# Patient Record
Sex: Female | Born: 1950 | Race: Black or African American | Hispanic: No | Marital: Married | Smoking: Former smoker
Health system: Southern US, Community
[De-identification: ages and names within clinical notes are randomized; demographics above are authoritative.]

## PROBLEM LIST (undated history)

## (undated) DIAGNOSIS — R0600 Dyspnea, unspecified: Secondary | ICD-10-CM

## (undated) DIAGNOSIS — I10 Essential (primary) hypertension: Secondary | ICD-10-CM

## (undated) DIAGNOSIS — E109 Type 1 diabetes mellitus without complications: Secondary | ICD-10-CM

## (undated) DIAGNOSIS — I509 Heart failure, unspecified: Secondary | ICD-10-CM

## (undated) DIAGNOSIS — R32 Unspecified urinary incontinence: Secondary | ICD-10-CM

## (undated) DIAGNOSIS — F419 Anxiety disorder, unspecified: Secondary | ICD-10-CM

## (undated) DIAGNOSIS — N189 Chronic kidney disease, unspecified: Secondary | ICD-10-CM

## (undated) DIAGNOSIS — K219 Gastro-esophageal reflux disease without esophagitis: Secondary | ICD-10-CM

## (undated) DIAGNOSIS — N390 Urinary tract infection, site not specified: Secondary | ICD-10-CM

## (undated) DIAGNOSIS — E119 Type 2 diabetes mellitus without complications: Secondary | ICD-10-CM

## (undated) DIAGNOSIS — K5900 Constipation, unspecified: Secondary | ICD-10-CM

## (undated) DIAGNOSIS — I639 Cerebral infarction, unspecified: Secondary | ICD-10-CM

## (undated) DIAGNOSIS — I499 Cardiac arrhythmia, unspecified: Secondary | ICD-10-CM

## (undated) DIAGNOSIS — IMO0002 Reserved for concepts with insufficient information to code with codable children: Secondary | ICD-10-CM

## (undated) DIAGNOSIS — IMO0001 Reserved for inherently not codable concepts without codable children: Secondary | ICD-10-CM

## (undated) HISTORY — DX: Cardiac arrhythmia, unspecified: I49.9

## (undated) HISTORY — PX: HAND SURGERY: SHX662

## (undated) HISTORY — DX: Type 1 diabetes mellitus without complications: E10.9

## (undated) HISTORY — DX: Heart failure, unspecified: I50.9

## (undated) HISTORY — DX: Gastro-esophageal reflux disease without esophagitis: K21.9

## (undated) HISTORY — DX: Chronic kidney disease, unspecified: N18.9

## (undated) HISTORY — DX: Anxiety disorder, unspecified: F41.9

## (undated) HISTORY — DX: Type 2 diabetes mellitus without complications: E11.9

## (undated) HISTORY — DX: Unspecified urinary incontinence: R32

## (undated) HISTORY — DX: Essential (primary) hypertension: I10

## (undated) HISTORY — DX: Cerebral infarction, unspecified: I63.9

## (undated) HISTORY — DX: Reserved for inherently not codable concepts without codable children: IMO0001

## (undated) HISTORY — PX: OTHER SURGICAL HISTORY: SHX169

## (undated) HISTORY — DX: Urinary tract infection, site not specified: N39.0

## (undated) HISTORY — DX: Reserved for concepts with insufficient information to code with codable children: IMO0002

## (undated) HISTORY — DX: Constipation, unspecified: K59.00

---

## 1983-03-05 HISTORY — PX: TUBAL LIGATION: SHX77

## 1997-03-04 HISTORY — PX: HIP SURGERY: SHX245

## 1999-03-05 DIAGNOSIS — I639 Cerebral infarction, unspecified: Secondary | ICD-10-CM

## 1999-03-05 HISTORY — DX: Cerebral infarction, unspecified: I63.9

## 2003-12-28 ENCOUNTER — Other Ambulatory Visit: Payer: Self-pay

## 2003-12-28 ENCOUNTER — Ambulatory Visit: Payer: Self-pay | Admitting: Specialist

## 2003-12-30 ENCOUNTER — Ambulatory Visit: Payer: Self-pay | Admitting: Specialist

## 2004-04-05 ENCOUNTER — Ambulatory Visit: Payer: Self-pay | Admitting: Specialist

## 2004-04-10 ENCOUNTER — Ambulatory Visit: Payer: Self-pay | Admitting: Specialist

## 2004-08-31 ENCOUNTER — Other Ambulatory Visit: Payer: Self-pay

## 2004-08-31 ENCOUNTER — Ambulatory Visit: Payer: Self-pay | Admitting: Specialist

## 2004-09-07 ENCOUNTER — Ambulatory Visit: Payer: Self-pay | Admitting: Specialist

## 2005-12-24 ENCOUNTER — Emergency Department: Payer: Self-pay | Admitting: General Practice

## 2007-08-06 ENCOUNTER — Ambulatory Visit: Payer: Self-pay | Admitting: Family Medicine

## 2008-03-29 ENCOUNTER — Emergency Department: Payer: Self-pay | Admitting: Emergency Medicine

## 2008-03-31 ENCOUNTER — Ambulatory Visit: Payer: Self-pay | Admitting: Emergency Medicine

## 2008-04-15 ENCOUNTER — Ambulatory Visit: Payer: Self-pay | Admitting: Family Medicine

## 2008-04-25 ENCOUNTER — Ambulatory Visit: Payer: Self-pay | Admitting: Family Medicine

## 2008-05-09 ENCOUNTER — Ambulatory Visit: Payer: Self-pay | Admitting: Family Medicine

## 2008-11-08 ENCOUNTER — Ambulatory Visit: Payer: Self-pay | Admitting: Family Medicine

## 2009-02-22 ENCOUNTER — Emergency Department: Payer: Self-pay | Admitting: Internal Medicine

## 2009-03-10 ENCOUNTER — Ambulatory Visit: Payer: Self-pay | Admitting: Specialist

## 2009-04-10 ENCOUNTER — Emergency Department: Payer: Self-pay | Admitting: Emergency Medicine

## 2009-10-19 ENCOUNTER — Ambulatory Visit: Payer: Self-pay | Admitting: Specialist

## 2009-11-26 ENCOUNTER — Inpatient Hospital Stay: Payer: Self-pay | Admitting: Internal Medicine

## 2009-12-09 ENCOUNTER — Inpatient Hospital Stay: Payer: Self-pay | Admitting: Internal Medicine

## 2009-12-15 ENCOUNTER — Ambulatory Visit: Payer: Self-pay | Admitting: Family

## 2010-01-24 ENCOUNTER — Ambulatory Visit: Payer: Self-pay | Admitting: Family

## 2010-02-08 ENCOUNTER — Ambulatory Visit: Payer: Self-pay | Admitting: Family Medicine

## 2010-02-22 ENCOUNTER — Ambulatory Visit: Payer: Self-pay | Admitting: Family

## 2010-09-11 ENCOUNTER — Inpatient Hospital Stay: Payer: Self-pay | Admitting: *Deleted

## 2011-09-09 IMAGING — CR DG CHEST 2V
1 series · 2 of 2 positions shown · non-contrast
Comparison: none

REASON FOR EXAM: f/u CHF
COMMENTS:

[Series 1: view not recorded · 0.17mm/px · 2 of 2 slices shown]
[im 1/2]
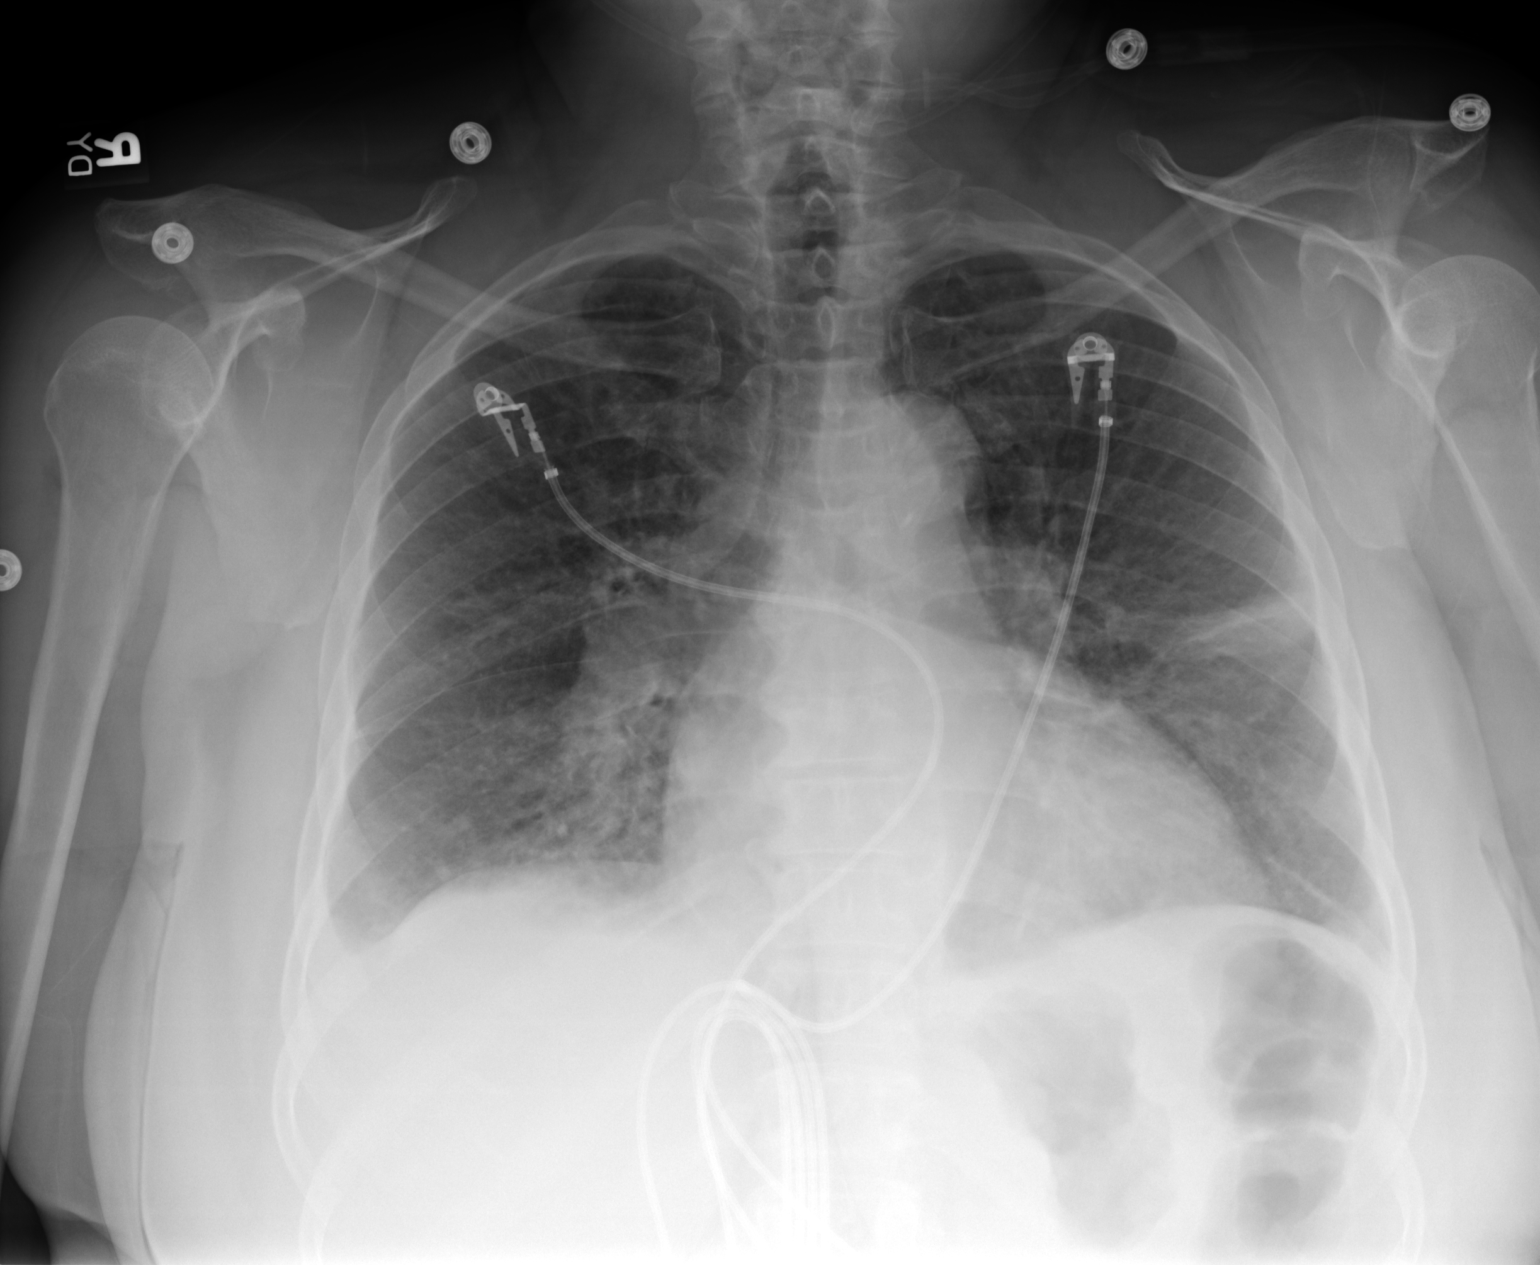
[im 2/2]
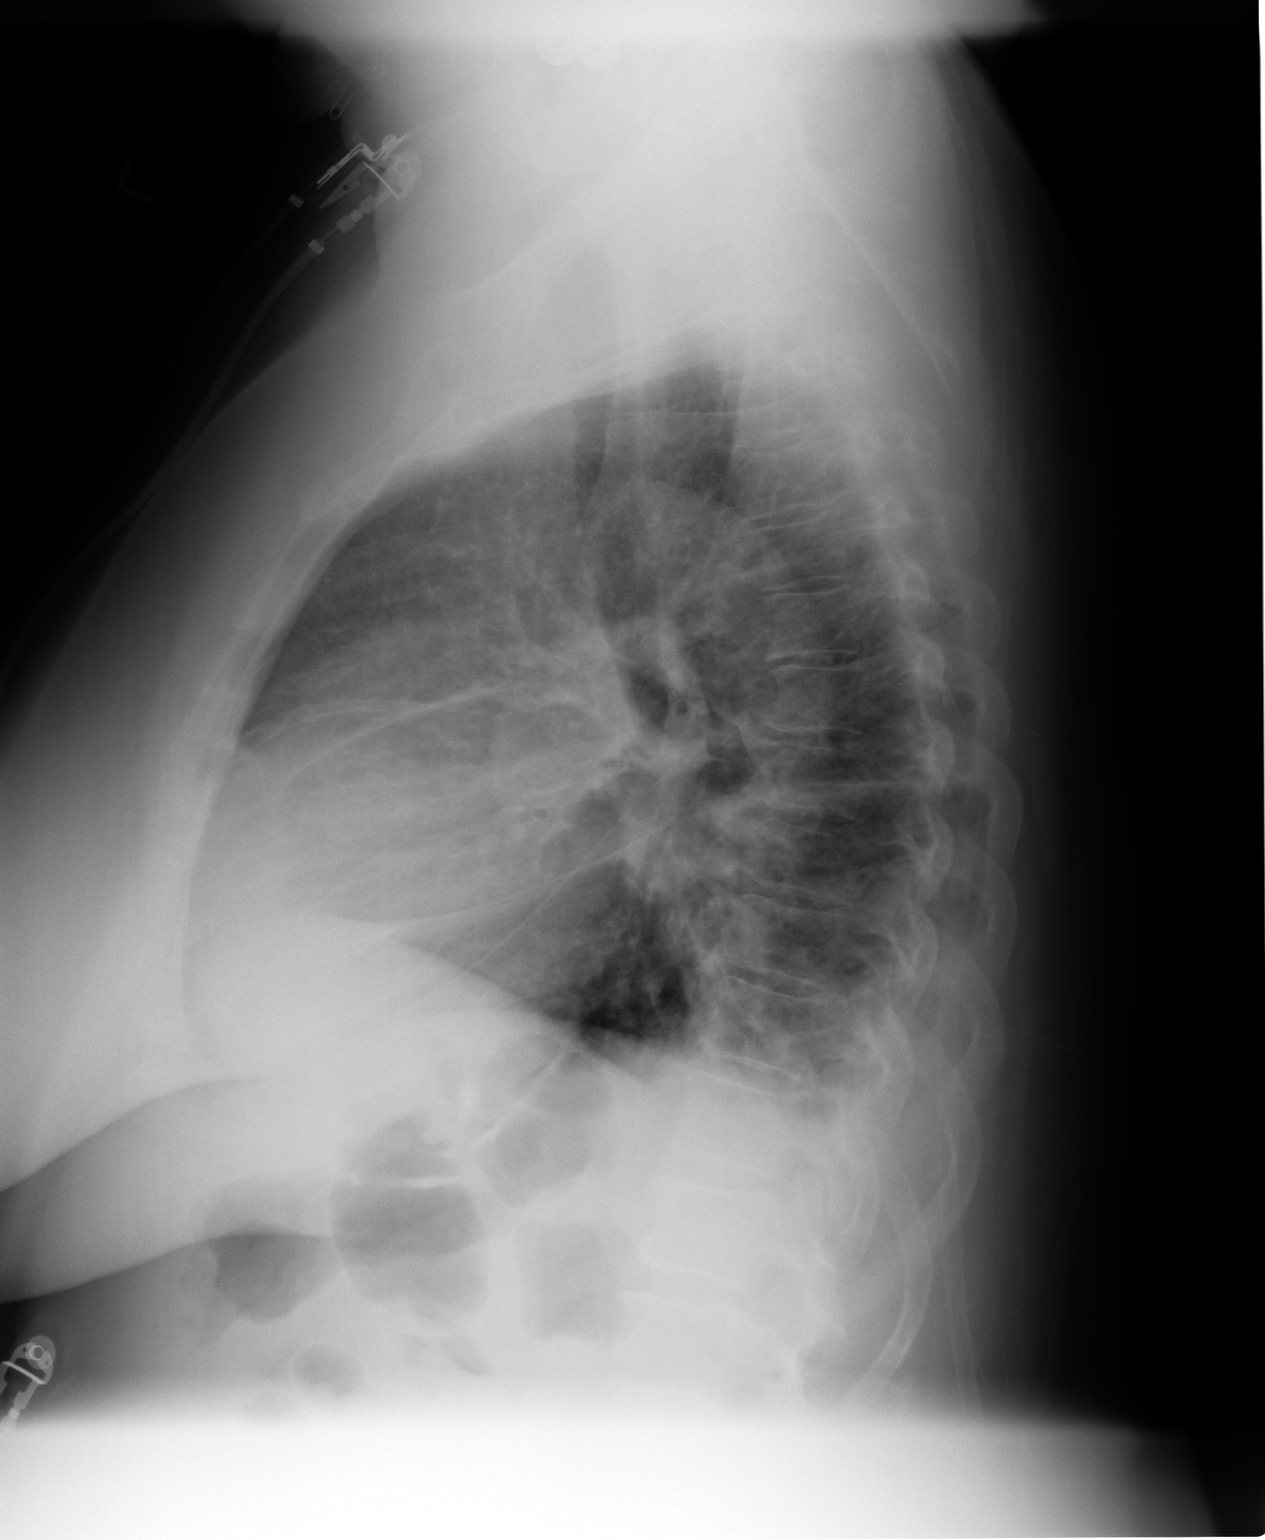

[2 of 2 positions shown; findings below may reference images not displayed]

PROCEDURE:     DXR - DXR CHEST PA (OR AP) AND LATERAL  - November 27, 2009  [DATE]

RESULT:     Comparison is made to the previous study of 11/26/2009.

There is persistent but improved pulmonary vascular congestion and pulmonary
edema. There is a band of density in the left mid lung which could represent
atelectasis or infiltrate. There is no significant effusion. There is
blunting of the posterior gutters and costophrenic angles with increased
density in the retrocardiac region on the lateral view.
IMPRESSION: Pulmonary edema is improving. There are trace pleural
effusions. There is right lower lobe and superior segment left lower lobe
infiltrate present.

## 2011-10-05 ENCOUNTER — Emergency Department: Payer: Self-pay | Admitting: Emergency Medicine

## 2012-03-04 HISTORY — PX: MULTIPLE TOOTH EXTRACTIONS: SHX2053

## 2012-06-16 ENCOUNTER — Ambulatory Visit: Payer: Self-pay | Admitting: Family Medicine

## 2012-06-24 IMAGING — CR DG FOOT COMPLETE 3+V*L*
1 series · 3 of 3 positions shown · non-contrast
Comparison: none

REASON FOR EXAM: pain plantar aspect left  foot , supratherapeutic INR
COMMENTS:

PROCEDURE:     DXR - DXR FOOT LT COMP W/OBLIQUES  - September 12, 2010  [DATE]
RESULT:     No fracture, dislocation or other acute bony abnormality is
identified. In the lateral view, there are incidentally noted small plantar
and Achilles calcaneal spurs.

[Series 1: view not recorded · 0.17mm/px · 3 of 3 slices shown]
[im 1/3]
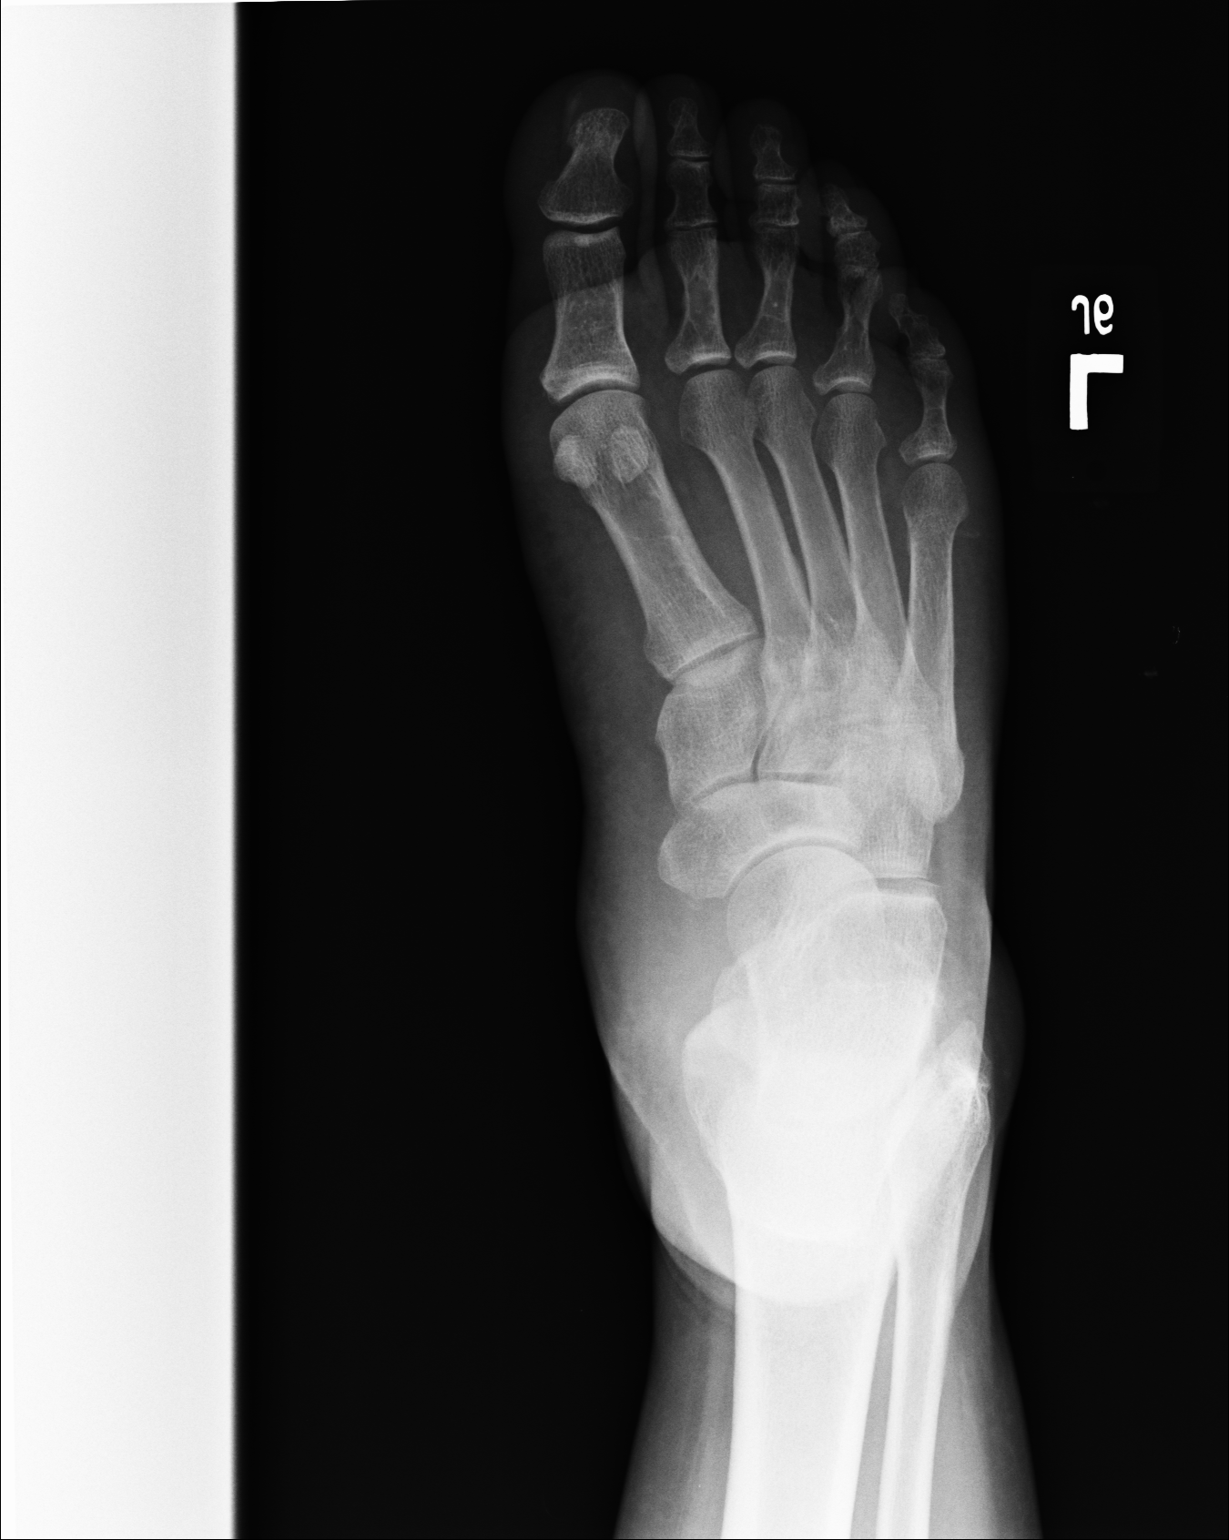
[im 2/3]
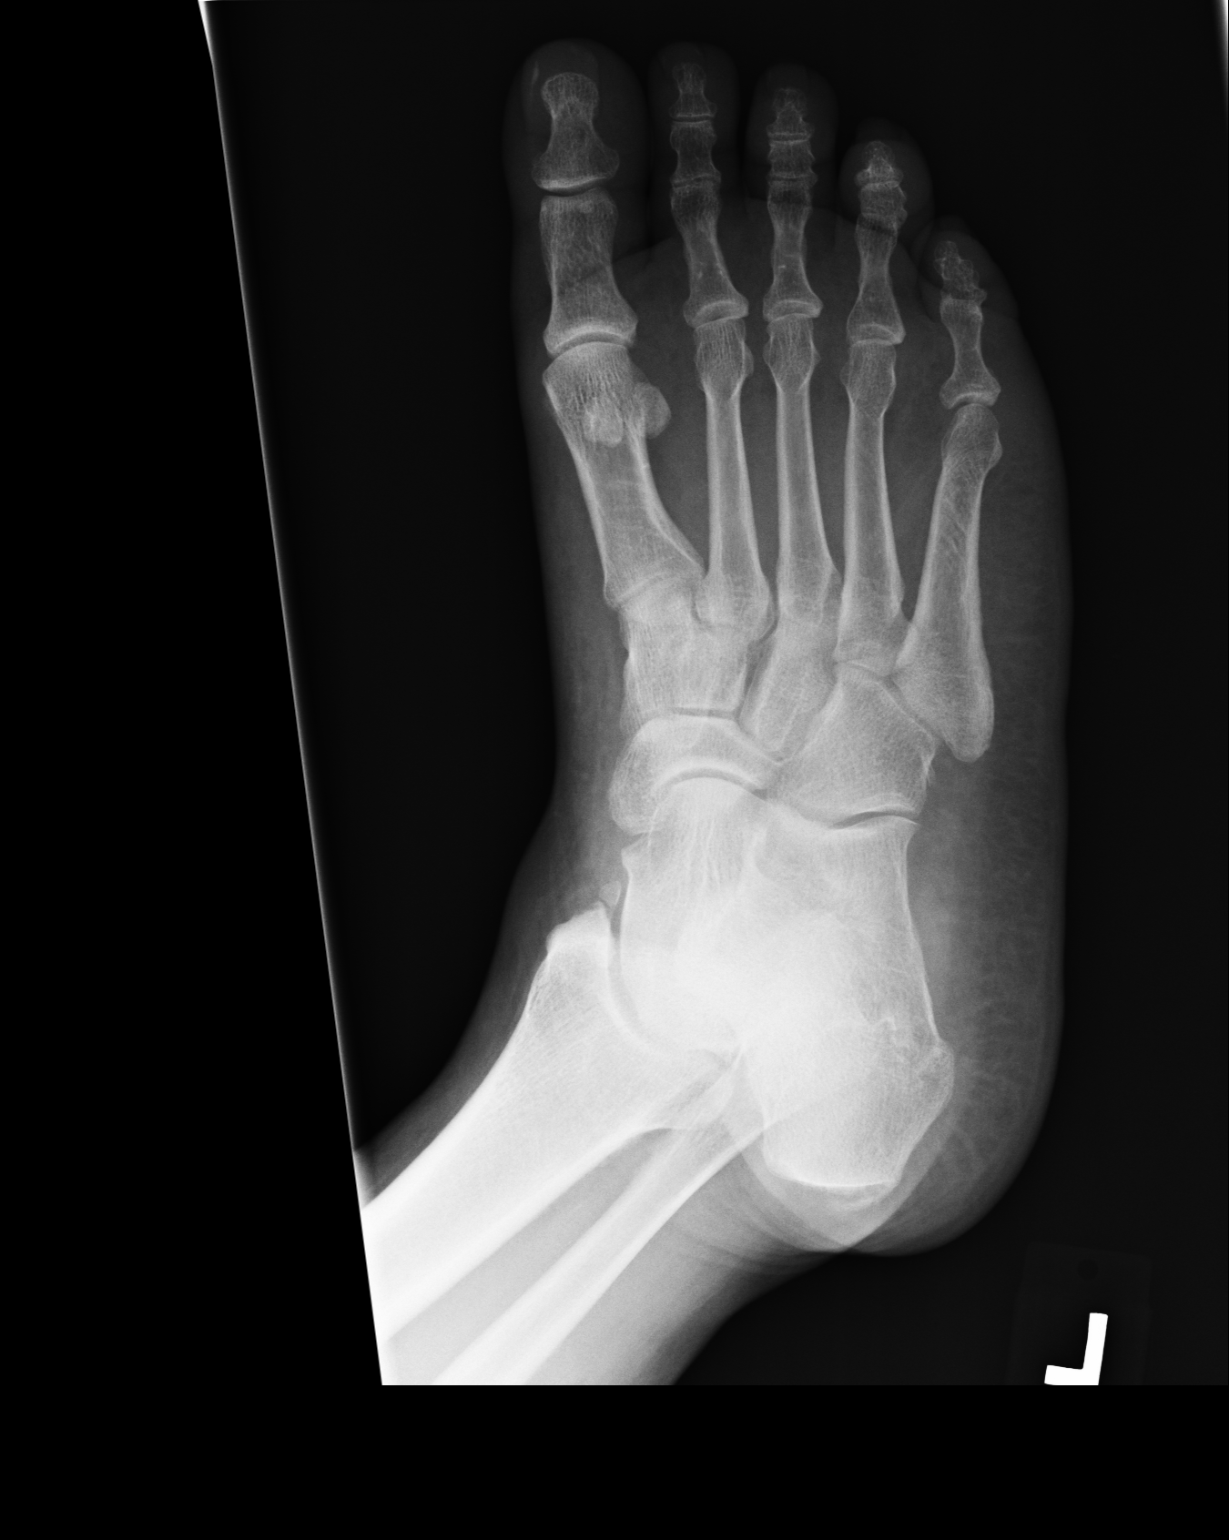
[im 3/3]
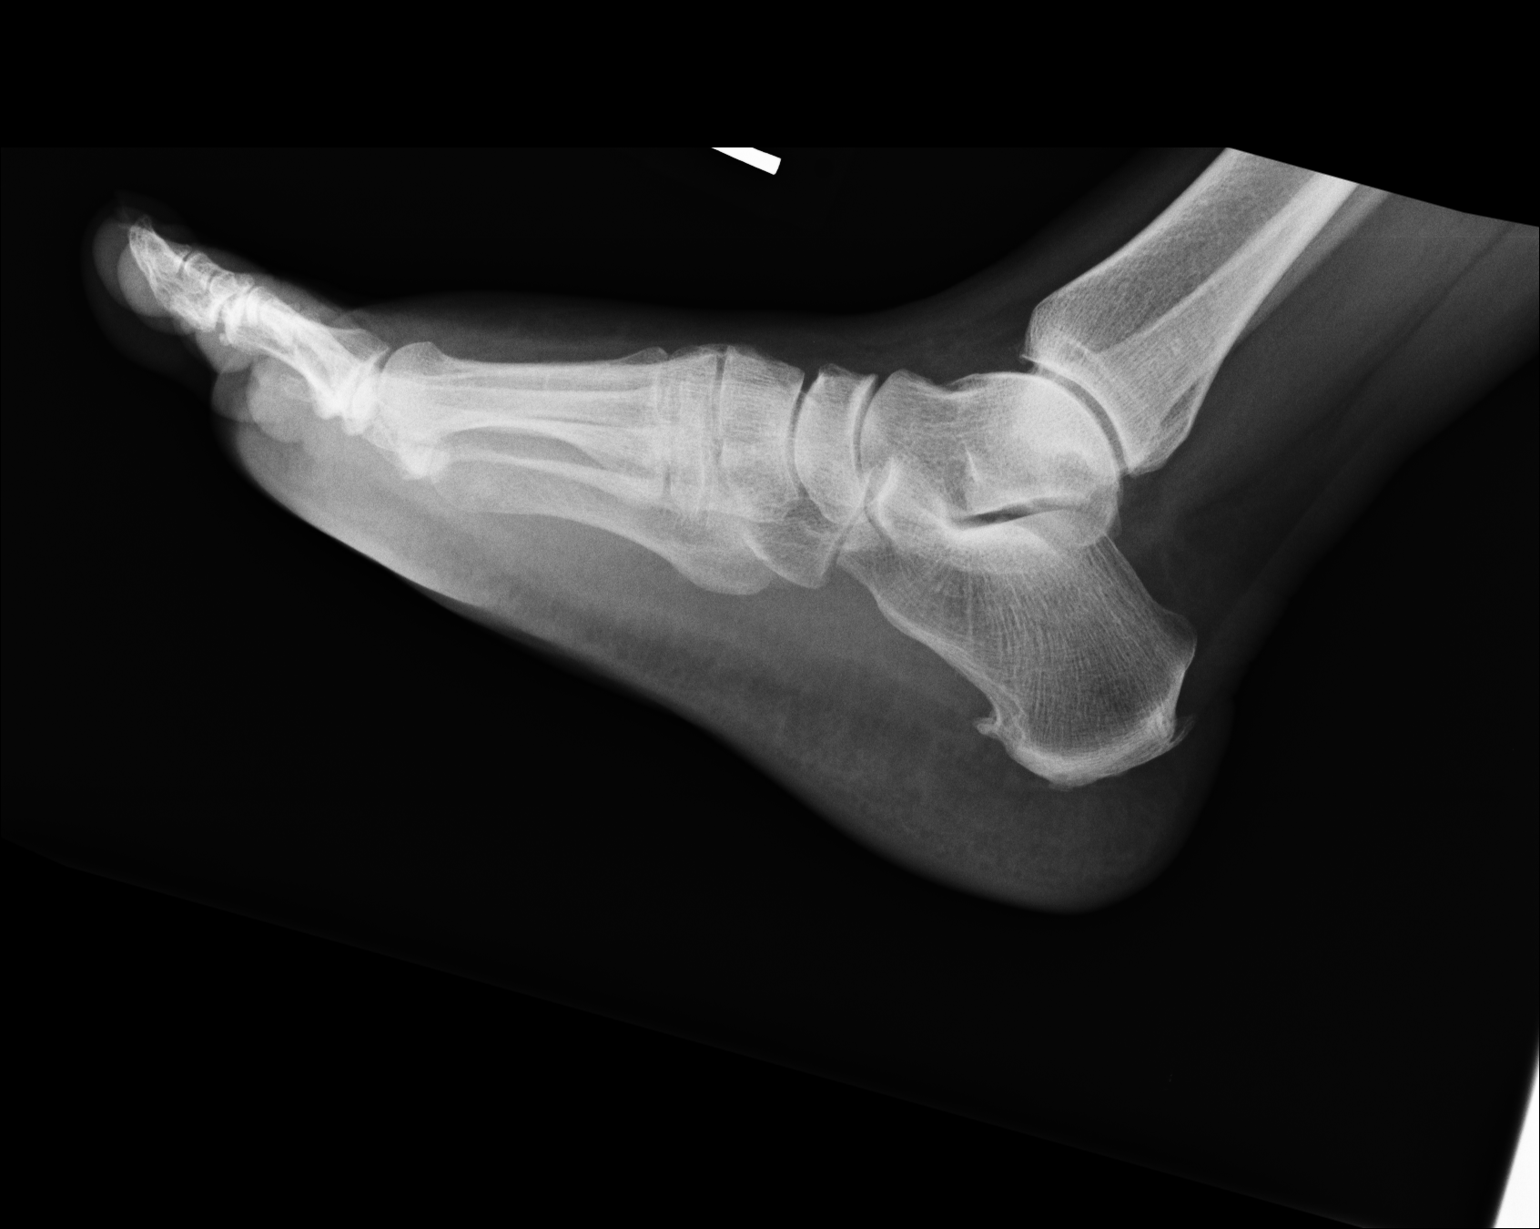

[3 of 3 positions shown; findings below may reference images not displayed]

IMPRESSION: 1.  No fracture or other acute bony abnormality is identified.
2.  There are noted plantar and Achilles calcaneal spurs.

## 2012-07-01 ENCOUNTER — Ambulatory Visit: Payer: Self-pay | Admitting: Family Medicine

## 2012-12-01 ENCOUNTER — Encounter: Payer: Self-pay | Admitting: General Surgery

## 2012-12-23 ENCOUNTER — Encounter: Payer: Self-pay | Admitting: General Surgery

## 2012-12-23 ENCOUNTER — Ambulatory Visit (INDEPENDENT_AMBULATORY_CARE_PROVIDER_SITE_OTHER): Payer: Medicare Other | Admitting: General Surgery

## 2012-12-23 VITALS — BP 126/82 | HR 78 | Resp 14 | Ht 60.0 in | Wt 232.0 lb

## 2012-12-23 DIAGNOSIS — Z1211 Encounter for screening for malignant neoplasm of colon: Secondary | ICD-10-CM | POA: Insufficient documentation

## 2012-12-23 DIAGNOSIS — K625 Hemorrhage of anus and rectum: Secondary | ICD-10-CM | POA: Insufficient documentation

## 2012-12-23 MED ORDER — POLYETHYLENE GLYCOL 3350 17 GM/SCOOP PO POWD: ORAL | Status: DC

## 2012-12-23 NOTE — Progress Notes (Deleted)
Patient ID: Shifa Brisbon, female   DOB: [birth year], 62 y.o.   MRN: [id number]  Chief Complaint  Patient presents with  . Other    new patient evaluation for screening colonoscopy    HPI Arnola Crittendon is a 62 y.o. female who presents for an evaluation for a screening colonoscopy.   HPI  Past Medical History  Diagnosis Date  . Stroke   . CHF (congestive heart failure)   . Diabetes mellitus without complication   . Hypertension   . Chronic kidney disease     Past Surgical History  Procedure Laterality Date  . Tubal ligation  1985  . Hand surgery Bilateral [id number]    carpel tunnel    History reviewed. No pertinent family history.  Social History History  Substance Use Topics  . Smoking status: Former Smoker -- 20 years  . Smokeless tobacco: Not on file  . Alcohol Use: No    Allergies  Allergen Reactions  . Pravastatin Itching    Per patient "itching on the bottom of feet"    Current Outpatient Prescriptions  Medication Sig Dispense Refill  . amLODipine (NORVASC) 10 MG tablet Take 1 tablet by mouth daily.      Marland Kitchen atorvastatin (LIPITOR) 20 MG tablet Take 1 tablet by mouth daily.      . baclofen (LIORESAL) 10 MG tablet Take 1 tablet by mouth 3 (three) times daily.      . benazepril (LOTENSIN) 20 MG tablet Take 1 tablet by mouth daily.      Marland Kitchen buPROPion (ZYBAN) 150 MG 12 hr tablet Take 2 tablets by mouth daily.      . Calcium Carbonate-Vitamin D (CALCIUM + D PO) Take 2 tablets by mouth daily.      . carvedilol (COREG) 25 MG tablet Take 1 tablet by mouth 2 (two) times daily.      . citalopram (CELEXA) 40 MG tablet Take 1 tablet by mouth daily.      . cloNIDine (CATAPRES) 0.2 MG tablet Take 1 tablet by mouth daily.      . furosemide (LASIX) 20 MG tablet Take 1 tablet by mouth daily.      Marland Kitchen glipiZIDE (GLUCOTROL) 5 MG tablet Take 1 tablet by mouth daily.      Marland Kitchen omeprazole (PRILOSEC) 20 MG capsule Take 2 capsules by mouth daily.      . potassium chloride SA (K-DUR,KLOR-CON) 20  MEQ tablet Take 1 tablet by mouth daily.      Marland Kitchen PROAIR HFA 108 (90 BASE) MCG/ACT inhaler Inhale 2 puffs into the lungs every 8 (eight) hours.      Marland Kitchen QUEtiapine (SEROQUEL) 50 MG tablet Take 1 tablet by mouth daily.      Marland Kitchen warfarin (COUMADIN) 4 MG tablet Take 2 tablets by mouth daily.       No current facility-administered medications for this visit.    Review of Systems Review of Systems  Constitutional: Negative.   Respiratory: Negative.   Cardiovascular: Negative.   Gastrointestinal: Negative.     There were no vitals taken for this visit.  Physical Exam Physical Exam  Data Reviewed ***  Assessment    ***    Plan    ***       Darnelle Catalan 12/23/2012, 4:06 PM

## 2012-12-23 NOTE — Patient Instructions (Addendum)
Colonoscopy A colonoscopy is an exam to evaluate your entire colon. In this exam, your colon is cleansed. A long fiberoptic tube is inserted through your rectum and into your colon. The fiberoptic scope (endoscope) is a long bundle of enclosed and very flexible fibers. These fibers transmit light to the area examined and send images from that area to your caregiver. Discomfort is usually minimal. You may be given a drug to help you sleep (sedative) during or prior to the procedure. This exam helps to detect lumps (tumors), polyps, inflammation, and areas of bleeding. Your caregiver may also take a small piece of tissue (biopsy) that will be examined under a microscope. LET YOUR CAREGIVER KNOW ABOUT:   Allergies to food or medicine.  Medicines taken, including vitamins, herbs, eyedrops, over-the-counter medicines, and creams.  Use of steroids (by mouth or creams).  Previous problems with anesthetics or numbing medicines.  History of bleeding problems or blood clots.  Previous surgery.  Other health problems, including diabetes and kidney problems.  Possibility of pregnancy, if this applies. BEFORE THE PROCEDURE   A clear liquid diet may be required for 2 days before the exam.  Ask your caregiver about changing or stopping your regular medications.  Liquid injections (enemas) or laxatives may be required.  A large amount of electrolyte solution may be given to you to drink over a short period of time. This solution is used to clean out your colon.  You should be present 60 minutes prior to your procedure or as directed by your caregiver. AFTER THE PROCEDURE   If you received a sedative or pain relieving medication, you will need to arrange for someone to drive you home.  Occasionally, there is a little blood passed with the first bowel movement. Do not be concerned. FINDING OUT THE RESULTS OF YOUR TEST Not all test results are available during your visit. If your test results are  not back during the visit, make an appointment with your caregiver to find out the results. Do not assume everything is normal if you have not heard from your caregiver or the medical facility. It is important for you to follow up on all of your test results. HOME CARE INSTRUCTIONS   It is not unusual to pass moderate amounts of gas and experience mild abdominal cramping following the procedure. This is due to air being used to inflate your colon during the exam. Walking or a warm pack on your belly (abdomen) may help.  You may resume all normal meals and activities after sedatives and medicines have worn off.  Only take over-the-counter or prescription medicines for pain, discomfort, or fever as directed by your caregiver. Do not use aspirin or blood thinners if a biopsy was taken. Consult your caregiver for medicine usage if biopsies were taken. SEEK IMMEDIATE MEDICAL CARE IF:   You have a fever.  You pass large blood clots or fill a toilet with blood following the procedure. This may also occur 10 to 14 days following the procedure. This is more likely if a biopsy was taken.  You develop abdominal pain that keeps getting worse and cannot be relieved with medicine. Document Released: 02/16/2000 Document Revised: 05/13/2011 Document Reviewed: 10/01/2007 [facility] Patient Information 2014 [place], [place].  Patient has been scheduled for a colonoscopy on 02-16-13 at [facility]. This patient has been asked to hold glucotrol day of prep and procedure. She will also discontinue coumadin 5 days prior to procedure.

## 2012-12-23 NOTE — Progress Notes (Signed)
Patient ID: Kathryn Kramer, female   DOB: [birth year], 62 y.o.   MRN: [id number]  Chief Complaint  Patient presents with  . Other    new patient evaluation for screening colonoscopy    HPI Kathryn Kramer is a 62 y.o. female here today for a evaluation of an colonoscopy screening. Patient states she had a colonoscopy 10 years ago and no polyps was found at that time. No GI problems. Her father died from colon cancer at age 1. The patient has brothers and a daughter with a history of colon polyps. She has noticed blood in the bowel as well as wiping.  The patient was accompanied today by her daughter,Kathryn Kramer, CMA for Hospice was present for the interview and exam. The patient reports that she discontinue her Coumadin therapy for general tractions earlier this year without ill effect. The patient was placed on long-term anticoagulation after placement of femoral stents in 2001 by Ok Anis,, M.D. The patient has had no further difficulty with her graft.  She was admitted for congestive heart failure 3 years ago but reports no recurrent episodes.  HPI  Past Medical History  Diagnosis Date  . CHF (congestive heart failure)   . Diabetes mellitus without complication   . Hypertension   . Chronic kidney disease   . Stroke 2001    Past Surgical History  Procedure Laterality Date  . Tubal ligation  1985  . Hand surgery Bilateral [id number]    carpel tunnel  . Colonoscopy      ip   . Hip surgery Right 1999  . Multiple tooth extractions  2014    Family History  Problem Relation Age of Onset  . Colon cancer Father   . Colon polyps Brother   . Colon polyps Daughter     Social History History  Substance Use Topics  . Smoking status: Former Smoker -- 1.00 packs/day for 20 years  . Smokeless tobacco: Never Used  . Alcohol Use: No    Allergies  Allergen Reactions  . Pravastatin Itching    Per patient "itching on the bottom of feet"    Current Outpatient Prescriptions   Medication Sig Dispense Refill  . amLODipine (NORVASC) 10 MG tablet Take 1 tablet by mouth daily.      Marland Kitchen atorvastatin (LIPITOR) 20 MG tablet Take 1 tablet by mouth daily.      . baclofen (LIORESAL) 10 MG tablet Take 1 tablet by mouth 3 (three) times daily.      . benazepril (LOTENSIN) 20 MG tablet Take 1 tablet by mouth daily.      Marland Kitchen buPROPion (ZYBAN) 150 MG 12 hr tablet Take 2 tablets by mouth daily.      . Calcium Carbonate-Vitamin D (CALCIUM + D PO) Take 2 tablets by mouth daily.      . carvedilol (COREG) 25 MG tablet Take 1 tablet by mouth 2 (two) times daily.      . citalopram (CELEXA) 40 MG tablet Take 1 tablet by mouth daily.      . cloNIDine (CATAPRES) 0.2 MG tablet Take 1 tablet by mouth daily.      . furosemide (LASIX) 20 MG tablet Take 1 tablet by mouth daily.      Marland Kitchen glipiZIDE (GLUCOTROL) 5 MG tablet Take 1 tablet by mouth daily.      Marland Kitchen omeprazole (PRILOSEC) 20 MG capsule Take 2 capsules by mouth daily.      . potassium chloride SA (K-DUR,KLOR-CON) 20 MEQ tablet Take 1 tablet by mouth  daily.      Marland Kitchen PROAIR HFA 108 (90 BASE) MCG/ACT inhaler Inhale 2 puffs into the lungs every 8 (eight) hours.      Marland Kitchen QUEtiapine (SEROQUEL) 50 MG tablet Take 1 tablet by mouth daily.      Marland Kitchen warfarin (COUMADIN) 4 MG tablet Take 2 tablets by mouth daily.      . polyethylene glycol powder (GLYCOLAX/MIRALAX) powder 255 grams one bottle for colonoscopy prep  255 g  0   No current facility-administered medications for this visit.    Review of Systems Review of Systems  Constitutional: Negative.   Respiratory: Negative.   Cardiovascular: Negative.     Blood pressure 126/82, pulse 78, resp. rate 14, height 5' (1.524 m), weight 232 lb (105.235 kg).  Physical Exam Physical Exam  Constitutional: She is oriented to person, place, and time. She appears well-developed and well-nourished.  Neck: No thyromegaly present.  Cardiovascular: Normal rate, regular rhythm and normal heart sounds.   No murmur  heard. Pulmonary/Chest: Effort normal and breath sounds normal.  Lymphadenopathy:    She has no cervical adenopathy.  Neurological: She is alert and oriented to person, place, and time.  Skin: Skin is warm and dry.    Data Reviewed PCP Notes.  Assessment    Strong family history for colonic polyps. Father with colon cancer.     Plan    The patient is a candidate for a screening exam. The risks of the procedure including those related to bleeding and perforation were discussed. Because of her long-term anticoagulation, it'll be necessary for the patient to discontinue her Coumadin prior to the procedure to minimize the risk of bleeding.       Patient has been scheduled for a colonoscopy on 02-16-13 at [facility]. This patient has been asked to hold glucotrol day of prep and procedure. She will also discontinue coumadin 5 days prior to procedure.   Earline Mayotte 12/23/2012, 8:59 PM

## 2013-01-25 DIAGNOSIS — E119 Type 2 diabetes mellitus without complications: Secondary | ICD-10-CM | POA: Insufficient documentation

## 2013-01-25 DIAGNOSIS — I779 Disorder of arteries and arterioles, unspecified: Secondary | ICD-10-CM | POA: Insufficient documentation

## 2013-01-25 DIAGNOSIS — I10 Essential (primary) hypertension: Secondary | ICD-10-CM

## 2013-01-25 DIAGNOSIS — I739 Peripheral vascular disease, unspecified: Secondary | ICD-10-CM | POA: Insufficient documentation

## 2013-01-25 HISTORY — DX: Essential (primary) hypertension: I10

## 2013-02-10 ENCOUNTER — Telehealth: Payer: Self-pay | Admitting: *Deleted

## 2013-02-10 NOTE — Telephone Encounter (Signed)
Patient confirms no medication changes since last office visit. She was reminded about stopping her coumadin. This patient reports she already has Miralax prescription and will pre-register today. Patient instructed to call the office if she has further questions.

## 2013-02-11 ENCOUNTER — Other Ambulatory Visit: Payer: Self-pay | Admitting: General Surgery

## 2013-02-11 DIAGNOSIS — Z1211 Encounter for screening for malignant neoplasm of colon: Secondary | ICD-10-CM

## 2013-02-16 ENCOUNTER — Ambulatory Visit: Payer: Self-pay | Admitting: General Surgery

## 2013-02-16 DIAGNOSIS — D129 Benign neoplasm of anus and anal canal: Secondary | ICD-10-CM

## 2013-02-16 DIAGNOSIS — D128 Benign neoplasm of rectum: Secondary | ICD-10-CM

## 2013-02-16 HISTORY — PX: COLONOSCOPY: SHX174

## 2013-02-18 ENCOUNTER — Encounter: Payer: Self-pay | Admitting: General Surgery

## 2013-02-18 LAB — PATHOLOGY REPORT

## 2013-02-19 ENCOUNTER — Telehealth: Payer: Self-pay | Admitting: *Deleted

## 2013-02-19 NOTE — Telephone Encounter (Signed)
Patient has been notified as instructed. This patient verbalizes understanding.Patient put in to recalls for 1 year.

## 2013-02-19 NOTE — Telephone Encounter (Signed)
Message copied by Levada Schilling on Fri Feb 19, 2013  8:08 AM ------      Message from: [place], Merrily Pew      Created: Fri Feb 19, 2013  7:24 AM       Please notify the patient that the polyps were removed during her last exam were all benign. No cancer. Because she had so many, we need to repeat this examination in one year. ------

## 2013-02-22 ENCOUNTER — Encounter: Payer: Self-pay | Admitting: General Surgery

## 2013-02-22 ENCOUNTER — Telehealth: Payer: Self-pay | Admitting: *Deleted

## 2013-02-22 NOTE — Telephone Encounter (Signed)
Patient placed in December 2015 recalls. The need for double prep has been documented accordingly.

## 2013-02-22 NOTE — Telephone Encounter (Signed)
Message copied by Nicholes Mango on Mon Feb 22, 2013  9:56 AM ------      Message from: [place], Merrily Pew      Created: Fri Feb 19, 2013  7:25 AM       Please put the patient in for a follow up colonoscopy in one year. Making of dictation that she will need to use a double prep. ------

## 2013-08-04 DIAGNOSIS — I509 Heart failure, unspecified: Secondary | ICD-10-CM | POA: Insufficient documentation

## 2013-08-04 HISTORY — DX: Heart failure, unspecified: I50.9

## 2013-10-02 ENCOUNTER — Emergency Department: Payer: Self-pay | Admitting: Emergency Medicine

## 2013-10-03 LAB — URINALYSIS, COMPLETE
BILIRUBIN, UR: NEGATIVE
BLOOD: NEGATIVE
Glucose,UR: 50 mg/dL (ref 0–75)
Hyaline Cast: 29
Ketone: NEGATIVE
Nitrite: NEGATIVE
Ph: 5 (ref 4.5–8.0)
Protein: >=500
RBC,UR: 45 /HPF (ref 0–5)
Specific Gravity: 1.024 (ref 1.003–1.030)
Squamous Epithelial: 10
WBC UR: 667 /HPF (ref 0–5)

## 2013-10-03 LAB — CBC WITH DIFFERENTIAL/PLATELET
BASOS ABS: 0.1 10*3/uL (ref 0.0–0.1)
BASOS PCT: 1 %
Eosinophil #: 0.2 10*3/uL (ref 0.0–0.7)
Eosinophil %: 1.5 %
HCT: 37.2 % (ref 35.0–47.0)
HGB: 12 g/dL (ref 12.0–16.0)
LYMPHS PCT: 23.6 %
Lymphocyte #: 3.1 10*3/uL (ref 1.0–3.6)
MCH: 27.9 pg (ref 26.0–34.0)
MCHC: 32.2 g/dL (ref 32.0–36.0)
MCV: 87 fL (ref 80–100)
MONOS PCT: 7.8 %
Monocyte #: 1 x10 3/mm — ABNORMAL HIGH (ref 0.2–0.9)
Neutrophil #: 8.6 10*3/uL — ABNORMAL HIGH (ref 1.4–6.5)
Neutrophil %: 66.1 %
PLATELETS: 267 10*3/uL (ref 150–440)
RBC: 4.29 10*6/uL (ref 3.80–5.20)
RDW: 15.3 % — ABNORMAL HIGH (ref 11.5–14.5)
WBC: 12.9 10*3/uL — AB (ref 3.6–11.0)

## 2013-10-03 LAB — TROPONIN I: Troponin-I: <0.02 ng/mL

## 2013-10-03 LAB — BASIC METABOLIC PANEL
ANION GAP: 9 (ref 7–16)
BUN: 20 mg/dL — ABNORMAL HIGH (ref 7–18)
CALCIUM: 8.8 mg/dL (ref 8.5–10.1)
CHLORIDE: 108 mmol/L — AB (ref 98–107)
CO2: 24 mmol/L (ref 21–32)
Creatinine: 2.83 mg/dL — ABNORMAL HIGH (ref 0.60–1.30)
EGFR (African American): 20 — ABNORMAL LOW
EGFR (Non-African Amer.): 17 — ABNORMAL LOW
Glucose: 130 mg/dL — ABNORMAL HIGH (ref 65–99)
Osmolality: 286 (ref 275–301)
Potassium: 4 mmol/L (ref 3.5–5.1)
Sodium: 141 mmol/L (ref 136–145)

## 2013-10-05 LAB — URINE CULTURE

## 2014-01-03 ENCOUNTER — Encounter: Payer: Self-pay | Admitting: General Surgery

## 2014-02-21 ENCOUNTER — Ambulatory Visit (INDEPENDENT_AMBULATORY_CARE_PROVIDER_SITE_OTHER): Payer: Medicare Other | Admitting: General Surgery

## 2014-02-21 ENCOUNTER — Encounter: Payer: Self-pay | Admitting: General Surgery

## 2014-02-21 VITALS — BP 130/82 | HR 84 | Resp 16 | Ht 60.0 in | Wt 223.0 lb

## 2014-02-21 DIAGNOSIS — Z860100 Personal history of colon polyps, unspecified: Secondary | ICD-10-CM | POA: Insufficient documentation

## 2014-02-21 MED ORDER — POLYETHYLENE GLYCOL 3350 17 GM/SCOOP PO POWD: ORAL | Status: DC

## 2014-02-21 NOTE — Progress Notes (Signed)
Patient ID: Kathryn Kramer, female   DOB: 1950-09-07, 63 y.o.   MRN: [id number]  Chief Complaint  Patient presents with  . Other    screening colonoscopy    HPI Kathryn Kramer is a 63 y.o. female here today for a evaluation of a screening colonoscopy. Patient states no GI problems at this time. Last colonoscopy was done on  02/16/13. The patient had a poor prep at that time, but in spite of this multiple polyps were identified.  No further reports of rectal bleeding.  The patient reports she's had no difficulty with shortness of breath or lower extremity swelling. She has not been weighing daily as previously requested by the cardiology service. The patient reports her blood sugars been under good control, she is checking twice a day.  She is accompanied today by her daughter, a CNA at hospice. HPI  Past Medical History  Diagnosis Date  . CHF (congestive heart failure)   . Diabetes mellitus without complication   . Hypertension   . Chronic kidney disease   . Stroke 2001    Past Surgical History  Procedure Laterality Date  . Tubal ligation  1985  . Hand surgery Bilateral [id number]    carpel tunnel  . Colonoscopy  02/16/13  . Hip surgery Right 1999  . Multiple tooth extractions  2014    Family History  Problem Relation Age of Onset  . Colon cancer Father   . Colon polyps Brother   . Colon polyps Daughter     Social History History  Substance Use Topics  . Smoking status: Former Smoker -- 1.00 packs/day for 20 years  . Smokeless tobacco: Never Used  . Alcohol Use: No    Allergies  Allergen Reactions  . Pravastatin Itching    Per patient "itching on the bottom of feet"    Current Outpatient Prescriptions  Medication Sig Dispense Refill  . amLODipine (NORVASC) 10 MG tablet Take 1 tablet by mouth daily.    Marland Kitchen aspirin 81 MG tablet Take 81 mg by mouth daily.    Marland Kitchen atorvastatin (LIPITOR) 20 MG tablet Take 1 tablet by mouth daily.    . baclofen (LIORESAL) 10 MG tablet  Take 1 tablet by mouth 3 (three) times daily.    . benazepril (LOTENSIN) 20 MG tablet Take 1 tablet by mouth daily.    Marland Kitchen buPROPion (WELLBUTRIN SR) 150 MG 12 hr tablet     . Calcium Carbonate-Vitamin D (CALCIUM + D PO) Take 2 tablets by mouth daily.    . carvedilol (COREG) 12.5 MG tablet Take 12.5 mg by mouth 2 (two) times daily with a meal.    . citalopram (CELEXA) 40 MG tablet Take 1 tablet by mouth daily.    . cloNIDine (CATAPRES) 0.3 MG tablet Take 0.3 mg by mouth 2 (two) times daily.    . furosemide (LASIX) 20 MG tablet Take 1 tablet by mouth daily.    Marland Kitchen glipiZIDE (GLUCOTROL) 5 MG tablet Take 1 tablet by mouth daily.    . insulin glargine (LANTUS) 100 UNIT/ML injection Inject 20 Units into the skin daily.    Marland Kitchen omeprazole (PRILOSEC) 20 MG capsule Take 2 capsules by mouth daily.    Marland Kitchen PROAIR HFA 108 (90 BASE) MCG/ACT inhaler Inhale 2 puffs into the lungs every 8 (eight) hours.    Marland Kitchen QUEtiapine (SEROQUEL) 50 MG tablet Take 1 tablet by mouth daily.    Marland Kitchen warfarin (COUMADIN) 4 MG tablet Take 2 tablets by mouth daily.    Marland Kitchen  polyethylene glycol powder (GLYCOLAX/MIRALAX) powder 255 grams one bottle for colonoscopy prep 255 g 0   No current facility-administered medications for this visit.    Review of Systems Review of Systems  Constitutional: Negative.   Respiratory: Negative.   Cardiovascular: Negative.   Gastrointestinal: Negative.     Blood pressure 130/82, pulse 84, resp. rate 16, height 5' (1.524 m), weight 223 lb (101.152 kg).  Physical Exam Physical Exam  Constitutional: She is oriented to person, place, and time. She appears well-developed and well-nourished.  Eyes: Conjunctivae are normal. No scleral icterus.  Neck: Neck supple.  Cardiovascular: Normal rate and normal heart sounds.   No lower extremity edema is noted.  Pulmonary/Chest: Effort normal and breath sounds normal.  Abdominal: Soft. Bowel sounds are normal. There is no tenderness.  Lymphadenopathy:    She has no  cervical adenopathy.  Neurological: She is alert and oriented to person, place, and time.  Skin: Skin is warm and dry.    Data Reviewed Multiple adenomas of the colon removed at the time of the December 2014 colonoscopy.  Assessment    Multiple colonic polyps.    Plan    Discussed colonoscopy follow-up as well as the need for better prep to be sure no additional lesions are identified..     Patient has been scheduled for a colonoscopy on 03-30-14 at [facility]. This patient has been asked to stop coumadin 5 days prior to procedure. She has been asked to make use of a two day clear liquid prep. Patient will hold glipizide for the 2 days of prep and day of procedure (okay to continue Lantus the days of prep) . She will also make use of Dulcolax 5 mg two pills each day of prep. Patient will hold all diabetic medications the day of procedure.   PCP:  Geraldine Contras 02/21/2014, 9:12 PM

## 2014-02-21 NOTE — Patient Instructions (Addendum)
Colonoscopy A colonoscopy is an exam to look at the entire large intestine (colon). This exam can help find problems such as tumors, polyps, inflammation, and areas of bleeding. The exam takes about 1 hour.  LET [facility] CARE PROVIDER KNOW ABOUT:   Any allergies you have.  All medicines you are taking, including vitamins, herbs, eye drops, creams, and over-the-counter medicines.  Previous problems you or members of your family have had with the use of anesthetics.  Any blood disorders you have.  Previous surgeries you have had.  Medical conditions you have. RISKS AND COMPLICATIONS  Generally, this is a safe procedure. However, as with any procedure, complications can occur. Possible complications include:  Bleeding.  Tearing or rupture of the colon wall.  Reaction to medicines given during the exam.  Infection (rare). BEFORE THE PROCEDURE   Ask your health care provider about changing or stopping your regular medicines.  You may be prescribed an oral bowel prep. This involves drinking a large amount of medicated liquid, starting the day before your procedure. The liquid will cause you to have multiple loose stools until your stool is almost clear or light green. This cleans out your colon in preparation for the procedure.  Do not eat or drink anything else once you have started the bowel prep, unless your health care provider tells you it is safe to do so.  Arrange for someone to drive you home after the procedure. PROCEDURE   You will be given medicine to help you relax (sedative).  You will lie on your side with your knees bent.  A long, flexible tube with a light and camera on the end (colonoscope) will be inserted through the rectum and into the colon. The camera sends video back to a computer screen as it moves through the colon. The colonoscope also releases carbon dioxide gas to inflate the colon. This helps your health care provider see the area better.  During  the exam, your health care provider may take a small tissue sample (biopsy) to be examined under a microscope if any abnormalities are found.  The exam is finished when the entire colon has been viewed. AFTER THE PROCEDURE   Do not drive for 24 hours after the exam.  You may have a small amount of blood in your stool.  You may pass moderate amounts of gas and have mild abdominal cramping or bloating. This is caused by the gas used to inflate your colon during the exam.  Ask when your test results will be ready and how you will get your results. Make sure you get your test results. Document Released: 02/16/2000 Document Revised: 12/09/2012 Document Reviewed: 10/26/2012 [facility] Patient Information 2015 [place], [place]. This information is not intended to replace advice given to you by your health care provider. Make sure you discuss any questions you have with your health care provider.  Patient has been scheduled for a colonoscopy on 03-30-14 at [facility]. This patient has been asked to stop coumadin 5 days prior to procedure. She has been asked to make use of a two day clear liquid prep. Patient will hold glipizide for the 2 days of prep and day of procedure (okay to continue Lantus the days of prep) . She will also make use of Dulcolax 5 mg two pills each day of prep. Patient will hold all diabetic medications the day of procedure.

## 2014-02-25 ENCOUNTER — Other Ambulatory Visit: Payer: Self-pay | Admitting: General Surgery

## 2014-02-25 DIAGNOSIS — Z86010 Personal history of colonic polyps: Secondary | ICD-10-CM

## 2014-03-21 ENCOUNTER — Telehealth: Payer: Self-pay | Admitting: *Deleted

## 2014-03-21 NOTE — Telephone Encounter (Signed)
Patient contacted today to confirm no medication changes since last office visit and she confirms no changes. This patient states she will pick up Miralax prescription later this week. Also, patient encouraged to pre-register no later than this Friday, 03-28-14. Patient states she will do this by phone.   Prep instructions also briefly reviewed regarding 2 day clear liquid prep, coumadin, and diabetic medications. Patient verbalizes understanding and was instructed to call the office should she have further questions.

## 2014-03-30 ENCOUNTER — Ambulatory Visit: Payer: Self-pay | Admitting: General Surgery

## 2014-03-30 ENCOUNTER — Other Ambulatory Visit: Payer: Self-pay | Admitting: General Surgery

## 2014-03-30 ENCOUNTER — Encounter: Payer: Self-pay | Admitting: *Deleted

## 2014-03-30 DIAGNOSIS — Z86010 Personal history of colonic polyps: Secondary | ICD-10-CM

## 2014-03-30 NOTE — Progress Notes (Signed)
Patient present for colonoscopy today after 2 day prep of clear liquids as well as Dulcolax and Miralax. Large amount of stool, liquid and particulate (vegetable) matter reduced visualization by 50% or greater. Scope advance to proximal transverse colon prior to termination of procedure.  HEENT: NEG. Lungs: Clear. Cardio: RR. ABD: Obese, soft.    Option to reprep x 48 hours vs starting afresh reviewed with patient and son. She desired to continue on clear liquids, make use of Dulcolax 10 mg po BID ( 3 doses), repeat Miralax ( 255 gms in 2 Liters of liquids) and to have a repeat colonoscopy on April 01, 2014.  FSBS on arrival to endoscopy today was < 80.  She will decrease her PM Lantus from 20 units to 10 units tonight and tomorrow. She will not restart Glypizide or Warfarin at this time.  Arrangement made for SSE on admission to endoscopy on January 29th.

## 2014-03-31 ENCOUNTER — Encounter: Payer: Self-pay | Admitting: General Surgery

## 2014-04-01 ENCOUNTER — Ambulatory Visit: Payer: Self-pay | Admitting: General Surgery

## 2014-04-01 DIAGNOSIS — Z86010 Personal history of colonic polyps: Secondary | ICD-10-CM

## 2014-04-01 DIAGNOSIS — D121 Benign neoplasm of appendix: Secondary | ICD-10-CM

## 2014-04-04 ENCOUNTER — Encounter: Payer: Self-pay | Admitting: General Surgery

## 2014-04-08 ENCOUNTER — Telehealth: Payer: Self-pay

## 2014-04-08 ENCOUNTER — Encounter: Payer: Self-pay | Admitting: General Surgery

## 2014-04-08 NOTE — Telephone Encounter (Signed)
Notified patient as instructed, patient pleased. Discussed follow-up for repeat exam in 5 years, patient agrees. Patient placed in recalls.

## 2014-04-08 NOTE — Telephone Encounter (Signed)
-----   Message from Robert Bellow, MD sent at 04/08/2014 11:02 AM EST ----- Notify polyp removed last week was fine.  F/U exam in five years.   ----- Message -----    From: Darrin Nipper, CMA    Sent: 04/08/2014   9:40 AM      To: Robert Bellow, MD

## 2014-04-24 ENCOUNTER — Inpatient Hospital Stay: Payer: Self-pay | Admitting: Internal Medicine

## 2014-05-09 DIAGNOSIS — F17200 Nicotine dependence, unspecified, uncomplicated: Secondary | ICD-10-CM | POA: Insufficient documentation

## 2014-05-09 DIAGNOSIS — Z720 Tobacco use: Secondary | ICD-10-CM | POA: Insufficient documentation

## 2014-06-24 ENCOUNTER — Emergency Department
Admit: 2014-06-24 | Disposition: A | Discharge status: Home / Self Care | Payer: Self-pay | Admitting: Emergency Medicine

## 2014-06-24 LAB — URINALYSIS, COMPLETE
BILIRUBIN, UR: NEGATIVE
Glucose,UR: 50 mg/dL (ref 0–75)
Ketone: NEGATIVE
Nitrite: NEGATIVE
Ph: 6 (ref 4.5–8.0)
Protein: 30
Specific Gravity: 1.003 (ref 1.003–1.030)

## 2014-06-25 NOTE — H&P (Signed)
PATIENT NAME:  Kathryn, Kramer MR#:  [id number] DATE OF BIRTH:  [birth year]  DATE OF ADMISSION:  02/16/2013  ADMITTING DIAGNOSIS: Candidate for screening colonoscopy.   A 64 year old woman who had an no recent GI symptoms but a past history of cardiopulmonary disease. She had discontinued her Coumadin as requested and was admitted for a planned colonoscopy. No medication changes from her 12/23/2012 exam, documented in the chart.   PHYSICAL EXAMINATION: Examination showed the patient's vital signs to be within normal limits. Cardiopulmonary exam was unremarkable. Head and neck was unremarkable.   IMPRESSION: Candidate for screening colonoscopy.   ____________________________ Robert Bellow, MD jwb:sg D: 03/09/2013 09:25:14 ET T: 03/09/2013 09:35:56 ET JOB#: [id number]  cc: Robert Bellow, MD, <Dictator> Narcisa Ganesh Amedeo Kinsman MD ELECTRONICALLY SIGNED 03/10/2013 21:08

## 2014-06-27 LAB — SURGICAL PATHOLOGY

## 2014-07-03 NOTE — Discharge Summary (Signed)
PATIENT NAME:  Kathryn Kramer, Kathryn Kramer MR#:  [id number] DATE OF BIRTH:  [birth year]  DATE OF ADMISSION:  04/24/2014 DATE OF DISCHARGE:  04/27/2014  CONSULTATIONS: None.   DISCHARGE DIAGNOSES:  1.  Escherichia coli sepsis.  2.  Escherichia coli urinary tract infection. 3.  Chronic atrial fibrillation.  4.  Depression.  5.  Type 2 diabetes mellitus. 6.  Chronic kidney disease, stage III. 7.  Acute on chronic renal failure.   DISCHARGE MEDICATIONS:  1.  Benazepril 20 mg p.o. daily.  2.  Bupropion 150 mg p.o. b.i.d.  3.  Albuterol 90 mcg 1-2 puffs every 4-6 hours as needed.  4.  Omeprazole 20 mg p.o. b.i.d.  5.  Baclofen 10 mg p.o. t.i.d.  6.  Acetaminophen 325 mg 2 tablets every 4 hours as needed.  7.  Clonidine 0.3 mg p.o. b.i.d.  8.  Amlodipine 10 mg p.o. daily.  9.  Aspirin 81 mg p.o. daily.  10.  Celexa 40 mg p.o. daily.  11.  Glipizide 5 mg p.o. b.i.d. 12.  Atorvastatin 20 mg p.o. daily.  13.  Coumadin 4 mg, take 1 tablet on Monday and 2 tablets on other days.  14.  Atorvastatin daily 20 mg daily.  15.  Seroquel 50 mg daily. The patient is on Seroquel as scheduled and Celexa also scheduled.  16.  Coreg 12.5 mg p.o. b.i.d.  17.  Lantus 20 units once a day.  18.  MiraLax as needed for constipation.  19.  Oxybutynin 5 mg p.o. b.i.d.  20.  Clonazepam 0.5 mg p.o. b.i.d. as needed.  21.  The patient is given Omnicef 300 mg p.o. every 12 hours for 10 days; this is to cover sepsis due to Escherichia coli UTI.  22.  The patient advised to stop metformin, furosemide, and KCl.   DIET: Low-sodium, low-fat, ADA diet.  FOLLOWUP: The patient's primary doctor is Dr. Delight Stare; advised her to follow up with her in about a week.   HOSPITAL COURSE: A 64 year old female patient admitted for fever and unresponsiveness for 3-4 days. Refer to the history and physical for full details. The patient noted to have a temperature of 103 Fahrenheit in the Emergency Room.  1.  Admitted for sepsis secondary to  UTI. The patient's urinalysis showed 50 WBC, positive nitrite. The patient's white count was 14. Lactic acid was 1.4 on admission. The patient was started on IV fluids and also IV Rocephin for UTI. The patient's blood cultures and urine cultures were followed. Blood cultures showed Escherichia coli and urine cultures also showed more than 100,000 colonies of Escherichia coli sensitive to all the medications. The patient initially received Rocephin 1 gram IV daily, but she did have a fever up to 101.2 on the February 22, so  IV Rocephin has been changed to Zosyn and she is getting Zosyn 3.375 q. 8 hours from 23rd to 24th. The patient's symptoms nicely improved and she remained afebrile. Initially white count was 14 day, then her white count went up to 22, and then came back to 15.7 on February 22. The patient also received IV fluids for sepsis. She felt much better today, and I have discharged her to home with [facility] for 10 days.  2.  Acute on chronic renal failure, CKD, stage III. The patient had a creatinine of 3.14 and BUN of 13 on admission and it improved nicely with fluids and creatinine came back to 2.05 today. The patient does have CKD, stage III. She is not on  metformin and Lasix also was stopped  because of her dehydration and acute renal failure. She is on the 20 mg of benazepril for diabetic nephropathy.  3.  Hypertension. The patient's blood pressure is well controlled. She is on multiple medications including clonidine, amlodipine, and Coreg.  4.  The patient does have chronic atrial fibrillation and she is on Coumadin. The patient's INR is 2.2 today.   5.  The patient has diabetes mellitus, type 2. She is on Lantus and also glipizide, so we are continuing that. She has been advised to follow up with primary doctor regarding diabetic management.  6.  Depression. She is on Celexa and also clonazepam and Seroquel.   DISCHARGE PHYSICAL EXAMINATION: DISCHARGE VITAL SIGNS: Temperature 98.9, heart  rate 82, blood pressure 146/71, saturation 93% on room air. The patient's chest x-ray is clear without any infiltrate.  CARDIOVASCULAR: S1, S2 regular. LUNGS: Clear to auscultation. No wheeze. No rales.  ABDOMEN: Soft, nontender, nondistended. Bowel sounds present.  NEUROLOGIC: Oriented to time, place, person. Has no focal neurological deficit.   PERTINENT LABORATORY DATA: EKG on admission showed normal sinus rhythm 80 beats per minute. INR on admission 2.4. The patient's magnesium was 1.5. Electrolytes: Sodium 136, potassium 3.5, chloride 103, bicarbonate 25, BUN 39, creatinine 3.14, glucose 107. The patient's white count on admission 14, hemoglobin 11.6, hematocrit 35.1, platelets 175,000. Blood cultures showed Escherichia coli sensitive to all antibiotics, namely ampicillin, ceftriaxone, Cipro, gentamicin, Levaquin, and ceftriaxone. Urine cultures were more than 100,000 colonies of Escherichia coli. The patient's urinalysis on admission showed 50 of WBC with 2+ leukocyte esterase and also 1+ bacteria and positive nitrites. She did have hypokalemia, potassium of 3, which was replaced and potassium improved to 3.4. The patient's white count on  22nd showed WBC 15.7, hemoglobin 10.8, hematocrit 33.4, platelets 164,000.   TIME SPENT ON DISCHARGE PREPARATION: More than 30 minutes.    ____________________________ Epifanio Lesches, MD sk:bm D: 04/27/2014 18:09:00 ET T: 04/28/2014 05:23:23 ET JOB#: [id number]  cc: Epifanio Lesches, MD, <Dictator> Marguerita Merles, MD Epifanio Lesches MD ELECTRONICALLY SIGNED 05/09/2014 13:52

## 2014-07-03 NOTE — H&P (Signed)
PATIENT NAME:  Kathryn Kramer, Kathryn Kramer MR#:  [id number] DATE OF BIRTH:  1950/06/28  DATE OF ADMISSION:  04/24/2014  PRIMARY CARE PHYSICIAN: Marguerita Merles, MD  REFERRING PHYSICIAN: Yetta Numbers. Karma Greaser, MD  CHIEF COMPLAINT: Fever and decreased responsiveness 3-4 days.   HISTORY OF PRESENT ILLNESS: A 64 year old female with a history of hypertension, diabetes, and CVA, presented to the ED with the above chief complaint. The patient is alert, awake, oriented, in no acute distress. The patient said that she had a fever and chills for the past 3-4 days. In addition, the patient is not able to control her urine, but she denies any dysuria, hematuria, or cloudy urine. The patient denies any cough, sputum, or shortness of breath. The patient feels thirsty and decreased responsiveness and lethargic, but the patient is alert, awake now.   PAST MEDICAL HISTORY: Hypertension, diabetes, CAD, CHF, atrial fibrillation, GERD, PAD, osteoarthritis, hyperlipidemia, sleep apnea, obesity.   PAST SURGICAL HISTORY: Carpal tunnel surgery, tubal ligation, ovarian cyst surgery, stent on the lower extremities.   SOCIAL HISTORY: Smokes 1/2 pack a day for 40 years. Denies any alcohol drinking or illicit drugs.   FAMILY HISTORY: Father died in 65s of colon cancer. Mother died at 50 and had hypertension, CVA with diabetes. Multiple siblings have CVA, colon polyps,  and diabetes.   ALLERGIES: PRAVACHOL.   HOME MEDICATION:  1.  Zocor 40 mg p.o. daily.  2.  Warfarin 4 mg p.o. 1 tablet on Monday, then take 2 tablets daily on other days.  3.  Seroquel 50 mg p.o. daily.  4.  Quetiapine 50 mg p.o. daily p.r.n.  5.  Potassium chloride 20 mEq 1 tablet once a day.  6.  Omeprazole 20 mg p.o. b.i.d. 7.  Oxybutynin 5 mg p.o. daily.  8.  Metformin 500 mg p.o. b.i.d. 9.  Lasix 20 mg p.o. daily. 10.  Glipizide 5 mg p.o. b.i.d. 11.  Lasix 20 mg p.o. daily.  12.  Clonidine 0.3 mg p.o. b.i.d. 13.  Citalopram 40 mg p.o. daily p.r.n. 14.  Celexa  40 mg p.o. once a day.  15.  Cefpodoxime 100 mg p.o. b.i.d.  16.  Catapres 0.1 mg p.o. b.i.d.  17.  Coreg 6.25 mg p.o. b.i.d. 18.  Calcium 600+ D 600 mg p.o. b.i.d. 19.  Bupropion 150 mg p.o. b.i.d.  20.  Benazepril 20 mg p.o. daily.  21.  Baclofen 10 mg p.o. t.i.d.  22.  Atorvastatin 20 mg p.o. at bedtime.  23.  Aspirin 81 mg p.o. daily.  24.  Norvasc 10 mg p.o. daily.  25.  Albuterol CFC free 90 mcg 1-2 puffs inhaled every 4 hours p.r.n.  26.  Tylenol 325 mg 2 tablets p.o. every 4 hours p.r.n.   REVIEW OF SYSTEMS:  CONSTITUTIONAL: The patient has a fever and chills. No headache or dizziness, but has generalized weakness.  EYES: No double vision or blurred vision.  EAR, NOSE, AND THROAT: No postnasal drip, slurred speech, or dysphagia.  CARDIOVASCULAR: No chest pain, palpitation, orthopnea, or nocturnal dyspnea. No leg edema.  PULMONARY: No cough, sputum, shortness of breath, or hematemesis.  GASTROINTESTINAL: No abdominal pain, nausea, vomiting, diarrhea. No melena or bloody stool.  GENITOURINARY: No dysuria, hematuria, but has incontinence.  NEUROLOGIC: No syncope, loss of consciousness, or seizure.  HEMATOLOGY: No easy bleeding or bleeding.  ENDOCRINE: No polyuria, polydipsia, heat or cold intolerance.    PHYSICAL EXAMINATION:  VITAL SIGNS: Temperature 103.3, blood pressure 127/83, pulse 79, oxygen saturation 94% on room  air.  GENERAL: The patient is alert, awake, oriented, in no acute distress.  HEENT: Pupils round and equal and reactive to light and accommodation. Dry oral mucosa. Clear oropharynx.  NECK: Supple. No JVD or carotid bruit. No lymphadenopathy. No thyromegaly patient's wife,  CARDIOVASCULAR: S1 and S2. Regular rate and rhythm. No murmurs, gallops.  PULMONARY: Bilateral air entry. No wheezing or rales. No use of accessory muscle to breathe.  ABDOMEN: Soft. No distention or tenderness. No organomegaly. Bowel sounds present.  EXTREMITIES: No edema, clubbing, or  cyanosis. No calf tenderness. Bilateral pedal pulses present.  SKIN: No rash or jaundice.  NEUROLOGY: A and O x 3. No focal deficit. Power 5/5. Sensory intact.   LABORATORY AND RADIOGRAPHIC DATA: Chest x-ray no acute disease. Lactic acid 1.4. Urinalysis showed WBC 50, RBC 6, nitrite is positive. CBC shows WBC 14.0, hemoglobin 11.6, platelets 175,000. Glucose 107, BUN 39, creatinine 3.14. Electrolytes normal except magnesium 1.5. Troponin less than 0.02. Alkaline phosphatase 2.1. INR 2.4. EKG shows normal sinus rhythm at 80 BPM.   IMPRESSIONS:  1.  Urinary tract infection.  2.  Sepsis.  3.  Acute renal failure on chronic kidney disease. 4.  Hypertension.  5.  Diabetes.  6.  History of coronary artery disease, cerebrovascular accident, and atrial fibrillation but now normal sinus rhythm.  7.  Hypomagnesemia.  8.  Anemia.   PLAN OF TREATMENT:  1.  The patient will be admitted to medical floor. The patient was treated with antibiotics in the ED. Start Rocephin and follow up urine culture and blood culture.  2.  For acute renal failure on CKD, we will start normal saline IV and follow up BMP.  3.  For hypomagnesemia, we will give magnesium supplement and follow up level.  4.  For diabetes, we will start a sliding scale.  5.  For hypertension, we will continue hypertension medication.  6.  For tobacco abuse, we will give a nicotine patch. Smoking cessation was counseled for 4 minutes.  7.  For history of CVA, we will continue Coumadin dose per pharmacy, follow up INR.   Discussed patient's condition and plan of treatment with the patient.   CODE STATUS: The patient wants full code.   TIME SPENT: About 55 minutes.    ____________________________ Demetrios Loll, MD qc:bm D: 04/24/2014 00:16:03 ET T: 04/24/2014 00:59:55 ET JOB#: [id number]  cc: Demetrios Loll, MD, <Dictator> Demetrios Loll MD ELECTRONICALLY SIGNED 04/24/2014 2:18

## 2014-08-09 ENCOUNTER — Ambulatory Visit (INDEPENDENT_AMBULATORY_CARE_PROVIDER_SITE_OTHER): Payer: Medicare Other | Admitting: Urology

## 2014-08-09 ENCOUNTER — Encounter: Payer: Self-pay | Admitting: *Deleted

## 2014-08-09 ENCOUNTER — Other Ambulatory Visit: Payer: Self-pay | Admitting: *Deleted

## 2014-08-09 VITALS — BP 115/72 | HR 67 | Ht 61.0 in | Wt 228.7 lb

## 2014-08-09 DIAGNOSIS — N8110 Cystocele, unspecified: Secondary | ICD-10-CM

## 2014-08-09 DIAGNOSIS — E668 Other obesity: Secondary | ICD-10-CM | POA: Insufficient documentation

## 2014-08-09 DIAGNOSIS — R32 Unspecified urinary incontinence: Secondary | ICD-10-CM | POA: Diagnosis not present

## 2014-08-09 DIAGNOSIS — IMO0001 Reserved for inherently not codable concepts without codable children: Secondary | ICD-10-CM | POA: Insufficient documentation

## 2014-08-09 DIAGNOSIS — N952 Postmenopausal atrophic vaginitis: Secondary | ICD-10-CM | POA: Diagnosis not present

## 2014-08-09 DIAGNOSIS — IMO0002 Reserved for concepts with insufficient information to code with codable children: Secondary | ICD-10-CM

## 2014-08-09 LAB — BLADDER SCAN AMB NON-IMAGING: Scan Result: 56

## 2014-08-09 MED ORDER — SOLIFENACIN SUCCINATE 10 MG PO TABS: 10.0000 mg | ORAL_TABLET | Freq: Every day | ORAL | Status: DC

## 2014-08-09 MED ORDER — ESTRADIOL 0.1 MG/GM VA CREA
1.0000 | TOPICAL_CREAM | Freq: Every day | VAGINAL | Status: DC
Start: 2014-08-09 — End: 2017-03-20

## 2014-08-09 NOTE — Progress Notes (Signed)
08/09/2014 3:44 PM   Kathryn Kramer [birth year] [id number]  Referring provider: No referring provider defined for this encounter.  Chief Complaint  Patient presents with  . Urinary Tract Infection    6 week followup  . Follow-up    OAB    HPI: Kathryn Kramer is a 64 y/o AA female who was started on Vesicare 5 mg 6 weeks ago for urge incontinence.  When she presented on 07/03/2014, she was on Ditropan, which provided no relief.  Kathryn Kramer discussed behavioral modification, which included avoiding bladder irritants and timed voiding's.  She did not always remember to do these things.  Her PVR today  is 56 cc, which is actually improved from 163 cc 6 weeks ago.  She has not noticed an improvement in her urinary symptoms, since she started the Vesicare.  But, she has not experienced any dry mouth, inability to urinate and/or dry eyes.  She does have constipation, but she has been suffering with that since January.  She states when she has a full bladder and she stands up, the urine just leaks out.  She is also leaking when she laughs, coughs and sneezes, though not as severely.  She denies any fevers, chills, nausea, vomiting, hematuria, dysuria and/or suprapubic pain.    Patient also has a h/o recurrent UTI's by PCP report.  She was hospitalized in the spring for pyelonephritis and she had a +UCx for E. Coli last summer.  She is currently not experiencing dysuria, suprapubic pain, fevers, chills, nausea and/or vomiting.     PMH: Past Medical History  Diagnosis Date  . CHF (congestive heart failure)   . Diabetes mellitus without complication   . Hypertension   . Chronic kidney disease   . Stroke 2001  . Constipation   . Female incontinence   . Acid reflux   . Anxiety   . Arrhythmia   . Recurrent UTI     Surgical History: Past Surgical History  Procedure Laterality Date  . Tubal ligation  1985  . Hand surgery Bilateral [id number]    carpel tunnel  . Colonoscopy  02/16/13  . Hip  surgery Right 1999  . Multiple tooth extractions  2014    Home Medications:    Medication List       This list is accurate as of: 08/09/14  3:44 PM.  Always use your most recent med list.               amLODipine 10 MG tablet  Commonly known as:  NORVASC  Take 1 tablet by mouth daily.     aspirin 81 MG tablet  Take 81 mg by mouth daily.     atorvastatin 20 MG tablet  Commonly known as:  LIPITOR  Take 1 tablet by mouth daily.     baclofen 10 MG tablet  Commonly known as:  LIORESAL  Take 1 tablet by mouth 3 (three) times daily.     benazepril 20 MG tablet  Commonly known as:  LOTENSIN  Take 1 tablet by mouth daily.     buPROPion 150 MG 12 hr tablet  Commonly known as:  WELLBUTRIN SR     CALCIUM + D PO  Take 2 tablets by mouth daily.     carvedilol 12.5 MG tablet  Commonly known as:  COREG  Take 12.5 mg by mouth 2 (two) times daily with a meal.     ciprofloxacin 500 MG tablet  Commonly known as:  CIPRO  citalopram 40 MG tablet  Commonly known as:  CELEXA  Take 1 tablet by mouth daily.     clonazePAM 0.5 MG tablet  Commonly known as:  KLONOPIN     cloNIDine 0.3 MG tablet  Commonly known as:  CATAPRES  Take 0.3 mg by mouth 2 (two) times daily.     estradiol 0.1 MG/GM vaginal cream  Commonly known as:  ESTRACE  Place 1 Applicatorful vaginally at bedtime. Patient was given a sample of vaginal estrogen cream and instructed to apply 0.5mg  (pea-sized amount)  just inside the vaginal introitus with a finger-tip every night for two weeks and then Monday, Wednesday and Friday nights.     furosemide 20 MG tablet  Commonly known as:  LASIX  Take 1 tablet by mouth daily.     glipiZIDE 5 MG tablet  Commonly known as:  GLUCOTROL  Take 1 tablet by mouth daily.     insulin glargine 100 UNIT/ML injection  Commonly known as:  LANTUS  Inject 20 Units into the skin daily.     omeprazole 20 MG capsule  Commonly known as:  PRILOSEC  Take 2 capsules by mouth daily.       oxybutynin 5 MG tablet  Commonly known as:  DITROPAN     polyethylene glycol powder powder  Commonly known as:  GLYCOLAX/MIRALAX  255 grams one bottle for colonoscopy prep     PROAIR HFA 108 (90 BASE) MCG/ACT inhaler  Generic drug:  albuterol  Inhale 2 puffs into the lungs every 8 (eight) hours.     QUEtiapine 50 MG tablet  Commonly known as:  SEROQUEL  Take 1 tablet by mouth daily.     solifenacin 10 MG tablet  Commonly known as:  VESICARE  Take 1 tablet (10 mg total) by mouth daily.     warfarin 4 MG tablet  Commonly known as:  COUMADIN  Take 2 tablets by mouth daily.        Allergies:  Allergies  Allergen Reactions  . Pravastatin Itching    Per patient "itching on the bottom of feet"    Family History: Family History  Problem Relation Age of Onset  . Colon cancer Father   . Colon polyps Brother   . Colon polyps Daughter     Social History:  reports that she has been smoking Cigarettes.  She has a 10 pack-year smoking history. She has never used smokeless tobacco. She reports that she does not drink alcohol or use illicit drugs.  ROS: Urological Symptom Review  Patient is experiencing the following symptoms: Frequent urination Leakage of urine   Review of Systems  Gastrointestinal (upper)  : Indigestion/heartburn  Gastrointestinal (lower) : Constipation  Constitutional : Night Sweats  Skin: Negative for skin symptoms  Eyes: Negative for eye symptoms  Ear/Nose/Throat : Negative for Ear/Nose/Throat symptoms  Hematologic/Lymphatic: Negative for Hematologic/Lymphatic symptoms  Cardiovascular : Negative for cardiovascular symptoms  Respiratory : Negative for respiratory symptoms  Endocrine: Negative for endocrine symptoms  Musculoskeletal: Negative for musculoskeletal symptoms  Neurological: Negative for neurological symptoms  Psychologic: Negative for psychiatric symptoms   Physical Exam: BP 115/72 mmHg  Pulse 67  Ht  5\' 1"  (1.549 m)  Wt 228 lb 11.2 oz (103.738 kg)  BMI 43.23 kg/m2   GU: Atrophic external genitalia.  Normal urethral meatus. No urethral masses and/or tenderness. No discharge (urethral hypermobility without demonstrable SUI), No bladder fullness or masses. Grade II cystocele. Atrophic vaginal lesions, but no discharge. Normal rectal tone, no masses.  Normal anus and perineum.   Laboratory Data: Lab Results  Component Value Date   WBC 12.9* 10/02/2013   HGB 12.0 10/02/2013   HCT 37.2 10/02/2013   MCV 87 10/02/2013   PLT 267 10/02/2013    Lab Results  Component Value Date   CREATININE 2.83* 10/02/2013    No results found for: PSA  No results found for: TESTOSTERONE  No results found for: HGBA1C  Urinalysis No results found for: COLORURINE, APPEARANCEUR, [place], [place], GLUCOSEU, [place], [place], Derrill Memo, [place], NITRITE, LEUKOCYTESUR  Pertinent Imaging: Results for MARSI, TURVEY (MRN [id number]) as of 08/09/2014 11:24  Ref. Range 08/09/2014 11:13  Scan Result Unknown 56      Assessment & Plan:    1. Incontinence-  Patient did not find any improvement in her incontinence with  the Vesicare 5 mg daily in her urge incontinence.   She did not experience any adverse side effects with the medication.  She would like to try the higher dose of Vesicare 10 mg daily.  I explained to the patient that it most likely would not help with the SUI, but it may help with the UI.  She would need to have a pessary fitting and/or PT for SUI. She is given samples of the 10 mg Vesicare and a prescription is e scribed to her pharmacy. She will RTC in 6 weeks for a PVR and symptom recheck.   2. Cystocele- Patient with a Grade II cystocele and symptoms of SUI.  She is not interested in PT at this time.  She would like a referral to gynecology for a pessary fitting.  I have sent a referral request for [facility] for pessary fitting.   3. Atrophic vaginitis- Patient was given a sample  of vaginal estrogen cream (Estrace) and instructed to apply 0.5mg  (pea-sized amount)  just inside the vaginal introitus with a finger-tip every night for two weeks and then Monday, Wednesday and Friday nights.  I explained to the patient that vaginally administered estrogen, which causes only a slight increase in the blood estrogen levels, have fewer contraindications and adverse systemic effects that oral HT.  I have also e scribed a prescription to her pharmacy.  I will reexamine her vaginal mucosa when she RTC in 6 weeks.    - Bladder Scan (Post Void Residual) in office  4. Recurrent UTI- Patient was found to have atrophic vaginitis on today's exam.  This is a risk factor for UTI's.  I have started her vaginal estrogen cream today and will reexamine her when she RTC in 6 weeks.    5. Obesity- Patient advised on dietary issues.    Return in about 6 weeks (around 09/20/2014) for PVR, exam.  Zara Council, [facility]  [facility] [address], [place], [place] [address] [phone]

## 2014-09-20 ENCOUNTER — Ambulatory Visit: Payer: Medicare Other | Admitting: Urology

## 2014-10-04 ENCOUNTER — Ambulatory Visit: Payer: Medicare Other | Admitting: Urology

## 2014-10-20 ENCOUNTER — Encounter: Payer: Self-pay | Admitting: Urology

## 2014-10-20 ENCOUNTER — Ambulatory Visit (INDEPENDENT_AMBULATORY_CARE_PROVIDER_SITE_OTHER): Payer: Medicare Other | Admitting: Urology

## 2014-10-20 VITALS — BP 134/81 | HR 64 | Ht 61.0 in | Wt 228.5 lb

## 2014-10-20 DIAGNOSIS — N952 Postmenopausal atrophic vaginitis: Secondary | ICD-10-CM | POA: Diagnosis not present

## 2014-10-20 DIAGNOSIS — R32 Unspecified urinary incontinence: Secondary | ICD-10-CM | POA: Diagnosis not present

## 2014-10-20 DIAGNOSIS — N390 Urinary tract infection, site not specified: Secondary | ICD-10-CM | POA: Diagnosis not present

## 2014-10-20 DIAGNOSIS — IMO0001 Reserved for inherently not codable concepts without codable children: Secondary | ICD-10-CM

## 2014-10-20 DIAGNOSIS — N8110 Cystocele, unspecified: Secondary | ICD-10-CM | POA: Diagnosis not present

## 2014-10-20 DIAGNOSIS — IMO0002 Reserved for concepts with insufficient information to code with codable children: Secondary | ICD-10-CM

## 2014-10-20 LAB — BLADDER SCAN AMB NON-IMAGING: Scan Result: 97

## 2014-10-29 DIAGNOSIS — N390 Urinary tract infection, site not specified: Secondary | ICD-10-CM | POA: Insufficient documentation

## 2014-10-29 NOTE — Progress Notes (Signed)
10/20/2014 6:34 PM   Kathryn Kramer May 09, 1950 [id number]  Referring provider: No referring provider defined for this encounter.  Chief Complaint  Patient presents with  . Urinary Incontinence    pt states her urinary symtpoms have improved since going on the higher dosage of Vesicare. The pt was daignose w/ UTI on 10/10/14, she currently taking Ciprofloxican 500MG  x 2 weeks    HPI: Patient is a 64 year old African-American female who presents today to discuss the effects of the increase of her Vesicare from 5 mg to 10 mg on her urge incontinence, reexamine her vaginal mucosa after being prescribed vaginal estrogen cream, referral for a pessary fitting and a history of recurrent UTI's.  Incontinence Patient has components of both stress and urge incontinence.  She was initially started on Vesicare 5 mg daily in efforts to control her urge incontinence. She did not find improvement with her urge incontinence at the 5 mg dose, but she did not experience any untoward side effects with this dosage. At her last visit we discussed increasing the Vesicare to 10 mg daily, she is finding the increase to the 10 mg dosage more effective. She is having some mild dry mouth and constipation.  She does experience leakage when laughing and coughing and would like to be referred to gynecology for a pessary fitting.  Atrophic vaginitis Patient discontinued the Estrace cream after 1 week. She stated she did not find any relief from using the cream.  Recurrent UTI's Patient has a history of recurrent urinary tract infections as diagnosed of her primary care physician's office. I do not have the urine culture results or the sensitivities of previous infections. Her last infection was on 10/03/2013. Is positive for Escherichia coli and sensitive to the Cipro that she is currently taking.  She states her symptoms of infection or burning with urination.  PMH: Past Medical History  Diagnosis Date  . CHF  (congestive heart failure)   . Diabetes mellitus without complication   . Hypertension   . Chronic kidney disease   . Stroke 2001  . Constipation   . Female incontinence   . Acid reflux   . Anxiety   . Arrhythmia   . Recurrent UTI     Surgical History: Past Surgical History  Procedure Laterality Date  . Tubal ligation  1985  . Hand surgery Bilateral [id number]    carpel tunnel  . Colonoscopy  02/16/13  . Hip surgery Right 1999  . Multiple tooth extractions  2014    Home Medications:    Medication List       This list is accurate as of: 10/20/14 11:59 PM.  Always use your most recent med list.               amLODipine 10 MG tablet  Commonly known as:  NORVASC  Take 1 tablet by mouth daily.     aspirin 81 MG tablet  Take 81 mg by mouth daily.     atorvastatin 20 MG tablet  Commonly known as:  LIPITOR  Take 1 tablet by mouth daily.     baclofen 10 MG tablet  Commonly known as:  LIORESAL  Take 1 tablet by mouth 3 (three) times daily.     benazepril 20 MG tablet  Commonly known as:  LOTENSIN  Take 1 tablet by mouth daily.     buPROPion 150 MG 12 hr tablet  Commonly known as:  WELLBUTRIN SR     CALCIUM + D PO  Take 2 tablets by mouth daily.     carvedilol 12.5 MG tablet  Commonly known as:  COREG  Take 12.5 mg by mouth 2 (two) times daily with a meal.     ciprofloxacin 500 MG tablet  Commonly known as:  CIPRO     citalopram 40 MG tablet  Commonly known as:  CELEXA  Take 1 tablet by mouth daily.     clonazePAM 0.5 MG tablet  Commonly known as:  KLONOPIN     cloNIDine 0.3 MG tablet  Commonly known as:  CATAPRES  Take 0.3 mg by mouth 2 (two) times daily.     estradiol 0.1 MG/GM vaginal cream  Commonly known as:  ESTRACE  Place 1 Applicatorful vaginally at bedtime. Patient was given a sample of vaginal estrogen cream and instructed to apply 0.5mg  (pea-sized amount)  just inside the vaginal introitus with a finger-tip every night for two weeks and  then Monday, Wednesday and Friday nights.     furosemide 20 MG tablet  Commonly known as:  LASIX  Take 1 tablet by mouth daily.     glipiZIDE 5 MG tablet  Commonly known as:  GLUCOTROL  Take 1 tablet by mouth daily.     insulin glargine 100 UNIT/ML injection  Commonly known as:  LANTUS  Inject 20 Units into the skin daily.     MONOJECT INSULIN SYRINGE 31G X 5/16" 0.5 ML Misc  Generic drug:  Insulin Syringe-Needle U-100     omeprazole 20 MG capsule  Commonly known as:  PRILOSEC  Take 2 capsules by mouth daily.     oxybutynin 5 MG tablet  Commonly known as:  DITROPAN     polyethylene glycol powder powder  Commonly known as:  GLYCOLAX/MIRALAX  255 grams one bottle for colonoscopy prep     PROAIR HFA 108 (90 BASE) MCG/ACT inhaler  Generic drug:  albuterol  Inhale 2 puffs into the lungs every 8 (eight) hours.     QUEtiapine 50 MG tablet  Commonly known as:  SEROQUEL  Take 1 tablet by mouth daily.     solifenacin 10 MG tablet  Commonly known as:  VESICARE  Take 1 tablet (10 mg total) by mouth daily.     warfarin 4 MG tablet  Commonly known as:  COUMADIN  Take 2 tablets by mouth daily.        Allergies:  Allergies  Allergen Reactions  . Pravastatin Itching    Per patient "itching on the bottom of feet"    Family History: Family History  Problem Relation Age of Onset  . Colon cancer Father   . Colon polyps Brother   . Colon polyps Daughter     Social History:  reports that she has been smoking Cigarettes.  She has a 10 pack-year smoking history. She has never used smokeless tobacco. She reports that she does not drink alcohol or use illicit drugs.  ROS: UROLOGY Frequent Urination?: No Hard to postpone urination?: No Burning/pain with urination?: Yes Get up at night to urinate?: No Leakage of urine?: No Urine stream starts and stops?: No Trouble starting stream?: No Do you have to strain to urinate?: No Blood in urine?: No Urinary tract infection?:  Yes Sexually transmitted disease?: No Injury to kidneys or bladder?: No Painful intercourse?: No Weak stream?: No Currently pregnant?: No Vaginal bleeding?: No Last menstrual period?: n  Gastrointestinal Nausea?: No Vomiting?: No Indigestion/heartburn?: No Diarrhea?: No Constipation?: No  Constitutional Fever: No Night sweats?: No Weight loss?: No  Fatigue?: No  Skin Skin rash/lesions?: No Itching?: No  Eyes Blurred vision?: No Double vision?: No  Ears/Nose/Throat Sore throat?: No Sinus problems?: No  Hematologic/Lymphatic Swollen glands?: No Easy bruising?: No  Cardiovascular Leg swelling?: No Chest pain?: No  Respiratory Cough?: No Shortness of breath?: No  Endocrine Excessive thirst?: No  Musculoskeletal Back pain?: No Joint pain?: No  Neurological Headaches?: No Dizziness?: No  Psychologic Depression?: No Anxiety?: No  Physical Exam: BP 134/81 mmHg  Pulse 64  Ht 5\' 1"  (1.549 m)  Wt 228 lb 8 oz (103.647 kg)  BMI 43.20 kg/m2   Laboratory Data: Lab Results  Component Value Date   WBC 12.9* 10/02/2013   HGB 12.0 10/02/2013   HCT 37.2 10/02/2013   MCV 87 10/02/2013   PLT 267 10/02/2013    Lab Results  Component Value Date   CREATININE 2.83* 10/02/2013    Pertinent Imaging: Results for orders placed or performed in visit on 10/20/14  Bladder Scan (Post Void Residual) in office  Result Value Ref Range   Scan Result 97     Assessment & Plan:    1. Incontinence:   Patient has found relief from her urge incontinence with the Vesicare 10 mg daily.  She will continue that medication.    - Bladder Scan (Post Void Residual) in office  2. Cystocele, grade 2:   Patient is still experiencing stress incontinence. She would like a referral to gynecology for evaluation for a pessary fitting.  We will arrange a referral to [facility] for a pessary fitting.  - Bladder Scan (Post Void Residual) in office  3. Atrophic vaginitis:   I  explained to the patient that the use of the vaginal cream is to the vaginal mucosa in an effort to prevent recurrent urinary tract infections.  I have given her another sample of Estrace cream.  She is instructed to apply 0.5mg  (pea-sized amount)  just inside the vaginal introitus with a finger-tip every night for two weeks and then Monday, Wednesday and Friday nights.  She will return in 2 weeks for reexamination of her vaginal mucosa.  - Ambulatory referral to Gynecology  4. Recurrent UTI's:   Patient asked if she may be placed on suppressive antibiotics. I explained to her that the use of the suppressive antibiotic would increase the resistance patterns of bacteria in the urinary tract. It may also disrupt her natural bowel and vaginal flora.  I have encouraged her to use the vaginal estrogen cream in an effort to decrease her infections.   Return in about 2 weeks (around 11/03/2014) for exam, UA and PVR.  Zara Council, [facility] [address] [place], [place] [address] [phone]

## 2014-11-09 ENCOUNTER — Ambulatory Visit: Payer: Medicare Other | Admitting: Urology

## 2014-11-15 ENCOUNTER — Encounter: Payer: Self-pay | Admitting: Urology

## 2014-11-15 ENCOUNTER — Ambulatory Visit (INDEPENDENT_AMBULATORY_CARE_PROVIDER_SITE_OTHER): Payer: Medicare Other | Admitting: Urology

## 2014-11-15 VITALS — BP 150/84 | HR 64 | Ht 61.0 in | Wt 228.8 lb

## 2014-11-15 DIAGNOSIS — N8110 Cystocele, unspecified: Secondary | ICD-10-CM

## 2014-11-15 DIAGNOSIS — N952 Postmenopausal atrophic vaginitis: Secondary | ICD-10-CM

## 2014-11-15 DIAGNOSIS — IMO0001 Reserved for inherently not codable concepts without codable children: Secondary | ICD-10-CM

## 2014-11-15 DIAGNOSIS — R32 Unspecified urinary incontinence: Secondary | ICD-10-CM

## 2014-11-15 DIAGNOSIS — N390 Urinary tract infection, site not specified: Secondary | ICD-10-CM

## 2014-11-15 DIAGNOSIS — IMO0002 Reserved for concepts with insufficient information to code with codable children: Secondary | ICD-10-CM

## 2014-11-15 LAB — URINALYSIS, COMPLETE
Bilirubin, UA: NEGATIVE
Glucose, UA: NEGATIVE
Ketones, UA: NEGATIVE
Nitrite, UA: POSITIVE — AB
PH UA: 7.5 (ref 5.0–7.5)
PROTEIN UA: NEGATIVE
Specific Gravity, UA: 1.015 (ref 1.005–1.030)
Urobilinogen, Ur: 0.2 mg/dL (ref 0.2–1.0)

## 2014-11-15 LAB — MICROSCOPIC EXAMINATION
RBC, UA: NONE SEEN /hpf (ref 0–?)
WBC, UA: >30 /hpf — ABNORMAL HIGH (ref 0–?)

## 2014-11-15 LAB — BLADDER SCAN AMB NON-IMAGING: Scan Result: 12

## 2014-11-15 MED ORDER — NITROFURANTOIN MONOHYD MACRO 100 MG PO CAPS: 100.0000 mg | ORAL_CAPSULE | Freq: Two times a day (BID) | ORAL | Status: DC

## 2014-11-15 NOTE — Progress Notes (Signed)
11/15/2014 9:41 PM   Kathryn Kramer 1950/11/22 [id number]  Referring provider: No referring provider defined for this encounter.  Chief Complaint  Patient presents with  . Urinary Incontinence    two weeks follow up  . Vaginitis    HPI: Patient is a 64 year old African American female who presents today for examination of her vaginal mucosa after being prescribed vaginal estrogen cream.    She states she is been using the cream as prescribed. She is applying a pea-sized amount with her fingertip to external vaginal area every night. She states the cream has not given her a rash or irritation.  She is experiencing symptoms of urinary tract infection.  She is having dysuria, but she denies any gross hematuria or suprapubic pain. She also denies any fevers, chills, nausea or vomiting.  Her UA today is suspicious for infection.  Per patient report, she does suffer with recurrent urinary tract infections. We have requested this laboratory data, though still have not yet received those records.  She does have incontinence which has improvement with the Vesicare 10 mg daily.  She is continuing with that medication.  She also has a cystocele and has an upcoming appointment with [facility] for pessary fitting.  PMH: Past Medical History  Diagnosis Date  . CHF (congestive heart failure)   . Diabetes mellitus without complication   . Hypertension   . Chronic kidney disease   . Stroke 2001  . Constipation   . Female incontinence   . Acid reflux   . Anxiety   . Arrhythmia   . Recurrent UTI     Surgical History: Past Surgical History  Procedure Laterality Date  . Tubal ligation  1985  . Hand surgery Bilateral [id number]    carpel tunnel  . Colonoscopy  02/16/13  . Hip surgery Right 1999  . Multiple tooth extractions  2014    Home Medications:    Medication List       This list is accurate as of: 11/15/14 11:59 PM.  Always use your most recent med list.               amLODipine 10 MG tablet  Commonly known as:  NORVASC  Take 1 tablet by mouth daily.     aspirin 81 MG tablet  Take 81 mg by mouth daily.     atorvastatin 20 MG tablet  Commonly known as:  LIPITOR  Take 1 tablet by mouth daily.     baclofen 10 MG tablet  Commonly known as:  LIORESAL  Take 1 tablet by mouth 3 (three) times daily.     benazepril 20 MG tablet  Commonly known as:  LOTENSIN  Take 1 tablet by mouth daily.     buPROPion 150 MG 12 hr tablet  Commonly known as:  WELLBUTRIN SR     CALCIUM + D PO  Take 2 tablets by mouth daily.     carvedilol 12.5 MG tablet  Commonly known as:  COREG  Take 12.5 mg by mouth 2 (two) times daily with a meal.     ciprofloxacin 500 MG tablet  Commonly known as:  CIPRO     citalopram 40 MG tablet  Commonly known as:  CELEXA  Take 1 tablet by mouth daily.     clonazePAM 0.5 MG tablet  Commonly known as:  KLONOPIN     cloNIDine 0.3 MG tablet  Commonly known as:  CATAPRES  Take 0.3 mg by mouth 2 (two) times daily.  estradiol 0.1 MG/GM vaginal cream  Commonly known as:  ESTRACE  Place 1 Applicatorful vaginally at bedtime. Patient was given a sample of vaginal estrogen cream and instructed to apply 0.5mg  (pea-sized amount)  just inside the vaginal introitus with a finger-tip every night for two weeks and then Monday, Wednesday and Friday nights.     furosemide 20 MG tablet  Commonly known as:  LASIX  Take 1 tablet by mouth 2 (two) times daily.     glipiZIDE 5 MG tablet  Commonly known as:  GLUCOTROL  Take 1 tablet by mouth daily.     insulin glargine 100 UNIT/ML injection  Commonly known as:  LANTUS  Inject 20 Units into the skin daily.     MONOJECT INSULIN SYRINGE 31G X 5/16" 0.5 ML Misc  Generic drug:  Insulin Syringe-Needle U-100     nitrofurantoin (macrocrystal-monohydrate) 100 MG capsule  Commonly known as:  MACROBID  Take 1 capsule (100 mg total) by mouth every 12 (twelve) hours.     omeprazole 20 MG capsule    Commonly known as:  PRILOSEC  Take 2 capsules by mouth daily.     oxybutynin 5 MG tablet  Commonly known as:  DITROPAN     polyethylene glycol powder powder  Commonly known as:  GLYCOLAX/MIRALAX  255 grams one bottle for colonoscopy prep     PROAIR HFA 108 (90 BASE) MCG/ACT inhaler  Generic drug:  albuterol  Inhale 2 puffs into the lungs every 8 (eight) hours.     QUEtiapine 50 MG tablet  Commonly known as:  SEROQUEL  Take 1 tablet by mouth daily.     solifenacin 10 MG tablet  Commonly known as:  VESICARE  Take 1 tablet (10 mg total) by mouth daily.     COUMADIN 1 MG tablet  Generic drug:  warfarin  Take 1 mg by mouth daily. Take 4 per day Mon-Friday and 3 per day on Saturday and Sunday.     warfarin 4 MG tablet  Commonly known as:  COUMADIN  Take 1 tablet by mouth daily.        Allergies:  Allergies  Allergen Reactions  . Pravastatin Itching    Per patient "itching on the bottom of feet"    Family History: Family History  Problem Relation Age of Onset  . Colon cancer Father   . Colon polyps Brother   . Colon polyps Daughter   . Kidney disease Neg Hx   . Bladder Cancer Neg Hx     Social History:  reports that she has been smoking Cigarettes.  She has a 10 pack-year smoking history. She has never used smokeless tobacco. She reports that she does not drink alcohol or use illicit drugs.  ROS: UROLOGY Frequent Urination?: No Hard to postpone urination?: No Burning/pain with urination?: No Get up at night to urinate?: Yes Leakage of urine?: Yes Urine stream starts and stops?: No Trouble starting stream?: No Do you have to strain to urinate?: No Blood in urine?: No Urinary tract infection?: No Sexually transmitted disease?: No Injury to kidneys or bladder?: No Painful intercourse?: No Weak stream?: No Currently pregnant?: No Vaginal bleeding?: No Last menstrual period?: n  Gastrointestinal Nausea?: No Vomiting?: No Indigestion/heartburn?:  No Diarrhea?: No Constipation?: No  Constitutional Fever: No Night sweats?: No Weight loss?: No Fatigue?: No  Skin Skin rash/lesions?: No Itching?: No  Eyes Blurred vision?: No Double vision?: No  Ears/Nose/Throat Sore throat?: No Sinus problems?: No  Hematologic/Lymphatic Swollen glands?: No Easy  bruising?: No  Cardiovascular Leg swelling?: No Chest pain?: No  Respiratory Cough?: No Shortness of breath?: No  Endocrine Excessive thirst?: No  Musculoskeletal Back pain?: No Joint pain?: No  Neurological Headaches?: No Dizziness?: No  Psychologic Depression?: No Anxiety?: No  Physical Exam: BP 150/84 mmHg  Pulse 64  Ht 5\' 1"  (1.549 m)  Wt 228 lb 12.8 oz (103.783 kg)  BMI 43.25 kg/m2  GU: Atrophic external genitalia. Normal urethral meatus. No urethral masses and/or tenderness. No discharge (urethral hypermobility without demonstrable SUI), No bladder fullness or masses. Grade II cystocele. Atrophic vaginal lesions, but no discharge. Normal rectal tone, no masses. Normal anus and perineum.   Laboratory Data: Lab Results  Component Value Date   WBC 12.9* 10/02/2013   HGB 12.0 10/02/2013   HCT 37.2 10/02/2013   MCV 87 10/02/2013   PLT 267 10/02/2013   Lab Results  Component Value Date   CREATININE 2.83* 10/02/2013   Urinalysis: Results for orders placed or performed in visit on 11/15/14  Microscopic Examination  Result Value Ref Range   WBC, UA >30 (H) 0 -  5 /hpf   RBC, UA None seen 0 -  2 /hpf   Epithelial Cells (non renal) 0-10 0 - 10 /hpf   Bacteria, UA Many (A) None seen/Few  Urinalysis, Complete  Result Value Ref Range   Specific Gravity, UA 1.015 1.005 - 1.030   pH, UA 7.5 5.0 - 7.5   Color, UA Yellow Yellow   Appearance Ur Turbid (A) Clear   Leukocytes, UA 3+ (A) Negative   Protein, UA Negative Negative/Trace   Glucose, UA Negative Negative   Ketones, UA Negative Negative   RBC, UA 1+ (A) Negative   Bilirubin, UA Negative  Negative   Urobilinogen, Ur 0.2 0.2 - 1.0 mg/dL   Nitrite, UA Positive (A) Negative   Microscopic Examination See below:     Pertinent Imaging: Results for PUNEET, SELDEN (MRN [id number]) as of 11/17/2014 21:28  Ref. Range 11/15/2014 16:07  Scan Result Unknown 12    Assessment & Plan:    1. Incontinence: Patient has found relief from her urge incontinence with the Vesicare 10 mg daily. She will continue that medication.   - Bladder Scan (Post Void Residual) in office  2. Cystocele, grade 2: Patient is still experiencing stress incontinence. She would like a referral to gynecology for evaluation for a pessary fitting. She has upcoming appointment to [facility] for a pessary fitting.  3. Atrophic vaginitis: Patient is using the vaginal estrogen cream as directed. She is not convinced that this will help prevent urinary tract infections. I have encouraged her to continue to apply 0.5mg  (pea-sized amount) just inside the vaginal introitus with a finger-tip every on Monday, Wednesday and Friday nights.   4. Recurrent UTI's: At her last visit, patient asked if she may be placed on suppressive antibiotics. I explained to her that the use of the suppressive antibiotic would increase the resistance patterns of bacteria in the urinary tract. It may also disrupt her natural bowel and vaginal flora. I have encouraged her to use the vaginal estrogen cream in an effort to decrease her infections.  I will also schedule her for cystoscopy for further investigation and into why she continues to have recurrent urinary tract infections before prescribing suppressive antibiotics.  Her UA is suspicious for infection and I will send it for culture.  I prescribed nitrofurantoin  empirically until sensitivities are available.   Return for cystoscopy.  Nandi Tonnesen,  PA-C  [facility] [address] [place], [place] [address] [phone]

## 2014-11-16 ENCOUNTER — Other Ambulatory Visit: Payer: Self-pay

## 2014-11-16 DIAGNOSIS — N390 Urinary tract infection, site not specified: Secondary | ICD-10-CM

## 2014-11-16 MED ORDER — NITROFURANTOIN MONOHYD MACRO 100 MG PO CAPS: 100.0000 mg | ORAL_CAPSULE | Freq: Two times a day (BID) | ORAL | Status: DC

## 2014-12-07 ENCOUNTER — Encounter: Payer: Self-pay | Admitting: Obstetrics and Gynecology

## 2014-12-07 ENCOUNTER — Ambulatory Visit (INDEPENDENT_AMBULATORY_CARE_PROVIDER_SITE_OTHER): Payer: Medicare Other | Admitting: Obstetrics and Gynecology

## 2014-12-07 VITALS — BP 134/75 | HR 64 | Ht 61.0 in | Wt 235.1 lb

## 2014-12-07 DIAGNOSIS — N816 Rectocele: Secondary | ICD-10-CM

## 2014-12-07 DIAGNOSIS — IMO0002 Reserved for concepts with insufficient information to code with codable children: Secondary | ICD-10-CM

## 2014-12-07 DIAGNOSIS — Z720 Tobacco use: Secondary | ICD-10-CM | POA: Diagnosis not present

## 2014-12-07 DIAGNOSIS — N8110 Cystocele, unspecified: Secondary | ICD-10-CM | POA: Diagnosis not present

## 2014-12-07 DIAGNOSIS — R351 Nocturia: Secondary | ICD-10-CM

## 2014-12-07 DIAGNOSIS — R339 Retention of urine, unspecified: Secondary | ICD-10-CM | POA: Diagnosis not present

## 2014-12-07 DIAGNOSIS — N952 Postmenopausal atrophic vaginitis: Secondary | ICD-10-CM | POA: Diagnosis not present

## 2014-12-07 DIAGNOSIS — N39490 Overflow incontinence: Secondary | ICD-10-CM

## 2014-12-07 DIAGNOSIS — N393 Stress incontinence (female) (male): Secondary | ICD-10-CM

## 2014-12-07 DIAGNOSIS — N951 Menopausal and female climacteric states: Secondary | ICD-10-CM | POA: Diagnosis not present

## 2014-12-07 NOTE — Patient Instructions (Signed)
1.  Pessary is fitted today. 2.  Patient is to return in 1 week for pessary insertion. 3.  Smoking cessation. 4.  Weight loss Encouragement

## 2014-12-07 NOTE — Progress Notes (Signed)
  PATIENT EVALUATION  Referring physician: [facility] Chief complaint: Cystocele.  The patient is a 64 year old menopausal African-American female, para 5, 50, on no hormone replacement therapy, who presents in referral from [facility] urology for evaluation of cystocele management. Patient has mild pelvic pressure, but does not experience any bulge from the introitus. The patient does have stress urinary incontinence as well as spontaneous leaking without associated Valsalva maneuver. She does have incomplete adder emptying. She does have nocturia 5 or 6 times per night. She does have history of recurrent UTIs with approximately 4 episodes in the past year. No history of pyelonephritis..  GI history notable for chronic constipation requiring cathartics.  Past Medical History  Diagnosis Date  . CHF (congestive heart failure) ([place])   . Diabetes mellitus without complication ([place])   . Hypertension   . Chronic kidney disease   . Stroke ([place]) 2001  . Constipation   . Female incontinence   . Acid reflux   . Anxiety   . Arrhythmia   . Recurrent UTI   . Cystocele, grade 2    Past Surgical History  Procedure Laterality Date  . Tubal ligation  1985  . Hand surgery Bilateral [id number]    carpel tunnel  . Colonoscopy  02/16/13  . Hip surgery Right 1999  . Multiple tooth extractions  2014  . Elbow      pinch nerve    PAST OB HISTORY: Para 5005 Largest infant weighed 6 pounds.  Family history and social history are reviewed.  OBJECTIVE: BP 134/75 mmHg  Pulse 64  Ht 5\' 1"  (1.549 m)  Wt 235 lb 1.6 oz (106.641 kg)  BMI 44.44 kg/m2 Pleasant elderly female in no acute distress.  She is a suboptimal historian. Back: No CVA teness. Abdomen: No organomegaly,; soft, nontender. Pelvic exam: External genitalia-normal BUS-normal. Vagina-atrophic changes, moderate;First to second-degree cystocele; mild rectocele Cervix-no lesions. Uterus-midplane, normal size and shape;No  significant uterine descensus. Adnexa-nonpalpable, nontender Rectovaginal-normal External exam,; normal sphincter tone; Rectocele confirmed..  Procedures: Postvoid residual-150 mL; Pessary fitting  ASSESSMENT: 1.  Symptomatic cystocele, first to second degree with urinary incontinence including SUI, spontaneous  Urinary leakage, as well as incomplete emptying. 2.  Menopausal state. 3.  Vaginal atrophy 4.  Tobacco user 5.  Diabetes mellitus  PLAN: 1.  Pessary fitting completed.-Incontinence dish with notch tube and three-quarter inch. 2.   Patient will be contacted when pessary arrives for insertion.  Brayton Mars, MD

## 2014-12-08 ENCOUNTER — Other Ambulatory Visit: Payer: Medicare Other | Admitting: Urology

## 2014-12-09 ENCOUNTER — Other Ambulatory Visit: Payer: Medicare Other | Admitting: Urology

## 2014-12-20 ENCOUNTER — Telehealth: Payer: Self-pay

## 2014-12-22 NOTE — Telephone Encounter (Signed)
Left message to contact office on 12/20/14. Spoke with pt and daughter and scheduled appt for 01/03/15 at 4:00p to have pessary inserted.

## 2014-12-23 ENCOUNTER — Other Ambulatory Visit: Payer: Self-pay | Admitting: Family Medicine

## 2014-12-23 DIAGNOSIS — R928 Other abnormal and inconclusive findings on diagnostic imaging of breast: Secondary | ICD-10-CM

## 2014-12-29 ENCOUNTER — Other Ambulatory Visit: Payer: Medicare Other | Admitting: Urology

## 2015-01-03 ENCOUNTER — Ambulatory Visit (INDEPENDENT_AMBULATORY_CARE_PROVIDER_SITE_OTHER): Payer: Medicare Other | Admitting: Obstetrics and Gynecology

## 2015-01-03 ENCOUNTER — Encounter: Payer: Self-pay | Admitting: Obstetrics and Gynecology

## 2015-01-03 VITALS — BP 149/80 | HR 73 | Ht 61.0 in | Wt 231.3 lb

## 2015-01-03 DIAGNOSIS — N816 Rectocele: Secondary | ICD-10-CM

## 2015-01-03 DIAGNOSIS — N393 Stress incontinence (female) (male): Secondary | ICD-10-CM

## 2015-01-03 DIAGNOSIS — N8110 Cystocele, unspecified: Secondary | ICD-10-CM

## 2015-01-03 DIAGNOSIS — IMO0002 Reserved for concepts with insufficient information to code with codable children: Secondary | ICD-10-CM

## 2015-01-03 NOTE — Progress Notes (Signed)
Patient ID: Kathryn Kramer, female   DOB: [birth year], 64 y.o.   MRN: [id number] Pessary insertion- ring w/support with knotch- 34mm- cystocele  Chief complaint 1.  Stress incontinence. 2.   Cystocele. 3.  Rectocele  Patient presents today for pessary insertion.  The wrong pessary was obtained.  Reorder will be done.  Patient will be contacted when new pessary arrives.  No charge visit.Brayton Mars, MD

## 2015-01-03 NOTE — Patient Instructions (Signed)
1.  We will call you when the pessary comes in.. 2.  No charge visit today.

## 2015-01-19 ENCOUNTER — Ambulatory Visit: Payer: Medicare Other | Admitting: Urology

## 2015-01-19 ENCOUNTER — Encounter: Payer: Self-pay | Admitting: Urology

## 2015-01-19 VITALS — BP 173/95 | HR 58 | Ht 61.0 in | Wt 230.0 lb

## 2015-01-19 DIAGNOSIS — N390 Urinary tract infection, site not specified: Secondary | ICD-10-CM

## 2015-01-19 LAB — URINALYSIS, COMPLETE
BILIRUBIN UA: NEGATIVE
GLUCOSE, UA: NEGATIVE
KETONES UA: NEGATIVE
Nitrite, UA: POSITIVE — AB
PROTEIN UA: NEGATIVE
SPEC GRAV UA: 1.01 (ref 1.005–1.030)
UUROB: 0.2 mg/dL (ref 0.2–1.0)
pH, UA: 6.5 (ref 5.0–7.5)

## 2015-01-19 LAB — MICROSCOPIC EXAMINATION: RBC, UA: NONE SEEN /hpf (ref 0–?)

## 2015-01-19 MED ORDER — LIDOCAINE HCL 2 % EX GEL
1.0000 "application " | Freq: Once | CUTANEOUS | Status: AC
  Administered 2015-01-19: 1 via URETHRAL

## 2015-01-19 MED ORDER — CIPROFLOXACIN HCL 500 MG PO TABS
500.0000 mg | ORAL_TABLET | Freq: Once | ORAL | Status: AC
  Administered 2015-01-19: 500 mg via ORAL

## 2015-01-19 NOTE — Progress Notes (Signed)
Reschedule cysto 2/2 positive U/A. Will send culture and treat accordingly

## 2015-01-21 LAB — CULTURE, URINE COMPREHENSIVE

## 2015-01-25 ENCOUNTER — Ambulatory Visit (INDEPENDENT_AMBULATORY_CARE_PROVIDER_SITE_OTHER): Payer: Medicare Other | Admitting: Obstetrics and Gynecology

## 2015-01-25 ENCOUNTER — Encounter: Payer: Self-pay | Admitting: Obstetrics and Gynecology

## 2015-01-25 VITALS — BP 128/79 | HR 71 | Ht 61.0 in | Wt 228.6 lb

## 2015-01-25 DIAGNOSIS — N951 Menopausal and female climacteric states: Secondary | ICD-10-CM

## 2015-01-25 DIAGNOSIS — N8110 Cystocele, unspecified: Secondary | ICD-10-CM | POA: Diagnosis not present

## 2015-01-25 DIAGNOSIS — N816 Rectocele: Secondary | ICD-10-CM | POA: Diagnosis not present

## 2015-01-25 DIAGNOSIS — R339 Retention of urine, unspecified: Secondary | ICD-10-CM

## 2015-01-25 DIAGNOSIS — IMO0002 Reserved for concepts with insufficient information to code with codable children: Secondary | ICD-10-CM

## 2015-01-25 DIAGNOSIS — N390 Urinary tract infection, site not specified: Secondary | ICD-10-CM | POA: Diagnosis not present

## 2015-01-25 MED ORDER — CEPHALEXIN 500 MG PO CAPS: 500.0000 mg | ORAL_CAPSULE | Freq: Two times a day (BID) | ORAL | Status: DC

## 2015-01-25 NOTE — Progress Notes (Signed)
Patient ID: Kathryn Kramer, female   DOB: [birth year], 64 y.o.   MRN: [id number] Pessary insertion  Chief complaint: 1.  UTI. 2  Pessary insertion. 3.  Cystocele. 4.  Incomplete bladder emptying.  Patient presents for pessary insertion..  The incontinence dish with support, is to be inserted today. Patient had urology evaluation, and was to undergo cystoscopy; urine was abnormal; urine culture demonstrates Escherichia coli UTI with multiple  Resistance to antibiotics.  Past medical history, past surgical history, problem list, medications, and allergies are reviewed.  Review of systems: No flank pain. Fevers, chills or sweats. Urinary incontinence and incomplete bladder emptying, ongoing. No vaginal bleeding   OBJECTIVE: BP 128/79 mmHg  Pulse 71  Ht 5\' 1"  (1.549 m)  Wt 228 lb 9.6 oz (103.692 kg)  BMI 43.22 kg/m2 Pleasant elderly female in no acute distress. She is a suboptimal historian. Back: No CVA teness. Abdomen: No organomegaly,; soft, nontender. Pelvic exam: External genitalia-normal BUS-normal. Vagina-atrophic changes, moderate;First to second-degree cystocele; mild rectocele Cervix-no lesions. Uterus-midplane, normal size and shape;No significant uterine descensus. Adnexa-nonpalpable, nontender Rectovaginal-normal External exam  Procedure: Incontinence dish with support pessary is inserted  ASSESSMENT: 1. Symptomatic cystocele, first to second degree with urinary incontinence including SUI, spontaneous Urinary leakage, as well as incomplete emptying. 2. Menopausal state. 3. Vaginal atrophy 4. Tobacco user 5. Diabetes mellitus 6.  UTI  PLAN: 1.  2-3/4 inch incontinence dish with Notch pessary is inserted 2.  Return in 1 week for recheck. 3.  Keflex 500 mg twice a day for 7 days

## 2015-01-30 ENCOUNTER — Telehealth: Payer: Self-pay

## 2015-01-30 DIAGNOSIS — N390 Urinary tract infection, site not specified: Secondary | ICD-10-CM

## 2015-01-30 MED ORDER — TETRACYCLINE HCL 500 MG PO CAPS: 500.0000 mg | ORAL_CAPSULE | Freq: Two times a day (BID) | ORAL | Status: AC

## 2015-01-30 NOTE — Telephone Encounter (Signed)
Please start patient on tetracycline 500 mg BID for 7 days. Bb  Spoke with pt in reference to +ucx. Pt stated Dr. Keturah Barre gave her Keflex on 01/25/15. Nurse made pt aware Dr. Pilar Jarvis wanted her to have tetracycline. Pt voiced understanding. Medication sent to pt pharmacy.

## 2015-01-30 NOTE — Telephone Encounter (Signed)
Kathryn Kramer from [facility] called stating they can not get tetracycline and requested a new medication. Per Dr. Pilar Jarvis pt can have keflex bid x7days. Pharmacy took a verbal order for keflex.

## 2015-02-01 ENCOUNTER — Encounter: Payer: Self-pay | Admitting: Obstetrics and Gynecology

## 2015-02-01 ENCOUNTER — Ambulatory Visit (INDEPENDENT_AMBULATORY_CARE_PROVIDER_SITE_OTHER): Payer: Medicare Other | Admitting: Obstetrics and Gynecology

## 2015-02-01 DIAGNOSIS — IMO0002 Reserved for concepts with insufficient information to code with codable children: Secondary | ICD-10-CM

## 2015-02-01 DIAGNOSIS — N8110 Cystocele, unspecified: Secondary | ICD-10-CM

## 2015-02-01 DIAGNOSIS — R339 Retention of urine, unspecified: Secondary | ICD-10-CM

## 2015-02-01 DIAGNOSIS — N39490 Overflow incontinence: Secondary | ICD-10-CM

## 2015-02-01 DIAGNOSIS — Z4689 Encounter for fitting and adjustment of other specified devices: Secondary | ICD-10-CM | POA: Diagnosis not present

## 2015-02-01 NOTE — Patient Instructions (Signed)
1.  The pessary was removed, cleaned, and reinserted. 2.  Use the TRIMO san gel and Premarin cream weekly in the vagina. 3.  Encouraged voiding regularly order to minimize bladder overflow incontinence. 4.  Return in 4 weeks.

## 2015-02-01 NOTE — Progress Notes (Signed)
Patient ID: Kathryn Kramer, female   DOB: [birth year], 64 y.o.   MRN: [id number]   Chief: 1.  Cystocele. 2.  Incomplete bladder emptying.  Pessary check- 1 week- incont. Dish with notch No vb,vp, or d/c Patient states that she feels that she empties her bladder a little bit better.  However, she does notice episodes of incontinence when she gets up and goes walking around, She leaks.  Past medical history: Past surgical history, problem list, medications, and allergies are reviewed  OBJECTIVE: There were no vitals taken for this visit.  Pleasant elderly female in no acute distress. She is a suboptimal historian. Back: No CVA teness. Abdomen: No organomegaly,; soft, nontender. Pelvic exam: External genitalia-normal BUS-normal. Vagina-atrophic changes, moderate; First to second-degree cystocele; mild rectocele; No ulcerations or abrasions; no discharge Cervix-no lesions. Uterus-midplane, normal size and shape;No significant uterine descensus. Adnexa-nonpalpable, nontender Rectovaginal-normal External exam  Procedure: Incontinence dish with support pessary is Removed, cleaned, and reinserted  ASSESSMENT: 1. Symptomatic cystocele, first to second degree with urinary incontinence including SUI, spontaneous Urinary leakage, as well as incomplete emptying. 2. Menopausal state. 3. Vaginal atrophy 4. Tobacco user 5. Diabetes mellitus  PLAN: 1.  Pessary is removed, cleaned, and reinserted. 2.  Use TRIMO San gel and Premarin cream Weekly 3.  Timed voiding  In order to minimize bladder volume and incontinence 4.  Return in 4 weeks for recheck  Brayton Mars, MD  Note: This dictation was prepared with Dragon dictation along with smaller phrase technology. Any transcriptional errors that result from this process are unintentional.

## 2015-02-10 ENCOUNTER — Encounter: Payer: Self-pay | Admitting: Urology

## 2015-02-10 ENCOUNTER — Ambulatory Visit: Payer: Medicare Other | Admitting: Urology

## 2015-02-10 ENCOUNTER — Telehealth: Payer: Self-pay | Admitting: Urology

## 2015-02-10 VITALS — BP 175/88 | HR 64 | Ht 61.0 in | Wt 234.1 lb

## 2015-02-10 DIAGNOSIS — N390 Urinary tract infection, site not specified: Secondary | ICD-10-CM

## 2015-02-10 LAB — URINALYSIS, COMPLETE
Bilirubin, UA: NEGATIVE
GLUCOSE, UA: NEGATIVE
Ketones, UA: NEGATIVE
NITRITE UA: POSITIVE — AB
PH UA: 7 (ref 5.0–7.5)
Protein, UA: NEGATIVE
Specific Gravity, UA: 1.01 (ref 1.005–1.030)
UUROB: 0.2 mg/dL (ref 0.2–1.0)

## 2015-02-10 LAB — MICROSCOPIC EXAMINATION

## 2015-02-10 MED ORDER — LIDOCAINE HCL 2 % EX GEL: 1.0000 "application " | Freq: Once | CUTANEOUS | Status: DC

## 2015-02-10 MED ORDER — CIPROFLOXACIN HCL 500 MG PO TABS: 500.0000 mg | ORAL_TABLET | Freq: Once | ORAL | Status: DC

## 2015-02-10 NOTE — Telephone Encounter (Signed)
I attempted to contact the pt twice, but she was unavailable. I spoke w/ the pt daughter and she said she spoke with her mother and she's not able to void right now.She will call back Monday to let us know when she can give [place] an sample.

## 2015-02-13 NOTE — Telephone Encounter (Signed)
I called and spoke w/ the Kathryn Kramer and she informed me that she cannot come in to give [place] a specimen, because she currently doesn't have transportation. The Kathryn Kramer stated she got an appt scheduled on Thursday, Dec.15th at [facility]. I spoke w/ Dr. Erlene Quan and per her recommendation she can get her urine check and sent out for culture and we can treat. Kathryn Kramer notified

## 2015-02-17 NOTE — Telephone Encounter (Signed)
I called and spoke w/ the pt to f/u to see if her PCP did UA & urine cx on Thursday. She informed me that they said they will call her when the results come in.

## 2015-02-28 ENCOUNTER — Other Ambulatory Visit: Payer: Medicare Other | Admitting: Urology

## 2015-03-07 ENCOUNTER — Encounter: Payer: Self-pay | Admitting: Obstetrics and Gynecology

## 2015-03-07 ENCOUNTER — Ambulatory Visit (INDEPENDENT_AMBULATORY_CARE_PROVIDER_SITE_OTHER): Payer: Medicare Other | Admitting: Obstetrics and Gynecology

## 2015-03-07 VITALS — BP 134/70 | HR 56 | Ht 61.0 in | Wt 233.7 lb

## 2015-03-07 DIAGNOSIS — R300 Dysuria: Secondary | ICD-10-CM | POA: Diagnosis not present

## 2015-03-07 DIAGNOSIS — N39490 Overflow incontinence: Secondary | ICD-10-CM | POA: Diagnosis not present

## 2015-03-07 DIAGNOSIS — R339 Retention of urine, unspecified: Secondary | ICD-10-CM

## 2015-03-07 DIAGNOSIS — N952 Postmenopausal atrophic vaginitis: Secondary | ICD-10-CM

## 2015-03-07 DIAGNOSIS — N8110 Cystocele, unspecified: Secondary | ICD-10-CM | POA: Diagnosis not present

## 2015-03-07 DIAGNOSIS — IMO0002 Reserved for concepts with insufficient information to code with codable children: Secondary | ICD-10-CM

## 2015-03-07 LAB — POCT URINALYSIS DIPSTICK
BILIRUBIN UA: NEGATIVE
GLUCOSE UA: NEGATIVE
Ketones, UA: NEGATIVE
Nitrite, UA: NEGATIVE
UROBILINOGEN UA: 0.2
pH, UA: 7

## 2015-03-07 NOTE — Progress Notes (Signed)
Chief complaint: 1.  Systematic cystocele. 2.  Urinary incontinence. 3.  Pessary check.  Patient presents for 1 month pessary check following placement of incontinence dish with notch pessary. The goal was to provide better complete bladder emptying as well as reduce leakage. Over the past 4 weeks she has noted uncontrolled urination every time she would stand.  She is voiding every 3 hours with the dish in place.  She is very dissatisfied with this intervention.  Comorbidities include vaginal atrophy, tobacco use, Obesity, and diabetes mellitus.  Past history, past surgical history, problem list, medications, and allergies are reviewed.  OBJECTIVE: BP 134/70 mmHg  Pulse 56  Ht 5\' 1"  (1.549 m)  Wt 233 lb 11.2 oz (106.006 kg)  BMI 44.18 kg/m2 Pleasant elderly female in no acute distress.  Back: No CVA teness. Abdomen: No organomegaly,; soft, nontender. Pelvic exam: External genitalia-normal BUS-normal. Vagina-atrophic changes, moderate; First to second-degree cystocele; mild rectocele; No ulcerations or abrasions; no discharge. The pessary is removed and not reinserted Cervix-no lesions. Uterus-midplane, normal size and shape;No significant uterine descensus. Adnexa-nonpalpable, nontender Rectovaginal-normal External exam  ASSESSMENT: 1. Symptomatic cystocele, first to second degree with urinary incontinence including SUI, spontaneous Urinary leakage, as well as incomplete emptying. 2. Menopausal state. 3. Vaginal atrophy 4. Tobacco user 5. Diabetes mellitus 6.  UNSUCCESSFUL Pessary trial 7.  Hematuria  PLAN: 1.  Remove pessary. 2.  Follow up with urology for further evaluation for possible sling procedure 3.  Follow-up in 4 weeks for further management planning. 4.  Urine culture  A total of 15 minutes were spent face-to-face with the patient during this encounter and over half of that time dealt with counseling and coordination of care.  Brayton Mars,  MD  Note: This dictation was prepared with Dragon dictation along with smaller phrase technology. Any transcriptional errors that result from this process are unintentional.

## 2015-03-07 NOTE — Patient Instructions (Addendum)
1.  The pessary is left out because it is not helping prevent urine leakage.  The problem actually got worse. 2.  Return in 4 weeks Following urology evaluation. 3.  There was blood in her urine.  Urine culture is sent to rule out bladder infection.

## 2015-03-08 NOTE — Telephone Encounter (Signed)
I called pt to f/u on Urine Culture she had a UA done by dr. Achilles Dunk on yesterday that was sent for culture. I informed the pt that she still needs to come in for the Cystoscopy pending results from the culture. The pt scheduled an appt w/ Dr. Pilar Jarvis on Thursday, 03/23/15.

## 2015-03-09 ENCOUNTER — Other Ambulatory Visit: Payer: Medicare Other

## 2015-03-09 LAB — URINE CULTURE

## 2015-03-10 ENCOUNTER — Telehealth: Payer: Self-pay

## 2015-03-10 MED ORDER — CEPHALEXIN 500 MG PO CAPS: 500.0000 mg | ORAL_CAPSULE | Freq: Two times a day (BID) | ORAL | Status: DC

## 2015-03-10 NOTE — Telephone Encounter (Signed)
-----   Message from Brayton Mars, MD sent at 03/10/2015  3:00 PM EST ----- Please notify - Abnormal Labs Please: Keflex 500 mg twice a day for 7 days.  This antibiotic is only intermediate is effective, so I do want her to give [place] a urine specimen.  After completion of her antibiotic regimen to be sure that the infection was treated.

## 2015-03-10 NOTE — Telephone Encounter (Signed)
Pts daughter Elwyn Lade aware. Unable to reach pt- number busy. Med erx to [facility] per daughter.

## 2015-03-23 ENCOUNTER — Ambulatory Visit (INDEPENDENT_AMBULATORY_CARE_PROVIDER_SITE_OTHER): Payer: Medicare Other | Admitting: Urology

## 2015-03-23 VITALS — BP 173/84 | HR 59 | Ht 61.0 in | Wt 228.1 lb

## 2015-03-23 DIAGNOSIS — N390 Urinary tract infection, site not specified: Secondary | ICD-10-CM

## 2015-03-23 DIAGNOSIS — N952 Postmenopausal atrophic vaginitis: Secondary | ICD-10-CM | POA: Diagnosis not present

## 2015-03-23 DIAGNOSIS — N3946 Mixed incontinence: Secondary | ICD-10-CM

## 2015-03-23 DIAGNOSIS — N8111 Cystocele, midline: Secondary | ICD-10-CM | POA: Diagnosis not present

## 2015-03-23 LAB — URINALYSIS, COMPLETE
Bilirubin, UA: NEGATIVE
Glucose, UA: NEGATIVE
Ketones, UA: NEGATIVE
Nitrite, UA: NEGATIVE
PH UA: 7.5 (ref 5.0–7.5)
PROTEIN UA: NEGATIVE
Specific Gravity, UA: 1.01 (ref 1.005–1.030)
Urobilinogen, Ur: 0.2 mg/dL (ref 0.2–1.0)

## 2015-03-23 LAB — MICROSCOPIC EXAMINATION

## 2015-03-23 NOTE — Progress Notes (Signed)
03/23/2015 2:28 PM   Kathryn Kramer 19-May-1950 [id number]  Referring provider: No referring provider defined for this encounter.  Chief Complaint  Patient presents with  . Cysto    HPI: Patient is a 65 year old African American female who presents today for examination of her vaginal mucosa after being prescribed vaginal estrogen cream.   She states she is been using the cream as prescribed. She is applying a pea-sized amount with her fingertip to external vaginal area every night. She states the cream has not given her a rash or irritation.  She is experiencing symptoms of urinary tract infection. She is having dysuria, but she denies any gross hematuria or suprapubic pain. She also denies any fevers, chills, nausea or vomiting. Her UA today is suspicious for infection. Per patient report, she does suffer with recurrent urinary tract infections. We have requested this laboratory data, though still have not yet received those records.  She does have incontinence which has improvement with the Vesicare 10 mg daily. She is continuing with that medication.  She also has a cystocele and has an upcoming appointment with [facility] for pessary fitting.   Interval history: The patient saw her gynecologist and was fitted with a pessary however she developed stress urinary incontinence. She continues to have urinary tract infections. She was treated for one 2 weeks ago. Vesicare is working well for her urge incontinence but she does no stress incontinence as well. This was seen again during her pessary fitting.  PMH: Past Medical History  Diagnosis Date  . CHF (congestive heart failure) ([place])   . Diabetes mellitus without complication ([place])   . Hypertension   . Chronic kidney disease   . Stroke ([place]) 2001  . Constipation   . Female incontinence   . Acid reflux   . Anxiety   . Arrhythmia   . Recurrent UTI   . Cystocele, grade 2   . CCF (congestive cardiac failure) ([place]) 08/04/2013    . BP (high blood pressure) 01/25/2013    Surgical History: Past Surgical History  Procedure Laterality Date  . Tubal ligation  1985  . Hand surgery Bilateral [id number]    carpel tunnel  . Colonoscopy  02/16/13  . Hip surgery Right 1999  . Multiple tooth extractions  2014  . Elbow      pinch nerve    Home Medications:    Medication List       This list is accurate as of: 03/23/15  2:28 PM.  Always use your most recent med list.               amLODipine 10 MG tablet  Commonly known as:  NORVASC  Take 1 tablet by mouth daily.     aspirin 81 MG tablet  Take 81 mg by mouth daily.     atorvastatin 20 MG tablet  Commonly known as:  LIPITOR  Take 1 tablet by mouth daily.     baclofen 10 MG tablet  Commonly known as:  LIORESAL  Take 1 tablet by mouth 3 (three) times daily.     benazepril 20 MG tablet  Commonly known as:  LOTENSIN  Take 1 tablet by mouth daily.     buPROPion 150 MG 12 hr tablet  Commonly known as:  WELLBUTRIN SR     CALCIUM + D PO  Take 2 tablets by mouth daily.     carvedilol 12.5 MG tablet  Commonly known as:  COREG  Take 12.5 mg by mouth 2 (  two) times daily with a meal.     citalopram 40 MG tablet  Commonly known as:  CELEXA  Take 1 tablet by mouth daily.     clonazePAM 0.5 MG tablet  Commonly known as:  KLONOPIN     cloNIDine 0.3 MG tablet  Commonly known as:  CATAPRES  Take 0.3 mg by mouth 2 (two) times daily.     estradiol 0.1 MG/GM vaginal cream  Commonly known as:  ESTRACE  Place 1 Applicatorful vaginally at bedtime. Patient was given a sample of vaginal estrogen cream and instructed to apply 0.5mg  (pea-sized amount)  just inside the vaginal introitus with a finger-tip every night for two weeks and then Monday, Wednesday and Friday nights.     furosemide 20 MG tablet  Commonly known as:  LASIX  Take 1 tablet by mouth 2 (two) times daily.     glipiZIDE 5 MG tablet  Commonly known as:  GLUCOTROL  Take 1 tablet by mouth daily.      insulin glargine 100 UNIT/ML injection  Commonly known as:  LANTUS  Inject 20 Units into the skin daily.     MONOJECT INSULIN SYRINGE 31G X 5/16" 0.5 ML Misc  Generic drug:  Insulin Syringe-Needle U-100     omeprazole 20 MG capsule  Commonly known as:  PRILOSEC  Take 2 capsules by mouth daily.     polyethylene glycol powder powder  Commonly known as:  GLYCOLAX/MIRALAX  255 grams one bottle for colonoscopy prep     PROAIR HFA 108 (90 Base) MCG/ACT inhaler  Generic drug:  albuterol  Inhale 2 puffs into the lungs every 8 (eight) hours.     QUEtiapine 50 MG tablet  Commonly known as:  SEROQUEL  Take 1 tablet by mouth daily.     solifenacin 10 MG tablet  Commonly known as:  VESICARE  Take 1 tablet (10 mg total) by mouth daily.        Allergies:  Allergies  Allergen Reactions  . Pravastatin Itching    Per patient "itching on the bottom of feet"    Family History: Family History  Problem Relation Age of Onset  . Colon cancer Father   . Colon polyps Brother   . Colon polyps Daughter   . Diabetes Daughter   . Kidney disease Neg Hx   . Bladder Cancer Neg Hx   . Breast cancer Neg Hx   . Heart disease Neg Hx   . Ovarian cancer Neg Hx   . Diabetes Mother     Social History:  reports that she has been smoking Cigarettes.  She has a 10 pack-year smoking history. She has never used smokeless tobacco. She reports that she does not drink alcohol or use illicit drugs.  ROS:                                        Physical Exam: BP 173/84 mmHg  Pulse 59  Ht 5\' 1"  (1.549 m)  Wt 228 lb 1.6 oz (103.465 kg)  BMI 43.12 kg/m2  Constitutional:  Alert and oriented, No acute distress. HEENT: [place] AT, moist mucus membranes.  Trachea midline, no masses. Cardiovascular: No clubbing, cyanosis, or edema. Respiratory: Normal respiratory effort, no increased work of breathing. GI: Abdomen is soft, nontender, nondistended, no abdominal masses GU: No CVA  tenderness.  Skin: No rashes, bruises or suspicious lesions. Lymph: No cervical or inguinal  adenopathy. Neurologic: Grossly intact, no focal deficits, moving all 4 extremities. Psychiatric: Normal mood and affect.  Laboratory Data: Lab Results  Component Value Date   WBC 12.9* 10/02/2013   HGB 12.0 10/02/2013   HCT 37.2 10/02/2013   MCV 87 10/02/2013   PLT 267 10/02/2013    Lab Results  Component Value Date   CREATININE 2.83* 10/02/2013    No results found for: PSA  No results found for: TESTOSTERONE  No results found for: HGBA1C  Urinalysis    Component Value Date/Time   COLORURINE Straw 06/24/2014 1859   APPEARANCEUR Hazy 06/24/2014 1859   LABSPEC 1.003 06/24/2014 1859   PHURINE 6.0 06/24/2014 1859   GLUCOSEU Negative 02/10/2015 1513   GLUCOSEU 50 mg/dL 06/24/2014 1859   HGBUR 3+ 06/24/2014 1859   BILIRUBINUR neg 03/07/2015 1527   BILIRUBINUR Negative 02/10/2015 1513   BILIRUBINUR Negative 06/24/2014 1859   KETONESUR Negative 06/24/2014 1859   PROTEINUR trace 03/07/2015 1527   PROTEINUR 30 mg/dL 06/24/2014 1859   UROBILINOGEN 0.2 03/07/2015 1527   NITRITE neg 03/07/2015 1527   NITRITE Positive* 02/10/2015 1513   NITRITE Negative 06/24/2014 1859   LEUKOCYTESUR moderate (2+)* 03/07/2015 1527   LEUKOCYTESUR 3+* 02/10/2015 1513   LEUKOCYTESUR 3+ 06/24/2014 1859     Assessment & Plan:    1. Mixed Urinary Incontinence: - Patient has found relief from her urge incontinence with the Vesicare 10 mg daily. She will continue that medication.  -She also has stress urinary incontinence when her cystocele was reduced with a pessary. We will get urodynamics studies and have her follow Dr. Matilde Sprang for possible urethral sling/anterior repair.  2. Cystocele, grade 2: as above  3. Atrophic vaginitis: Continue vaginal estrogen cream0.5mg  (pea-sized amount) just inside the vaginal introitus with a finger-tip every on Monday, Wednesday and Friday nights.   4.  Recurrent UTI's: Check culture today   Return for after urodynamics with Dr. Matilde Sprang with possible cystoscopy.  Nickie Retort, MD  [facility] [address] [place], [place] [address] [phone]

## 2015-03-25 LAB — CULTURE, URINE COMPREHENSIVE

## 2015-04-04 ENCOUNTER — Ambulatory Visit (INDEPENDENT_AMBULATORY_CARE_PROVIDER_SITE_OTHER): Payer: Medicare Other | Admitting: Obstetrics and Gynecology

## 2015-04-04 ENCOUNTER — Encounter: Payer: Self-pay | Admitting: Obstetrics and Gynecology

## 2015-04-04 VITALS — BP 152/79 | HR 69 | Ht 61.0 in | Wt 231.5 lb

## 2015-04-04 DIAGNOSIS — R300 Dysuria: Secondary | ICD-10-CM

## 2015-04-04 DIAGNOSIS — N393 Stress incontinence (female) (male): Secondary | ICD-10-CM

## 2015-04-04 DIAGNOSIS — N8110 Cystocele, unspecified: Secondary | ICD-10-CM | POA: Diagnosis not present

## 2015-04-04 DIAGNOSIS — R339 Retention of urine, unspecified: Secondary | ICD-10-CM

## 2015-04-04 DIAGNOSIS — IMO0002 Reserved for concepts with insufficient information to code with codable children: Secondary | ICD-10-CM

## 2015-04-04 LAB — POCT URINALYSIS DIPSTICK
BILIRUBIN UA: NEGATIVE
Blood, UA: NEGATIVE
GLUCOSE UA: NEGATIVE
KETONES UA: NEGATIVE
LEUKOCYTES UA: NEGATIVE
Nitrite, UA: NEGATIVE
PROTEIN UA: NEGATIVE
Urobilinogen, UA: 0.2
pH, UA: 7

## 2015-04-04 NOTE — Progress Notes (Signed)
S:Patient here for follow up after urologist visit to discuss plan of care for mixed incontinence. She has also experienced some UTI symptoms and had had a UA done with urologist. Burning with urination still remains, denies pain or other urinary symptoms. Patient has been using diapers to manage her urge incontinence and has not experienced any rashes or skin irritation.    P: Patient scheduled for urethral sling/anterior repair evaluation with Dr. Matilde Sprang next week. To follow up with urologist for management of stress incontinence.  Patient UA results with urologist were negative for a UTI.  Patient to continue Vesicare 10mg  daily as treatment for urge incontinence. Continue Estrace for vaginal atrophy.  A total of 15 minutes were spent face-to-face with the patient during this encounter and over half of that time dealt with counseling and coordination of care.  Cathlean Sauer, PA-S Brayton Mars, MD   I have seen, interviewed, and examined the patient in conjunction with the [facility].A. student and affirm the diagnosis and management plan. Jaedah Lords A. Nikoleta Dady, MD, FACOG   Note: This dictation was prepared with Dragon dictation along with smaller phrase technology. Any transcriptional errors that result from this process are unintentional.

## 2015-04-04 NOTE — Patient Instructions (Signed)
1.  Continue Vesicare 10 mg daily 2.  Follow up as scheduled for urology evaluation and surgery for stress urinary incontinence.

## 2015-04-05 LAB — URINE CULTURE

## 2015-04-14 ENCOUNTER — Other Ambulatory Visit: Payer: Medicare Other

## 2015-05-03 ENCOUNTER — Other Ambulatory Visit: Payer: Self-pay | Admitting: Family Medicine

## 2015-05-03 DIAGNOSIS — M810 Age-related osteoporosis without current pathological fracture: Secondary | ICD-10-CM

## 2015-05-12 ENCOUNTER — Other Ambulatory Visit: Payer: Medicare Other

## 2015-05-29 ENCOUNTER — Ambulatory Visit (INDEPENDENT_AMBULATORY_CARE_PROVIDER_SITE_OTHER): Payer: Medicare Other | Admitting: Urology

## 2015-05-29 ENCOUNTER — Encounter: Payer: Self-pay | Admitting: Urology

## 2015-05-29 VITALS — BP 176/101 | HR 73 | Ht 61.0 in | Wt 233.2 lb

## 2015-05-29 DIAGNOSIS — N390 Urinary tract infection, site not specified: Secondary | ICD-10-CM

## 2015-05-29 LAB — URINALYSIS, COMPLETE
Bilirubin, UA: NEGATIVE
GLUCOSE, UA: NEGATIVE
Ketones, UA: NEGATIVE
Nitrite, UA: NEGATIVE
PH UA: 7 (ref 5.0–7.5)
Protein, UA: NEGATIVE
Specific Gravity, UA: 1.015 (ref 1.005–1.030)
Urobilinogen, Ur: 0.2 mg/dL (ref 0.2–1.0)

## 2015-05-29 LAB — MICROSCOPIC EXAMINATION: RBC, UA: NONE SEEN /hpf (ref 0–?)

## 2015-05-29 MED ORDER — CIPROFLOXACIN HCL 500 MG PO TABS
500.0000 mg | ORAL_TABLET | Freq: Once | ORAL | Status: AC
  Administered 2015-05-29: 500 mg via ORAL

## 2015-05-29 MED ORDER — LIDOCAINE HCL 2 % EX GEL
1.0000 "application " | Freq: Once | CUTANEOUS | Status: AC
  Administered 2015-05-29: 1 via URETHRAL

## 2015-05-29 NOTE — Progress Notes (Signed)
05/29/2015 12:10 PM   Kathryn Kramer 1950-08-28 [id number]  Referring provider: No referring provider defined for this encounter.  Chief Complaint  Patient presents with  . Cysto    recurrent UTI    HPI: The patient recently was assessed by Dr. Sharin Grave for vaginal atrophy and a possible urinary tract infection.  She has a pessary from her gynecologist. Her incontinence was improved on Vesicare 10 mg. Apparently she developed stress incontinence after the pessary was placed.  The patient does not have a symptomatic cystocele. She was actually here for cystoscopy. She leaks with coughing sneezing and also with urgency. Both are significant. She says she can soak 5 or 6 pads a day. She gets up 2-3 times a night. She voids approximately every 2 archer in the day. She's had a stroke. She's not had previous GU surgery or kidney stones.  She was not clinically infected today  She underwent flexible cystoscopy utilizing sterile technique. Consent was given. The bladder mucosa and trigone were normal. The mucosa was normal. There was no stitch or foreign body or carcinoma or cystitis. She tolerated procedure well  On pelvic examination she a grade 2 hypermobility the bladder neck and a negative cough test after cystoscopy. She had a high small grade 2 cystocele and her vaginal cuff was well supported     PMH: Past Medical History  Diagnosis Date  . CHF (congestive heart failure) ([place])   . Diabetes mellitus without complication ([place])   . Hypertension   . Chronic kidney disease   . Stroke ([place]) 2001  . Constipation   . Female incontinence   . Acid reflux   . Anxiety   . Arrhythmia   . Recurrent UTI   . Cystocele, grade 2   . CCF (congestive cardiac failure) ([place]) 08/04/2013  . BP (high blood pressure) 01/25/2013    Surgical History: Past Surgical History  Procedure Laterality Date  . Tubal ligation  1985  . Hand surgery Bilateral [id number]    carpel tunnel  . Colonoscopy   02/16/13  . Hip surgery Right 1999  . Multiple tooth extractions  2014  . Elbow      pinch nerve    Home Medications:    Medication List       This list is accurate as of: 05/29/15 12:10 PM.  Always use your most recent med list.               amLODipine 10 MG tablet  Commonly known as:  NORVASC  Take 1 tablet by mouth daily.     aspirin 81 MG tablet  Take 81 mg by mouth daily.     atorvastatin 20 MG tablet  Commonly known as:  LIPITOR  Take 1 tablet by mouth daily.     baclofen 10 MG tablet  Commonly known as:  LIORESAL  Take 1 tablet by mouth 3 (three) times daily.     benazepril 20 MG tablet  Commonly known as:  LOTENSIN  Take 1 tablet by mouth daily.     buPROPion 150 MG 12 hr tablet  Commonly known as:  WELLBUTRIN SR     CALCIUM + D PO  Take 2 tablets by mouth daily.     carvedilol 12.5 MG tablet  Commonly known as:  COREG  Take 12.5 mg by mouth 2 (two) times daily with a meal.     carvedilol 25 MG tablet  Commonly known as:  COREG  Reported on 05/29/2015  citalopram 40 MG tablet  Commonly known as:  CELEXA  Take 1 tablet by mouth daily.     clonazePAM 0.5 MG tablet  Commonly known as:  KLONOPIN     cloNIDine 0.1 mg/24hr patch  Commonly known as:  CATAPRES - Dosed in mg/24 hr  Place 0.1 mg onto the skin once a week.     cloNIDine 0.3 mg/24hr patch  Commonly known as:  CATAPRES - Dosed in mg/24 hr     estradiol 0.1 MG/GM vaginal cream  Commonly known as:  ESTRACE  Place 1 Applicatorful vaginally at bedtime. Patient was given a sample of vaginal estrogen cream and instructed to apply 0.5mg  (pea-sized amount)  just inside the vaginal introitus with a finger-tip every night for two weeks and then Monday, Wednesday and Friday nights.     estrogens (conjugated) 0.625 MG tablet  Commonly known as:  PREMARIN  Take 0.625 mg by mouth. Reported on 05/29/2015     furosemide 20 MG tablet  Commonly known as:  LASIX  Take 1 tablet by mouth 2 (two)  times daily.     glipiZIDE 5 MG tablet  Commonly known as:  GLUCOTROL  Take 1 tablet by mouth daily.     insulin glargine 100 UNIT/ML injection  Commonly known as:  LANTUS  Inject 20 Units into the skin daily.     MONOJECT INSULIN SYRINGE 31G X 5/16" 0.5 ML Misc  Generic drug:  Insulin Syringe-Needle U-100     omeprazole 20 MG capsule  Commonly known as:  PRILOSEC  Take 2 capsules by mouth daily.     polyethylene glycol powder powder  Commonly known as:  GLYCOLAX/MIRALAX  255 grams one bottle for colonoscopy prep     PROAIR HFA 108 (90 Base) MCG/ACT inhaler  Generic drug:  albuterol  Inhale 2 puffs into the lungs every 8 (eight) hours.     QUEtiapine 50 MG tablet  Commonly known as:  SEROQUEL  Take 1 tablet by mouth daily.     solifenacin 10 MG tablet  Commonly known as:  VESICARE  Take 1 tablet (10 mg total) by mouth daily.        Allergies:  Allergies  Allergen Reactions  . Pravastatin Itching    Per patient "itching on the bottom of feet"    Family History: Family History  Problem Relation Age of Onset  . Colon cancer Father   . Colon polyps Brother   . Colon polyps Daughter   . Diabetes Daughter   . Kidney disease Neg Hx   . Bladder Cancer Neg Hx   . Breast cancer Neg Hx   . Heart disease Neg Hx   . Ovarian cancer Neg Hx   . Diabetes Mother     Social History:  reports that she has been smoking Cigarettes.  She has a 10 pack-year smoking history. She has never used smokeless tobacco. She reports that she does not drink alcohol or use illicit drugs.  ROS:                                        Physical Exam: BP 176/101 mmHg  Pulse 73  Ht 5\' 1"  (1.549 m)  Wt 233 lb 3.2 oz (105.779 kg)  BMI 44.09 kg/m2    Laboratory Data: Lab Results  Component Value Date   WBC 12.9* 10/02/2013   HGB 12.0 10/02/2013   HCT  37.2 10/02/2013   MCV 87 10/02/2013   PLT 267 10/02/2013    Lab Results  Component Value Date   CREATININE  2.83* 10/02/2013    No results found for: PSA  No results found for: TESTOSTERONE  No results found for: HGBA1C  Urinalysis    Component Value Date/Time   COLORURINE Straw 06/24/2014 1859   APPEARANCEUR Clear 03/23/2015 1410   APPEARANCEUR Hazy 06/24/2014 1859   LABSPEC 1.003 06/24/2014 1859   PHURINE 6.0 06/24/2014 1859   GLUCOSEU Negative 03/23/2015 1410   GLUCOSEU 50 mg/dL 06/24/2014 1859   HGBUR 3+ 06/24/2014 1859   BILIRUBINUR neg 04/04/2015 1546   BILIRUBINUR Negative 03/23/2015 1410   BILIRUBINUR Negative 06/24/2014 1859   KETONESUR Negative 06/24/2014 1859   PROTEINUR neg 04/04/2015 1546   PROTEINUR Negative 03/23/2015 1410   PROTEINUR 30 mg/dL 06/24/2014 1859   UROBILINOGEN 0.2 04/04/2015 1546   NITRITE neg 04/04/2015 1546   NITRITE Negative 03/23/2015 1410   NITRITE Negative 06/24/2014 1859   LEUKOCYTESUR Negative 04/04/2015 1546   LEUKOCYTESUR 2+* 03/23/2015 1410   LEUKOCYTESUR 3+ 06/24/2014 1859    Pertinent Imaging: The patient had urodynamics. Her max capacity was 720 mL. Her initial residual when she was catheterized was 420 mL but she had not voided. Her bladder was unstable reaching pressures of 9 cm of water associated with mild incontinence. At 300 mL her cough leak point pressure was 67 cm of water. At 500 mL it was 60 cm of water. She was triggering some leaks with coughing at high volumes. During voluntary voiding she voided 645 mL with a maximum flow of 25 mils per second. Maximum voiding pressure 17 cm water. A residual 75 mL. She did increase in EMG activity during voiding. Bladder neck dissented 2 cm. Her bladder was hyposensitive. The details of the urodynamics or Simon dictated.  Assessment & Plan:  The patient has mixed urinary incontinence. She does not have symptomatic prolapse and no longer has a pessary..  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 I want to try her on mirabegron better. I history she has responded  some to [employer].  Reevaluate in 4 weeks on mirabegron  1. Recurrent UTI 2. Mixed stress and urge incontinence - Urinalysis, Complete   No Follow-up on file.  Reece Packer, MD  [facility] [address] [place], [place] [address] [phone]

## 2015-06-12 ENCOUNTER — Ambulatory Visit (INDEPENDENT_AMBULATORY_CARE_PROVIDER_SITE_OTHER): Payer: Medicare Other | Admitting: Psychiatry

## 2015-06-12 ENCOUNTER — Encounter: Payer: Self-pay | Admitting: Psychiatry

## 2015-06-12 VITALS — BP 138/78 | HR 79 | Temp 97.3°F | Ht 61.0 in | Wt 232.4 lb

## 2015-06-12 DIAGNOSIS — F203 Undifferentiated schizophrenia: Secondary | ICD-10-CM | POA: Diagnosis not present

## 2015-06-12 DIAGNOSIS — F331 Major depressive disorder, recurrent, moderate: Secondary | ICD-10-CM

## 2015-06-12 MED ORDER — CLONAZEPAM 0.5 MG PO TABS: 0.2500 mg | ORAL_TABLET | Freq: Two times a day (BID) | ORAL | Status: DC

## 2015-06-12 MED ORDER — QUETIAPINE FUMARATE 100 MG PO TABS: 100.0000 mg | ORAL_TABLET | Freq: Every day | ORAL | Status: DC

## 2015-06-12 MED ORDER — CITALOPRAM HYDROBROMIDE 40 MG PO TABS: 40.0000 mg | ORAL_TABLET | Freq: Every day | ORAL | Status: DC
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
## 2015-06-28 ENCOUNTER — Ambulatory Visit: Payer: Self-pay | Admitting: Urology

## 2015-06-28 ENCOUNTER — Encounter: Payer: Self-pay | Admitting: Urology

## 2015-07-03 ENCOUNTER — Ambulatory Visit (INDEPENDENT_AMBULATORY_CARE_PROVIDER_SITE_OTHER): Payer: Medicare Other | Admitting: Urology

## 2015-07-03 ENCOUNTER — Encounter: Payer: Self-pay | Admitting: Urology

## 2015-07-03 VITALS — BP 146/78 | HR 69 | Ht 61.0 in | Wt 233.2 lb

## 2015-07-03 DIAGNOSIS — N3946 Mixed incontinence: Secondary | ICD-10-CM

## 2015-07-03 DIAGNOSIS — N390 Urinary tract infection, site not specified: Secondary | ICD-10-CM

## 2015-07-03 LAB — URINALYSIS, COMPLETE
Bilirubin, UA: NEGATIVE
Glucose, UA: NEGATIVE
Ketones, UA: NEGATIVE
Nitrite, UA: POSITIVE — AB
PH UA: 7 (ref 5.0–7.5)
Specific Gravity, UA: 1.015 (ref 1.005–1.030)
Urobilinogen, Ur: 0.2 mg/dL (ref 0.2–1.0)

## 2015-07-03 LAB — MICROSCOPIC EXAMINATION
EPITHELIAL CELLS (NON RENAL): NONE SEEN /HPF (ref 0–10)
WBC, UA: >30 /hpf — AB (ref 0–?)

## 2015-07-03 LAB — BLADDER SCAN AMB NON-IMAGING: Scan Result: 17

## 2015-07-03 MED ORDER — MIRABEGRON ER 25 MG PO TB24: 25.0000 mg | ORAL_TABLET | Freq: Every day | ORAL | Status: AC

## 2015-07-03 NOTE — Progress Notes (Signed)
07/03/2015 10:44 AM   Kathryn Kramer Sep 08, 1950 [id number]  Referring provider: [facility] [address] [place], [place] [address]  Chief Complaint  Patient presents with  . Recurrent UTI    4 week follow up    HPI: Patient is a 65 year old African American female who was initiated on Myrbetriq 25 mg daily at her last visit one month ago who presents today for a follow up.  Patient was found to have mixed incontinence during a UDS. She was initiated on Myrbetriq by Dr. Vikki Ports one month ago. She states that is provided relief of her continence. She will like to continue the medication.    She does complain today of the recent onset of urinary frequency, urgency, dysuria and nocturia. She denied gross hematuria or suprapubic pain. She also denies any fevers, chills, nausea or vomiting.  He states she's been having these symptoms for the last several days.  Her UA today is nitrite positive with greater than 30 WBCs and greater than 30 RBCs per high-power field. Her PVR is 17 mL.   PMH: Past Medical History  Diagnosis Date  . CHF (congestive heart failure) ([place])   . Diabetes mellitus without complication ([place])   . Hypertension   . Chronic kidney disease   . Stroke ([place]) 2001  . Constipation   . Female incontinence   . Acid reflux   . Anxiety   . Arrhythmia   . Recurrent UTI   . Cystocele, grade 2   . CCF (congestive cardiac failure) ([place]) 08/04/2013  . BP (high blood pressure) 01/25/2013  . Diabetes mellitus type I [facility])     Surgical History: Past Surgical History  Procedure Laterality Date  . Tubal ligation  1985  . Hand surgery Bilateral [id number]    carpel tunnel  . Colonoscopy  02/16/13  . Hip surgery Right 1999  . Multiple tooth extractions  2014  . Elbow      pinch nerve    Home Medications:    Medication List       This list is accurate as of: 07/03/15 10:44 AM.  Always use your most recent med list.               amLODipine 10 MG  tablet  Commonly known as:  NORVASC  Take 1 tablet by mouth daily.     aspirin 81 MG tablet  Take 81 mg by mouth daily.     atorvastatin 20 MG tablet  Commonly known as:  LIPITOR  Take 1 tablet by mouth daily.     baclofen 10 MG tablet  Commonly known as:  LIORESAL  Take 1 tablet by mouth 3 (three) times daily.     benazepril 20 MG tablet  Commonly known as:  LOTENSIN  Take 1 tablet by mouth daily.     buPROPion 150 MG 12 hr tablet  Commonly known as:  WELLBUTRIN SR     CALCIUM + D PO  Take 2 tablets by mouth daily.     carvedilol 12.5 MG tablet  Commonly known as:  COREG  Take 12.5 mg by mouth 2 (two) times daily with a meal. Reported on 07/03/2015     carvedilol 25 MG tablet  Commonly known as:  COREG  Reported on 05/29/2015     citalopram 40 MG tablet  Commonly known as:  CELEXA  Take 1 tablet (40 mg total) by mouth daily.     clonazePAM 0.5 MG tablet  Commonly known as:  KLONOPIN  Take 0.5 tablets (0.25 mg total) by mouth 2 (two) times daily.     cloNIDine 0.1 mg/24hr patch  Commonly known as:  CATAPRES - Dosed in mg/24 hr  Place 0.1 mg onto the skin once a week. Reported on 07/03/2015     cloNIDine 0.3 mg/24hr patch  Commonly known as:  CATAPRES - Dosed in mg/24 hr     estradiol 0.1 MG/GM vaginal cream  Commonly known as:  ESTRACE  Place 1 Applicatorful vaginally at bedtime. Patient was given a sample of vaginal estrogen cream and instructed to apply 0.5mg  (pea-sized amount)  just inside the vaginal introitus with a finger-tip every night for two weeks and then Monday, Wednesday and Friday nights.     estrogens (conjugated) 0.625 MG tablet  Commonly known as:  PREMARIN  Take 0.625 mg by mouth. Reported on 07/03/2015     furosemide 20 MG tablet  Commonly known as:  LASIX  Take 1 tablet by mouth 2 (two) times daily.     glipiZIDE 5 MG tablet  Commonly known as:  GLUCOTROL  Take 1 tablet by mouth daily.     insulin glargine 100 UNIT/ML injection  Commonly  known as:  LANTUS  Inject 20 Units into the skin daily.     mirabegron ER 25 MG Tb24 tablet  Commonly known as:  MYRBETRIQ  Take 1 tablet (25 mg total) by mouth daily.     MONOJECT INSULIN SYRINGE 31G X 5/16" 0.5 ML Misc  Generic drug:  Insulin Syringe-Needle U-100     omeprazole 20 MG capsule  Commonly known as:  PRILOSEC  Take 2 capsules by mouth daily.     polyethylene glycol powder powder  Commonly known as:  GLYCOLAX/MIRALAX  255 grams one bottle for colonoscopy prep     PROAIR HFA 108 (90 Base) MCG/ACT inhaler  Generic drug:  albuterol  Inhale 2 puffs into the lungs every 8 (eight) hours.     QUEtiapine 100 MG tablet  Commonly known as:  SEROQUEL  Take 1 tablet (100 mg total) by mouth daily.     solifenacin 10 MG tablet  Commonly known as:  VESICARE  Take 1 tablet (10 mg total) by mouth daily.        Allergies:  Allergies  Allergen Reactions  . Pravastatin Itching    Per patient "itching on the bottom of feet"    Family History: Family History  Problem Relation Age of Onset  . Colon cancer Father   . Colon polyps Brother   . Colon cancer Brother   . Colon polyps Daughter   . Diabetes Daughter   . Kidney disease Neg Hx   . Bladder Cancer Neg Hx   . Ovarian cancer Neg Hx   . Diabetes Mother   . Alcohol abuse Mother   . Heart disease Sister   . Breast cancer Sister   . Alcohol abuse Brother   . Drug abuse Brother   . Alcohol abuse Brother   . Drug abuse Brother   . Alcohol abuse Brother   . Drug abuse Brother   . Alcohol abuse Brother   . Drug abuse Brother   . Alcohol abuse Brother   . Drug abuse Brother   . Alcohol abuse Brother   . Drug abuse Brother     Social History:  reports that she has been smoking Cigarettes.  She has a 10 pack-year smoking history. She has never used smokeless tobacco. She reports  that she does not drink alcohol or use illicit drugs.  ROS: UROLOGY Frequent Urination?: Yes Hard to postpone urination?:  Yes Burning/pain with urination?: Yes Get up at night to urinate?: Yes Leakage of urine?: Yes Urine stream starts and stops?: No Trouble starting stream?: No Do you have to strain to urinate?: No Blood in urine?: No Urinary tract infection?: Yes Sexually transmitted disease?: No Injury to kidneys or bladder?: No Painful intercourse?: No Weak stream?: No Currently pregnant?: No Vaginal bleeding?: No Last menstrual period?: n  Gastrointestinal Nausea?: No Vomiting?: No Indigestion/heartburn?: Yes Diarrhea?: No Constipation?: No  Constitutional Fever: No Night sweats?: No Weight loss?: No Fatigue?: No  Skin Skin rash/lesions?: No Itching?: No  Eyes Blurred vision?: No Double vision?: No  Ears/Nose/Throat Sore throat?: No Sinus problems?: No  Hematologic/Lymphatic Swollen glands?: No Easy bruising?: No  Cardiovascular Leg swelling?: No Chest pain?: No  Respiratory Cough?: No Shortness of breath?: No  Endocrine Excessive thirst?: No  Musculoskeletal Back pain?: Yes Joint pain?: No  Neurological Headaches?: No Dizziness?: No  Psychologic Depression?: Yes Anxiety?: Yes  Physical Exam: BP 146/78 mmHg  Pulse 69  Ht 5\' 1"  (1.549 m)  Wt 233 lb 3.2 oz (105.779 kg)  BMI 44.09 kg/m2  Constitutional: Well nourished. Alert and oriented, No acute distress. HEENT: [place] AT, moist mucus membranes. Trachea midline, no masses. Cardiovascular: No clubbing, cyanosis, or edema. Respiratory: Normal respiratory effort, no increased work of breathing. Skin: No rashes, bruises or suspicious lesions. Lymph: No cervical or inguinal adenopathy. Neurologic: Grossly intact, no focal deficits, moving all 4 extremities. Psychiatric: Normal mood and affect.  Laboratory Data: Lab Results  Component Value Date   WBC 12.9* 10/02/2013   HGB 12.0 10/02/2013   HCT 37.2 10/02/2013   MCV 87 10/02/2013   PLT 267 10/02/2013    Lab Results  Component Value Date    CREATININE 2.83* 10/02/2013      Urinalysis Results for orders placed or performed in visit on 07/03/15  CULTURE, URINE COMPREHENSIVE  Result Value Ref Range   Urine Culture, Comprehensive Final report (A)    Result 1 Escherichia coli (A)    ANTIMICROBIAL SUSCEPTIBILITY Comment   Microscopic Examination  Result Value Ref Range   WBC, UA >30 (A) 0 -  5 /hpf   RBC, UA >30 (A) 0 -  2 /hpf   Epithelial Cells (non renal) None seen 0 - 10 /hpf   Bacteria, UA Few None seen/Few  Urinalysis, Complete  Result Value Ref Range   Specific Gravity, UA 1.015 1.005 - 1.030   pH, UA 7.0 5.0 - 7.5   Color, UA Straw Yellow   Appearance Ur Cloudy (A) Clear   Leukocytes, UA 3+ (A) Negative   Protein, UA 1+ (A) Negative/Trace   Glucose, UA Negative Negative   Ketones, UA Negative Negative   RBC, UA 2+ (A) Negative   Bilirubin, UA Negative Negative   Urobilinogen, Ur 0.2 0.2 - 1.0 mg/dL   Nitrite, UA Positive (A) Negative   Microscopic Examination See below:   BLADDER SCAN AMB NON-IMAGING  Result Value Ref Range   Scan Result 17     Pertinent Imaging: Results for CISSY, SASSEEN (MRN [id number]) as of 07/03/2015 10:37  Ref. Range 07/03/2015 10:27  Scan Result Unknown 17    Assessment & Plan:    1. Recurrent UTI:   Patient with nitrate positive UA.  I will send for culture.  I will wait until urine cultures available to prescribe an antibiotic.  Follow-up will be pending urine culture results.  - Urinalysis, Complete - BLADDER SCAN AMB NON-IMAGING - CULTURE, URINE COMPREHENSIVE  2. Mixed urinary incontinence:   Patient will continue the Myrbetriq 25 mg daily. A prescription sent to her pharmacy. She will need follow-up on a yearly basis for maintenance of this medication. She will return in 1 year for PVR and symptom recheck.   Return for pending urine culture results.  These notes generated with voice recognition software. I apologize for typographical errors.  Zara Council,  [facility] [address] [place], [place] [address] [phone]

## 2015-07-06 ENCOUNTER — Telehealth: Payer: Self-pay

## 2015-07-06 DIAGNOSIS — N390 Urinary tract infection, site not specified: Secondary | ICD-10-CM

## 2015-07-06 DIAGNOSIS — N3946 Mixed incontinence: Secondary | ICD-10-CM | POA: Insufficient documentation

## 2015-07-06 LAB — CULTURE, URINE COMPREHENSIVE

## 2015-07-06 MED ORDER — SULFAMETHOXAZOLE-TRIMETHOPRIM 800-160 MG PO TABS: 1.0000 | ORAL_TABLET | Freq: Two times a day (BID) | ORAL | Status: DC

## 2015-07-06 NOTE — Telephone Encounter (Signed)
-----   Message from Nori Riis, PA-C sent at 07/06/2015 12:38 PM EDT ----- Patient has a +UCx.  They need to start Septra DS, one tablet twice daily for seven days and then we need to check a CATH specimen in 3 to 5 days after they complete their antibiotics.

## 2015-07-06 NOTE — Telephone Encounter (Signed)
Spoke with pt in reference to +ucx. Made aware abx were sent to pharmacy. Pt voiced understanding.  

## 2015-07-12 ENCOUNTER — Ambulatory Visit (INDEPENDENT_AMBULATORY_CARE_PROVIDER_SITE_OTHER): Payer: Medicare Other | Admitting: Psychiatry

## 2015-07-12 ENCOUNTER — Encounter: Payer: Self-pay | Admitting: Psychiatry

## 2015-07-12 VITALS — BP 138/84 | HR 74 | Temp 98.5°F | Ht 61.0 in | Wt 233.0 lb

## 2015-07-12 DIAGNOSIS — F331 Major depressive disorder, recurrent, moderate: Secondary | ICD-10-CM | POA: Diagnosis not present

## 2015-07-12 DIAGNOSIS — F203 Undifferentiated schizophrenia: Secondary | ICD-10-CM

## 2015-07-12 MED ORDER — CITALOPRAM HYDROBROMIDE 40 MG PO TABS: 40.0000 mg | ORAL_TABLET | Freq: Every day | ORAL | Status: AC

## 2015-07-12 MED ORDER — QUETIAPINE FUMARATE 100 MG PO TABS: 100.0000 mg | ORAL_TABLET | Freq: Every day | ORAL | Status: DC

## 2015-07-12 MED ORDER — CLONAZEPAM 0.5 MG PO TABS: 0.2500 mg | ORAL_TABLET | Freq: Two times a day (BID) | ORAL | Status: DC
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
## 2015-08-10 ENCOUNTER — Ambulatory Visit: Payer: Medicare Other | Admitting: Psychiatry

## 2016-01-08 ENCOUNTER — Other Ambulatory Visit: Payer: Self-pay | Admitting: Family Medicine

## 2016-01-08 DIAGNOSIS — Z1231 Encounter for screening mammogram for malignant neoplasm of breast: Secondary | ICD-10-CM

## 2016-02-03 IMAGING — CR DG CHEST 1V PORT
1 series · 1 of 1 positions shown · non-contrast
Comparison: 10/03/2013

CLINICAL DATA: Fell getting out of bed

EXAM:
PORTABLE CHEST - 1 VIEW

[ap]
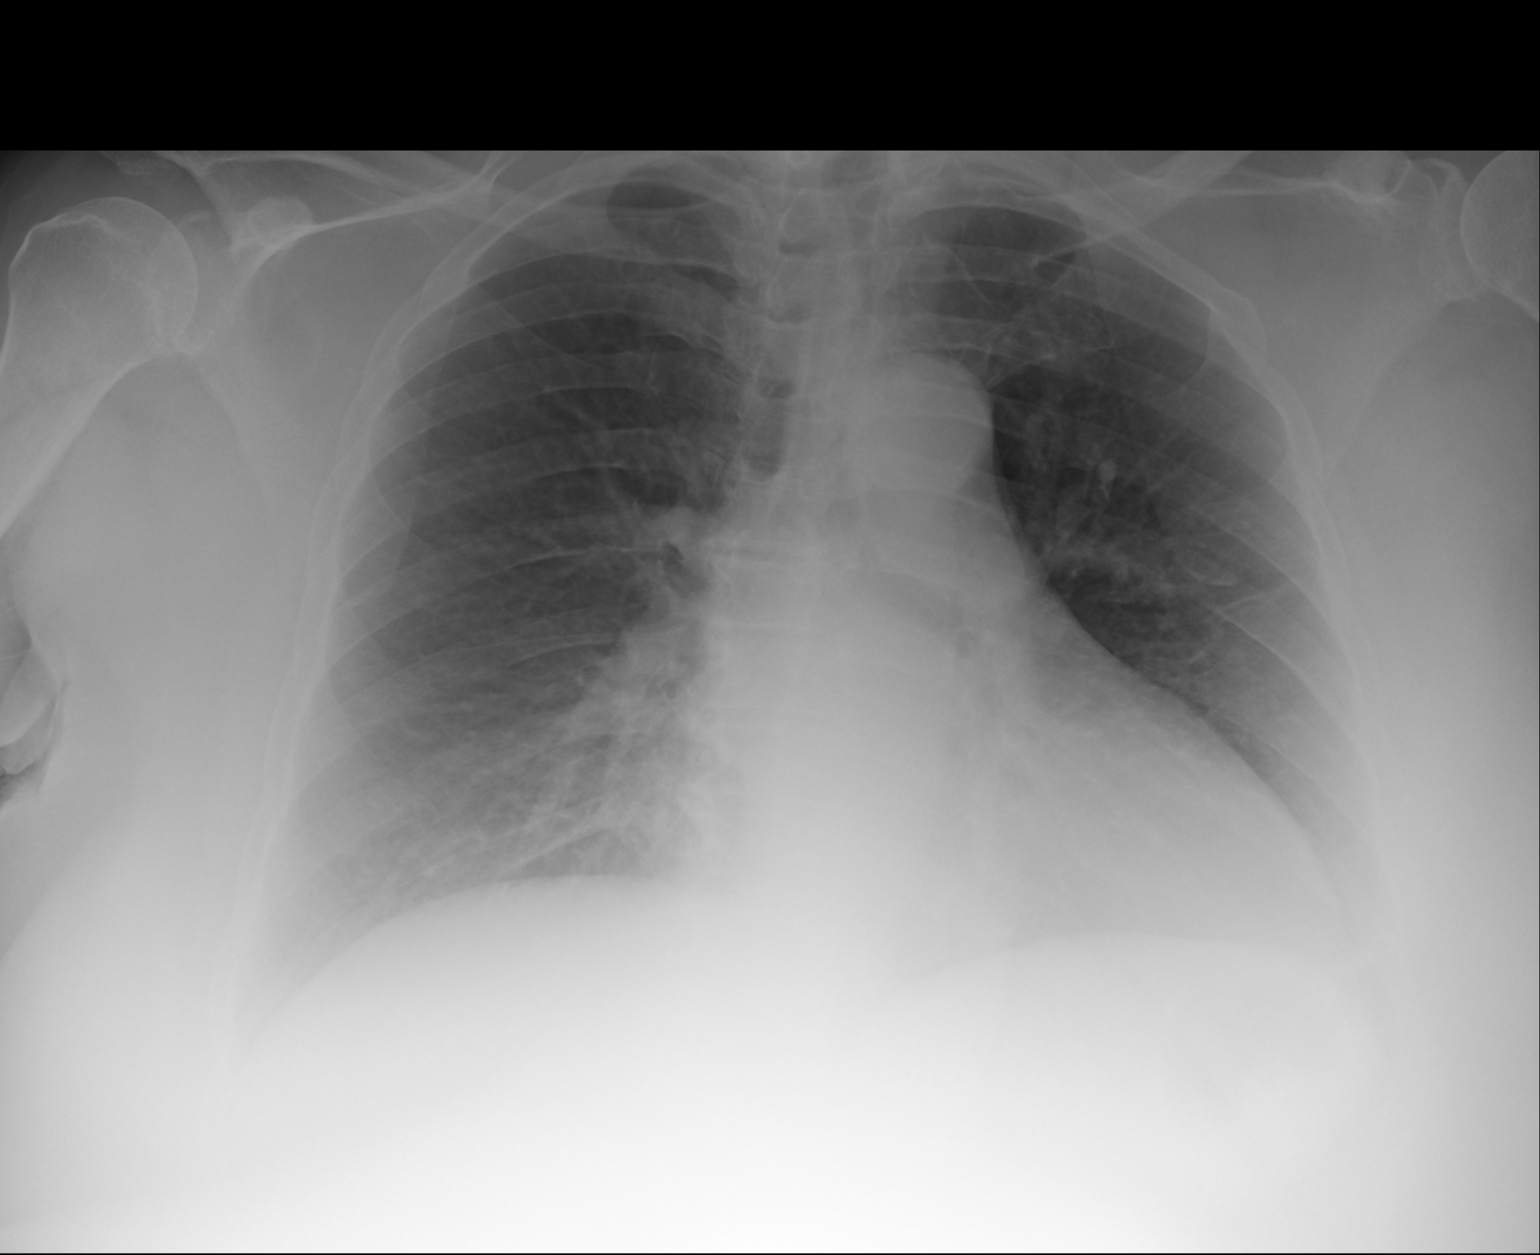

[1 of 1 positions shown; findings below may reference images not displayed]

FINDINGS: A single AP portable view of the chest demonstrates no focal
airspace consolidation or alveolar edema. The lungs are grossly
clear. There is no large effusion or pneumothorax. Cardiac and
mediastinal contours appear unremarkable.
IMPRESSION: No active disease.

## 2016-02-16 ENCOUNTER — Encounter: Payer: Self-pay | Admitting: Emergency Medicine

## 2016-02-16 ENCOUNTER — Emergency Department
Admission: EM | Admit: 2016-02-16 | Discharge: 2016-02-16 | Disposition: A | Discharge status: Home / Self Care | Payer: Medicare Other | Attending: Emergency Medicine | Admitting: Emergency Medicine

## 2016-02-16 ENCOUNTER — Emergency Department: Payer: Medicare Other

## 2016-02-16 DIAGNOSIS — I130 Hypertensive heart and chronic kidney disease with heart failure and stage 1 through stage 4 chronic kidney disease, or unspecified chronic kidney disease: Secondary | ICD-10-CM | POA: Diagnosis not present

## 2016-02-16 DIAGNOSIS — R51 Headache: Secondary | ICD-10-CM | POA: Diagnosis present

## 2016-02-16 DIAGNOSIS — Z79899 Other long term (current) drug therapy: Secondary | ICD-10-CM | POA: Diagnosis not present

## 2016-02-16 DIAGNOSIS — F17210 Nicotine dependence, cigarettes, uncomplicated: Secondary | ICD-10-CM | POA: Diagnosis not present

## 2016-02-16 DIAGNOSIS — G43909 Migraine, unspecified, not intractable, without status migrainosus: Secondary | ICD-10-CM | POA: Diagnosis not present

## 2016-02-16 DIAGNOSIS — E1122 Type 2 diabetes mellitus with diabetic chronic kidney disease: Secondary | ICD-10-CM | POA: Insufficient documentation

## 2016-02-16 DIAGNOSIS — Z7982 Long term (current) use of aspirin: Secondary | ICD-10-CM | POA: Diagnosis not present

## 2016-02-16 DIAGNOSIS — I509 Heart failure, unspecified: Secondary | ICD-10-CM | POA: Insufficient documentation

## 2016-02-16 DIAGNOSIS — Z794 Long term (current) use of insulin: Secondary | ICD-10-CM | POA: Insufficient documentation

## 2016-02-16 DIAGNOSIS — N183 Chronic kidney disease, stage 3 (moderate): Secondary | ICD-10-CM | POA: Insufficient documentation

## 2016-02-16 DIAGNOSIS — R519 Headache, unspecified: Secondary | ICD-10-CM

## 2016-02-16 MED ORDER — SODIUM CHLORIDE 0.9 % IV BOLUS (SEPSIS)
1000.0000 mL | Freq: Once | INTRAVENOUS | Status: AC
  Administered 2016-02-16: 1000 mL via INTRAVENOUS

## 2016-02-16 MED ORDER — KETOROLAC TROMETHAMINE 30 MG/ML IJ SOLN
30.0000 mg | Freq: Once | INTRAMUSCULAR | Status: AC
  Administered 2016-02-16: 30 mg via INTRAVENOUS
  Filled 2016-02-16: qty 1

## 2016-02-16 MED ORDER — DIPHENHYDRAMINE HCL 50 MG/ML IJ SOLN
25.0000 mg | Freq: Once | INTRAMUSCULAR | Status: AC
  Administered 2016-02-16: 25 mg via INTRAVENOUS
  Filled 2016-02-16: qty 1

## 2016-02-16 MED ORDER — BUTALBITAL-APAP-CAFFEINE 50-325-40 MG PO TABS: 1.0000 | ORAL_TABLET | Freq: Four times a day (QID) | ORAL | 0 refills | Status: DC | PRN

## 2016-02-16 MED ORDER — METOCLOPRAMIDE HCL 5 MG/ML IJ SOLN
10.0000 mg | Freq: Once | INTRAMUSCULAR | Status: AC
  Administered 2016-02-16: 10 mg via INTRAVENOUS
  Filled 2016-02-16: qty 2

## 2016-02-16 NOTE — ED Notes (Addendum)
Pt states R sided HA x 3 weeks. No hx of migraines, hx of stroke. Pt states she was taken off coumadin about 8 months ago. Denies blurred vision, double vision, movement/sounds/lights bothering her.

## 2016-02-16 NOTE — ED Provider Notes (Signed)
[facility] Emergency Department Provider Note  Time seen: 9:34 PM  I have reviewed the triage vital signs and the nursing notes.   HISTORY  Chief Complaint Headache    HPI Kathryn Kramer is a 65 y.o. female with a past medical history of CVA with mild right-sided deficits since 2001 who presents to the emergency department for right-sided headache. According to the patient over the past 3 weeks she has had a fairly constant right sided headache. States it fluctuates from mild to severe. Sates he was about an 8 or 9 out of 10 today she took Tylenol at home, currently it is a 7/10. Denies nausea. Denies phono or photophobia. Patient states a history of CVA in the past, she was on Coumadin until approximately one year ago and her doctor discontinued this medication. Patient states her main concern is that she may have had a clot or new stroke. Denies any new neurologic deficit.  Past Medical History:  Diagnosis Date  . Acid reflux   . Anxiety   . Arrhythmia   . BP (high blood pressure) 01/25/2013  . CCF (congestive cardiac failure) ([place]) 08/04/2013  . CHF (congestive heart failure) ([place])   . Chronic kidney disease   . Constipation   . Cystocele, grade 2   . Diabetes mellitus type I ([place])   . Diabetes mellitus without complication ([place])   . Female incontinence   . Hypertension   . Recurrent UTI   . Stroke [facility]) 2001    Patient Active Problem List   Diagnosis Date Noted  . Urinary incontinence, mixed 07/06/2015  . Morbid (severe) obesity due to excess calories ([place]) 01/19/2015  . Morbid obesity ([place]) 12/07/2014  . Recurrent UTI 10/29/2014  . Extreme obesity ([place]) 08/09/2014  . Cystocele, grade 2 08/09/2014  . Atrophic vaginitis 08/09/2014  . Incontinence 08/09/2014  . Compulsive tobacco user syndrome 05/09/2014  . Current tobacco use 05/09/2014  . History of colonic polyps 02/21/2014  . CCF (congestive cardiac failure) ([place]) 08/04/2013  . Chronic  kidney disease (CKD), stage III (moderate) 01/25/2013  . Diabetes ([place]) 01/25/2013  . BP (high blood pressure) 01/25/2013  . Angiopathy, peripheral ([place]) 01/25/2013  . Peripheral arterial occlusive disease ([place]) 01/25/2013  . Encounter for screening colonoscopy 12/23/2012  . Rectal bleeding 12/23/2012    Past Surgical History:  Procedure Laterality Date  . COLONOSCOPY  02/16/13  . elbow     pinch nerve  . HAND SURGERY Bilateral [id number]   carpel tunnel  . HIP SURGERY Right 1999  . MULTIPLE TOOTH EXTRACTIONS  2014  . TUBAL LIGATION  1985    Prior to Admission medications   Medication Sig Start Date End Date Taking? Authorizing Provider  amLODipine (NORVASC) 10 MG tablet Take 1 tablet by mouth daily. 12/17/12   Historical Provider, MD  aspirin 81 MG tablet Take 81 mg by mouth daily.    Historical Provider, MD  atorvastatin (LIPITOR) 20 MG tablet Take 1 tablet by mouth daily. 12/18/12   Historical Provider, MD  baclofen (LIORESAL) 10 MG tablet Take 1 tablet by mouth 3 (three) times daily. 12/18/12   Historical Provider, MD  benazepril (LOTENSIN) 20 MG tablet Take 1 tablet by mouth daily. 12/18/12   Historical Provider, MD  Calcium Carbonate-Vitamin D (CALCIUM + D PO) Take 2 tablets by mouth daily.    Historical Provider, MD  carvedilol (COREG) 25 MG tablet Reported on 05/29/2015 05/25/15   Historical Provider, MD  citalopram (CELEXA) 40 MG tablet  Take 1 tablet (40 mg total) by mouth daily. 07/12/15   Rainey Pines, MD  clonazePAM (KLONOPIN) 0.5 MG tablet Take 0.5 tablets (0.25 mg total) by mouth 2 (two) times daily. 07/12/15   Rainey Pines, MD  cloNIDine (CATAPRES - DOSED IN MG/24 HR) 0.3 mg/24hr patch  05/25/15   Historical Provider, MD  estradiol (ESTRACE) 0.1 MG/GM vaginal cream Place 1 Applicatorful vaginally at bedtime. Patient was given a sample of vaginal estrogen cream and instructed to apply 0.5mg  (pea-sized amount)  just inside the vaginal introitus with a finger-tip every night for  two weeks and then Monday, Wednesday and Friday nights. 08/09/14   Nori Riis, PA-C  estrogens, conjugated, (PREMARIN) 0.625 MG tablet Take 0.625 mg by mouth. Reported on 07/03/2015    Historical Provider, MD  furosemide (LASIX) 20 MG tablet Take 1 tablet by mouth 2 (two) times daily.  12/17/12   Historical Provider, MD  glipiZIDE (GLUCOTROL) 5 MG tablet Take 1 tablet by mouth daily. 12/18/12   Historical Provider, MD  insulin glargine (LANTUS) 100 UNIT/ML injection Inject 20 Units into the skin daily.    Historical Provider, MD  mirabegron ER (MYRBETRIQ) 25 MG TB24 tablet Take 1 tablet (25 mg total) by mouth daily. 07/03/15   Nori Riis, PA-C  MONOJECT INSULIN SYRINGE 31G X 5/16" 0.5 ML MISC  09/19/14   Historical Provider, MD  omeprazole (PRILOSEC) 20 MG capsule Take 2 capsules by mouth daily. 11/25/12   Historical Provider, MD  polyethylene glycol powder (GLYCOLAX/MIRALAX) powder 255 grams one bottle for colonoscopy prep 02/21/14   Robert Bellow, MD  PROAIR HFA 108 (90 BASE) MCG/ACT inhaler Inhale 2 puffs into the lungs every 8 (eight) hours. 10/22/12   Historical Provider, MD  QUEtiapine (SEROQUEL) 100 MG tablet Take 1 tablet (100 mg total) by mouth daily. 07/12/15   Rainey Pines, MD  solifenacin (VESICARE) 10 MG tablet Take 1 tablet (10 mg total) by mouth daily. 08/09/14   Nori Riis, PA-C    Allergies  Allergen Reactions  . Pravastatin Itching    Per patient "itching on the bottom of feet"    Family History  Problem Relation Age of Onset  . Colon cancer Father   . Colon polyps Brother   . Colon cancer Brother   . Diabetes Mother   . Alcohol abuse Mother   . Heart disease Sister   . Breast cancer Sister   . Alcohol abuse Brother   . Drug abuse Brother   . Alcohol abuse Brother   . Drug abuse Brother   . Alcohol abuse Brother   . Drug abuse Brother   . Alcohol abuse Brother   . Drug abuse Brother   . Alcohol abuse Brother   . Drug abuse Brother   . Alcohol abuse  Brother   . Drug abuse Brother   . Colon polyps Daughter   . Diabetes Daughter   . Kidney disease Neg Hx   . Bladder Cancer Neg Hx   . Ovarian cancer Neg Hx     Social History Social History  Substance Use Topics  . Smoking status: Current Every Day Smoker    Packs/day: 0.50    Years: 20.00    Types: Cigarettes  . Smokeless tobacco: Never Used  . Alcohol use No    Review of Systems Constitutional: Negative for fever Cardiovascular: Negative for chest pain. Respiratory: Negative for shortness of breath. Gastrointestinal: Negative for abdominal pain Musculoskeletal: Negative for back pain. Neurological: Moderate headache,  no weakness or numbness. 10-point ROS otherwise negative.  ____________________________________________   PHYSICAL EXAM:  VITAL SIGNS: ED Triage Vitals  Enc Vitals Group     BP 02/16/16 1904 (!) 185/83     Pulse Rate 02/16/16 1904 (!) 59     Resp 02/16/16 1904 18     Temp 02/16/16 1904 98.2 F (36.8 C)     Temp Source 02/16/16 1904 Oral     SpO2 02/16/16 1904 94 %     Weight 02/16/16 1905 228 lb (103.4 kg)     Height 02/16/16 1905 5\' 1"  (1.549 m)     Head Circumference --      Peak Flow --      Pain Score 02/16/16 1905 5     Pain Loc --      Pain Edu? --      Excl. in [place]? --     Constitutional: Alert and oriented. Well appearing and in no distress. Eyes: Normal exam ENT   Head: Normocephalic and atraumatic.   Mouth/Throat: Mucous membranes are moist. Cardiovascular: Normal rate, regular rhythm. No murmur Respiratory: Normal respiratory effort without tachypnea nor retractions. Breath sounds are clear Gastrointestinal: Soft and nontender. No distention.   Musculoskeletal: Nontender with normal range of motion in all extremities.  Neurologic:  Normal speech and language. No gross focal neurologic deficits. 5/5 motor in all extremities, equal grip strengths, no appreciable right-sided weakness. Skin:  Skin is warm, dry and intact.   Psychiatric: Mood and affect are normal.   ____________________________________________     RADIOLOGY  CT negative  ____________________________________________   INITIAL IMPRESSION / ASSESSMENT AND PLAN / ED COURSE  Pertinent labs & imaging results that were available during my care of the patient were reviewed by me and considered in my medical decision making (see chart for details).  Overall well-appearing patient with 3 weeks of right-sided headache. Patient denies any history of headaches or migraines in the past, states this is very unusual for her. Patient has been using over-the-counter Tylenol with mild relief but states the headache is always there. Given the patient's history we'll obtain a CT scan of the head to help evaluate. We will treat with Toradol, Reglan, Benadryl and IV fluids while awaiting CT imaging results.  CT scan negative. Patient states the headache is much improved but still present. We will discharge with Fioricet, I discussed return precautions. Patient will follow up with neurology.  ____________________________________________   FINAL CLINICAL IMPRESSION(S) / ED DIAGNOSES  Migraine headache    Harvest Dark, MD 02/16/16 2257

## 2016-02-16 NOTE — Discharge Instructions (Signed)
As we discussed please call the number provided for neurology to arrange a follow-up appointment. Return to the emergency department for any acute worsening of headache, any weakness or numbness of any arm or leg, any fever or any other symptom personally concerning to yourself.

## 2016-02-16 NOTE — ED Triage Notes (Signed)
Pt comes into the ED via POV c/o headache that has been ongoing for 3 weeks.  Denies any history of migraines.  Denies N/V, chest pain, shortness of breath, or weakness.  Patient presents in NAD at this time with even and unlabored respirations.  Denies photosensitivity or sound sensitivity.

## 2016-03-25 ENCOUNTER — Encounter: Payer: Self-pay | Admitting: Emergency Medicine

## 2016-03-25 ENCOUNTER — Emergency Department
Admission: EM | Admit: 2016-03-25 | Discharge: 2016-03-25 | Disposition: A | Discharge status: Home / Self Care | Payer: Medicare Other | Attending: Emergency Medicine | Admitting: Emergency Medicine

## 2016-03-25 DIAGNOSIS — I130 Hypertensive heart and chronic kidney disease with heart failure and stage 1 through stage 4 chronic kidney disease, or unspecified chronic kidney disease: Secondary | ICD-10-CM | POA: Diagnosis not present

## 2016-03-25 DIAGNOSIS — N3001 Acute cystitis with hematuria: Secondary | ICD-10-CM | POA: Insufficient documentation

## 2016-03-25 DIAGNOSIS — E1122 Type 2 diabetes mellitus with diabetic chronic kidney disease: Secondary | ICD-10-CM | POA: Diagnosis not present

## 2016-03-25 DIAGNOSIS — N183 Chronic kidney disease, stage 3 (moderate): Secondary | ICD-10-CM | POA: Insufficient documentation

## 2016-03-25 DIAGNOSIS — I509 Heart failure, unspecified: Secondary | ICD-10-CM | POA: Diagnosis not present

## 2016-03-25 DIAGNOSIS — Z79899 Other long term (current) drug therapy: Secondary | ICD-10-CM | POA: Diagnosis not present

## 2016-03-25 DIAGNOSIS — Z794 Long term (current) use of insulin: Secondary | ICD-10-CM | POA: Diagnosis not present

## 2016-03-25 DIAGNOSIS — R300 Dysuria: Secondary | ICD-10-CM | POA: Diagnosis present

## 2016-03-25 DIAGNOSIS — F17210 Nicotine dependence, cigarettes, uncomplicated: Secondary | ICD-10-CM | POA: Diagnosis not present

## 2016-03-25 DIAGNOSIS — Z7982 Long term (current) use of aspirin: Secondary | ICD-10-CM | POA: Insufficient documentation

## 2016-03-25 LAB — URINALYSIS, COMPLETE (UACMP) WITH MICROSCOPIC
Bilirubin Urine: NEGATIVE
GLUCOSE, UA: NEGATIVE mg/dL
KETONES UR: NEGATIVE mg/dL
Nitrite: POSITIVE — AB
PH: 5 (ref 5.0–8.0)
PROTEIN: NEGATIVE mg/dL
Specific Gravity, Urine: 1.013 (ref 1.005–1.030)

## 2016-03-25 MED ORDER — SULFAMETHOXAZOLE-TRIMETHOPRIM 400-80 MG PO TABS: 1.0000 | ORAL_TABLET | Freq: Two times a day (BID) | ORAL | 0 refills | Status: DC

## 2016-03-25 NOTE — ED Triage Notes (Signed)
C/O urinary frequency and burning x 5 days.

## 2016-03-25 NOTE — ED Provider Notes (Signed)
[facility] Emergency Department Provider Note  ____________________________________________  Time seen: Approximately 5:37 PM  I have reviewed the triage vital signs and the nursing notes.   HISTORY  Chief Complaint Dysuria    HPI Kathryn Kramer is a 66 y.o. female who presents to the emergency department for evaluation ofurinary frequency and burning for the past 5 days. She states that she gets urinary tract infections about every 3 months or so. She denies fever or back pain. She has not taken any medications for her current symptoms.  Past Medical History:  Diagnosis Date  . Acid reflux   . Anxiety   . Arrhythmia   . BP (high blood pressure) 01/25/2013  . CCF (congestive cardiac failure) ([place]) 08/04/2013  . CHF (congestive heart failure) ([place])   . Chronic kidney disease   . Constipation   . Cystocele, grade 2   . Diabetes mellitus type I ([place])   . Diabetes mellitus without complication ([place])   . Female incontinence   . Hypertension   . Recurrent UTI   . Stroke [facility]) 2001    Patient Active Problem List   Diagnosis Date Noted  . Urinary incontinence, mixed 07/06/2015  . Morbid (severe) obesity due to excess calories ([place]) 01/19/2015  . Morbid obesity ([place]) 12/07/2014  . Recurrent UTI 10/29/2014  . Extreme obesity ([place]) 08/09/2014  . Cystocele, grade 2 08/09/2014  . Atrophic vaginitis 08/09/2014  . Incontinence 08/09/2014  . Compulsive tobacco user syndrome 05/09/2014  . Current tobacco use 05/09/2014  . History of colonic polyps 02/21/2014  . CCF (congestive cardiac failure) ([place]) 08/04/2013  . Chronic kidney disease (CKD), stage III (moderate) 01/25/2013  . Diabetes ([place]) 01/25/2013  . BP (high blood pressure) 01/25/2013  . Angiopathy, peripheral ([place]) 01/25/2013  . Peripheral arterial occlusive disease ([place]) 01/25/2013  . Encounter for screening colonoscopy 12/23/2012  . Rectal bleeding 12/23/2012    Past Surgical History:   Procedure Laterality Date  . COLONOSCOPY  02/16/13  . elbow     pinch nerve  . HAND SURGERY Bilateral [id number]   carpel tunnel  . HIP SURGERY Right 1999  . MULTIPLE TOOTH EXTRACTIONS  2014  . TUBAL LIGATION  1985    Prior to Admission medications   Medication Sig Start Date End Date Taking? Authorizing Provider  amLODipine (NORVASC) 10 MG tablet Take 1 tablet by mouth daily. 12/17/12   Historical Provider, MD  aspirin 81 MG tablet Take 81 mg by mouth daily.    Historical Provider, MD  atorvastatin (LIPITOR) 20 MG tablet Take 1 tablet by mouth daily. 12/18/12   Historical Provider, MD  baclofen (LIORESAL) 10 MG tablet Take 1 tablet by mouth 3 (three) times daily. 12/18/12   Historical Provider, MD  benazepril (LOTENSIN) 20 MG tablet Take 1 tablet by mouth daily. 12/18/12   Historical Provider, MD  butalbital-acetaminophen-caffeine [name], ESGIC) [phone] MG tablet Take 1-2 tablets by mouth every 6 (six) hours as needed for headache. 02/16/16 02/15/17  Harvest Dark, MD  Calcium Carbonate-Vitamin D (CALCIUM + D PO) Take 2 tablets by mouth daily.    Historical Provider, MD  carvedilol (COREG) 25 MG tablet Reported on 05/29/2015 05/25/15   Historical Provider, MD  citalopram (CELEXA) 40 MG tablet Take 1 tablet (40 mg total) by mouth daily. 07/12/15   Rainey Pines, MD  clonazePAM (KLONOPIN) 0.5 MG tablet Take 0.5 tablets (0.25 mg total) by mouth 2 (two) times daily. 07/12/15   Rainey Pines, MD  cloNIDine (CATAPRES - DOSED  IN MG/24 HR) 0.3 mg/24hr patch  05/25/15   Historical Provider, MD  estradiol (ESTRACE) 0.1 MG/GM vaginal cream Place 1 Applicatorful vaginally at bedtime. Patient was given a sample of vaginal estrogen cream and instructed to apply 0.5mg  (pea-sized amount)  just inside the vaginal introitus with a finger-tip every night for two weeks and then Monday, Wednesday and Friday nights. 08/09/14   Nori Riis, PA-C  estrogens, conjugated, (PREMARIN) 0.625 MG tablet Take 0.625  mg by mouth. Reported on 07/03/2015    Historical Provider, MD  furosemide (LASIX) 20 MG tablet Take 1 tablet by mouth 2 (two) times daily.  12/17/12   Historical Provider, MD  glipiZIDE (GLUCOTROL) 5 MG tablet Take 1 tablet by mouth daily. 12/18/12   Historical Provider, MD  insulin glargine (LANTUS) 100 UNIT/ML injection Inject 20 Units into the skin daily.    Historical Provider, MD  mirabegron ER (MYRBETRIQ) 25 MG TB24 tablet Take 1 tablet (25 mg total) by mouth daily. 07/03/15   Nori Riis, PA-C  MONOJECT INSULIN SYRINGE 31G X 5/16" 0.5 ML MISC  09/19/14   Historical Provider, MD  omeprazole (PRILOSEC) 20 MG capsule Take 2 capsules by mouth daily. 11/25/12   Historical Provider, MD  polyethylene glycol powder (GLYCOLAX/MIRALAX) powder 255 grams one bottle for colonoscopy prep 02/21/14   Robert Bellow, MD  PROAIR HFA 108 (90 BASE) MCG/ACT inhaler Inhale 2 puffs into the lungs every 8 (eight) hours. 10/22/12   Historical Provider, MD  QUEtiapine (SEROQUEL) 100 MG tablet Take 1 tablet (100 mg total) by mouth daily. 07/12/15   Rainey Pines, MD  solifenacin (VESICARE) 10 MG tablet Take 1 tablet (10 mg total) by mouth daily. 08/09/14   Nori Riis, PA-C  sulfamethoxazole-trimethoprim (BACTRIM) 400-80 MG tablet Take 1 tablet by mouth 2 (two) times daily. 03/25/16   Victorino Dike, FNP    Allergies Pravastatin  Family History  Problem Relation Age of Onset  . Colon cancer Father   . Colon polyps Brother   . Colon cancer Brother   . Diabetes Mother   . Alcohol abuse Mother   . Heart disease Sister   . Breast cancer Sister   . Alcohol abuse Brother   . Drug abuse Brother   . Alcohol abuse Brother   . Drug abuse Brother   . Alcohol abuse Brother   . Drug abuse Brother   . Alcohol abuse Brother   . Drug abuse Brother   . Alcohol abuse Brother   . Drug abuse Brother   . Alcohol abuse Brother   . Drug abuse Brother   . Colon polyps Daughter   . Diabetes Daughter   . Kidney  disease Neg Hx   . Bladder Cancer Neg Hx   . Ovarian cancer Neg Hx     Social History Social History  Substance Use Topics  . Smoking status: Current Every Day Smoker    Packs/day: 0.50    Years: 20.00    Types: Cigarettes  . Smokeless tobacco: Never Used  . Alcohol use No    Review of Systems Constitutional: Negative for fever. Respiratory: Negative for shortness of breath or cough. Gastrointestinal: Positive for abdominal pain; negative for nausea , negative for vomiting. Genitourinary: Positive for dysuria , negative for vaginal discharge. Musculoskeletal: Negative for back pain. Skin: Negative for rash, lesion, or wound. ____________________________________________   PHYSICAL EXAM:  VITAL SIGNS: ED Triage Vitals  Enc Vitals Group     BP 03/25/16 1718 (!) 150/70  Pulse Rate 03/25/16 1718 (!) 57     Resp 03/25/16 1716 16     Temp 03/25/16 1716 98.7 F (37.1 C)     Temp Source 03/25/16 1716 Oral     SpO2 03/25/16 1718 95 %     Weight 03/25/16 1717 225 lb (102.1 kg)     Height 03/25/16 1717 5\' 1"  (1.549 m)     Head Circumference --      Peak Flow --      Pain Score 03/25/16 1718 0     Pain Loc --      Pain Edu? --      Excl. in [place]? --     Constitutional: Alert and oriented. Well appearing and in no acute distress. Eyes: Conjunctivae are normal. PERRL. EOMI. Head: Atraumatic. Nose: No congestion/rhinnorhea. Mouth/Throat: Mucous membranes are moist. Respiratory: Normal respiratory effort.  No retractions. Gastrointestinal: Positive for suprapubic tenderness. Genitourinary: Pelvic exam: not indicated. Musculoskeletal: No extremity tenderness nor edema.  Neurologic:  Normal speech and language. No gross focal neurologic deficits are appreciated. Speech is normal. No gait instability. Skin:  Skin is warm, dry and intact. No rash noted. Psychiatric: Mood and affect are normal. Speech and behavior are normal.  ____________________________________________    LABS (all labs ordered are listed, but only abnormal results are displayed)  Labs Reviewed  URINALYSIS, COMPLETE (UACMP) WITH MICROSCOPIC - Abnormal; Notable for the following:       Result Value   Color, Urine YELLOW (*)    APPearance CLOUDY (*)    Hgb urine dipstick SMALL (*)    Nitrite POSITIVE (*)    Leukocytes, UA LARGE (*)    Bacteria, UA RARE (*)    Squamous Epithelial / LPF 0-5 (*)    All other components within normal limits   ____________________________________________  RADIOLOGY  None ____________________________________________   PROCEDURES  Procedure(s) performed: None  ____________________________________________   INITIAL IMPRESSION / ASSESSMENT AND PLAN / ED COURSE      Pertinent labs & imaging results that were available during my care of the patient were reviewed by me and considered in my medical decision making (see chart for details).  66 year old female presenting to the emergency department for 5 days of dysuria and urinary frequency. Urinalysis and exam consistent with acute cystitis. She'll be treated with Bactrim for the next 5 days. Her dose was decreased by half, but the number of days extended due to renal failure. She is not a dialysis patient at this time. She was encouraged to follow up with her primary care provider in 10 days for a repeat urinalysis to ensure that the infection is cleared. She was also advised to return to the emergency department for symptoms that change or worsen if she is unable to schedule an appointment. ____________________________________________   FINAL CLINICAL IMPRESSION(S) / ED DIAGNOSES  Final diagnoses:  Acute cystitis with hematuria    Note:  This document was prepared using Dragon voice recognition software and may include unintentional dictation errors.    Victorino Dike, FNP 03/25/16 [place], MD 03/27/16 [phone]

## 2016-05-05 ENCOUNTER — Emergency Department
Admission: EM | Admit: 2016-05-05 | Discharge: 2016-05-05 | Disposition: A | Discharge status: Home / Self Care | Payer: Medicare Other | Attending: Emergency Medicine | Admitting: Emergency Medicine

## 2016-05-05 ENCOUNTER — Encounter: Payer: Self-pay | Admitting: Emergency Medicine

## 2016-05-05 DIAGNOSIS — Z7982 Long term (current) use of aspirin: Secondary | ICD-10-CM | POA: Insufficient documentation

## 2016-05-05 DIAGNOSIS — F17210 Nicotine dependence, cigarettes, uncomplicated: Secondary | ICD-10-CM | POA: Insufficient documentation

## 2016-05-05 DIAGNOSIS — I130 Hypertensive heart and chronic kidney disease with heart failure and stage 1 through stage 4 chronic kidney disease, or unspecified chronic kidney disease: Secondary | ICD-10-CM | POA: Diagnosis not present

## 2016-05-05 DIAGNOSIS — N3001 Acute cystitis with hematuria: Secondary | ICD-10-CM | POA: Insufficient documentation

## 2016-05-05 DIAGNOSIS — Z794 Long term (current) use of insulin: Secondary | ICD-10-CM | POA: Diagnosis not present

## 2016-05-05 DIAGNOSIS — N189 Chronic kidney disease, unspecified: Secondary | ICD-10-CM | POA: Insufficient documentation

## 2016-05-05 DIAGNOSIS — E1122 Type 2 diabetes mellitus with diabetic chronic kidney disease: Secondary | ICD-10-CM | POA: Diagnosis not present

## 2016-05-05 DIAGNOSIS — Z79899 Other long term (current) drug therapy: Secondary | ICD-10-CM | POA: Insufficient documentation

## 2016-05-05 DIAGNOSIS — I509 Heart failure, unspecified: Secondary | ICD-10-CM | POA: Insufficient documentation

## 2016-05-05 DIAGNOSIS — R300 Dysuria: Secondary | ICD-10-CM | POA: Diagnosis present

## 2016-05-05 LAB — URINALYSIS, COMPLETE (UACMP) WITH MICROSCOPIC
BILIRUBIN URINE: NEGATIVE
Bacteria, UA: NONE SEEN
Glucose, UA: NEGATIVE mg/dL
Ketones, ur: NEGATIVE mg/dL
NITRITE: NEGATIVE
PH: 5 (ref 5.0–8.0)
Protein, ur: 100 mg/dL — AB
SPECIFIC GRAVITY, URINE: 1.011 (ref 1.005–1.030)

## 2016-05-05 MED ORDER — SULFAMETHOXAZOLE-TRIMETHOPRIM 800-160 MG PO TABS: 1.0000 | ORAL_TABLET | Freq: Two times a day (BID) | ORAL | 0 refills | Status: AC

## 2016-05-05 NOTE — ED Provider Notes (Signed)
[facility] Emergency Department Provider Note  ____________________________________________  Time seen: Approximately 4:07 PM  I have reviewed the triage vital signs and the nursing notes.   HISTORY  Chief Complaint Urinary Tract Infection    HPI Kathryn Kramer is a 66 y.o. female presents to the emergency department with dysuria, urgency, frequency since 12:30 today. Patient states that she gets frequent UTIs. Patient saw the urologist last month who told her that she gets repeated UTIs because her bladder does not empty all the way. Patient had a UTI in January that was treated with Bactrim and resolved. Patient denies fever, headache, shortness of breath, chest pain, nausea, vomiting, abdominal pain, back pain, vaginal discharge.    Past Medical History:  Diagnosis Date  . Acid reflux   . Anxiety   . Arrhythmia   . BP (high blood pressure) 01/25/2013  . CCF (congestive cardiac failure) ([place]) 08/04/2013  . CHF (congestive heart failure) ([place])   . Chronic kidney disease   . Constipation   . Cystocele, grade 2   . Diabetes mellitus type I ([place])   . Diabetes mellitus without complication ([place])   . Female incontinence   . Hypertension   . Recurrent UTI   . Stroke [facility]) 2001    Patient Active Problem List   Diagnosis Date Noted  . Urinary incontinence, mixed 07/06/2015  . Morbid (severe) obesity due to excess calories ([place]) 01/19/2015  . Morbid obesity ([place]) 12/07/2014  . Recurrent UTI 10/29/2014  . Extreme obesity ([place]) 08/09/2014  . Cystocele, grade 2 08/09/2014  . Atrophic vaginitis 08/09/2014  . Incontinence 08/09/2014  . Compulsive tobacco user syndrome 05/09/2014  . Current tobacco use 05/09/2014  . History of colonic polyps 02/21/2014  . CCF (congestive cardiac failure) ([place]) 08/04/2013  . Chronic kidney disease (CKD), stage III (moderate) 01/25/2013  . Diabetes ([place]) 01/25/2013  . BP (high blood pressure) 01/25/2013  . Angiopathy,  peripheral ([place]) 01/25/2013  . Peripheral arterial occlusive disease ([place]) 01/25/2013  . Encounter for screening colonoscopy 12/23/2012  . Rectal bleeding 12/23/2012    Past Surgical History:  Procedure Laterality Date  . COLONOSCOPY  02/16/13  . elbow     pinch nerve  . HAND SURGERY Bilateral [id number]   carpel tunnel  . HIP SURGERY Right 1999  . MULTIPLE TOOTH EXTRACTIONS  2014  . TUBAL LIGATION  1985    Prior to Admission medications   Medication Sig Start Date End Date Taking? Authorizing Provider  amLODipine (NORVASC) 10 MG tablet Take 1 tablet by mouth daily. 12/17/12   Historical Provider, MD  aspirin 81 MG tablet Take 81 mg by mouth daily.    Historical Provider, MD  atorvastatin (LIPITOR) 20 MG tablet Take 1 tablet by mouth daily. 12/18/12   Historical Provider, MD  baclofen (LIORESAL) 10 MG tablet Take 1 tablet by mouth 3 (three) times daily. 12/18/12   Historical Provider, MD  benazepril (LOTENSIN) 20 MG tablet Take 1 tablet by mouth daily. 12/18/12   Historical Provider, MD  butalbital-acetaminophen-caffeine [name], ESGIC) [phone] MG tablet Take 1-2 tablets by mouth every 6 (six) hours as needed for headache. 02/16/16 02/15/17  Harvest Dark, MD  Calcium Carbonate-Vitamin D (CALCIUM + D PO) Take 2 tablets by mouth daily.    Historical Provider, MD  carvedilol (COREG) 25 MG tablet Reported on 05/29/2015 05/25/15   Historical Provider, MD  citalopram (CELEXA) 40 MG tablet Take 1 tablet (40 mg total) by mouth daily. 07/12/15   Rainey Pines, MD  clonazePAM (KLONOPIN) 0.5 MG tablet Take 0.5 tablets (0.25 mg total) by mouth 2 (two) times daily. 07/12/15   Rainey Pines, MD  cloNIDine (CATAPRES - DOSED IN MG/24 HR) 0.3 mg/24hr patch  05/25/15   Historical Provider, MD  estradiol (ESTRACE) 0.1 MG/GM vaginal cream Place 1 Applicatorful vaginally at bedtime. Patient was given a sample of vaginal estrogen cream and instructed to apply 0.5mg  (pea-sized amount)  just inside the vaginal  introitus with a finger-tip every night for two weeks and then Monday, Wednesday and Friday nights. 08/09/14   Nori Riis, PA-C  estrogens, conjugated, (PREMARIN) 0.625 MG tablet Take 0.625 mg by mouth. Reported on 07/03/2015    Historical Provider, MD  furosemide (LASIX) 20 MG tablet Take 1 tablet by mouth 2 (two) times daily.  12/17/12   Historical Provider, MD  glipiZIDE (GLUCOTROL) 5 MG tablet Take 1 tablet by mouth daily. 12/18/12   Historical Provider, MD  insulin glargine (LANTUS) 100 UNIT/ML injection Inject 20 Units into the skin daily.    Historical Provider, MD  mirabegron ER (MYRBETRIQ) 25 MG TB24 tablet Take 1 tablet (25 mg total) by mouth daily. 07/03/15   Nori Riis, PA-C  MONOJECT INSULIN SYRINGE 31G X 5/16" 0.5 ML MISC  09/19/14   Historical Provider, MD  omeprazole (PRILOSEC) 20 MG capsule Take 2 capsules by mouth daily. 11/25/12   Historical Provider, MD  polyethylene glycol powder (GLYCOLAX/MIRALAX) powder 255 grams one bottle for colonoscopy prep 02/21/14   Robert Bellow, MD  PROAIR HFA 108 (90 BASE) MCG/ACT inhaler Inhale 2 puffs into the lungs every 8 (eight) hours. 10/22/12   Historical Provider, MD  QUEtiapine (SEROQUEL) 100 MG tablet Take 1 tablet (100 mg total) by mouth daily. 07/12/15   Rainey Pines, MD  solifenacin (VESICARE) 10 MG tablet Take 1 tablet (10 mg total) by mouth daily. 08/09/14   Nori Riis, PA-C  sulfamethoxazole-trimethoprim (BACTRIM DS,SEPTRA DS) 800-160 MG tablet Take 1 tablet by mouth 2 (two) times daily. 05/05/16 05/12/16  Laban Emperor, PA-C    Allergies Pravastatin  Family History  Problem Relation Age of Onset  . Colon cancer Father   . Colon polyps Brother   . Colon cancer Brother   . Diabetes Mother   . Alcohol abuse Mother   . Heart disease Sister   . Breast cancer Sister   . Alcohol abuse Brother   . Drug abuse Brother   . Alcohol abuse Brother   . Drug abuse Brother   . Alcohol abuse Brother   . Drug abuse Brother   .  Alcohol abuse Brother   . Drug abuse Brother   . Alcohol abuse Brother   . Drug abuse Brother   . Alcohol abuse Brother   . Drug abuse Brother   . Colon polyps Daughter   . Diabetes Daughter   . Kidney disease Neg Hx   . Bladder Cancer Neg Hx   . Ovarian cancer Neg Hx     Social History Social History  Substance Use Topics  . Smoking status: Current Every Day Smoker    Packs/day: 0.50    Years: 20.00    Types: Cigarettes  . Smokeless tobacco: Never Used  . Alcohol use No     Review of Systems  Constitutional: No fever/chills ENT: No upper respiratory complaints. Cardiovascular: No chest pain. Respiratory: No cough. No SOB. Gastrointestinal: No abdominal pain.  No nausea, no vomiting.  Musculoskeletal: Negative for musculoskeletal pain. Skin: Negative for rash, abrasions, lacerations,  ecchymosis. Neurological: Negative for headaches, numbness or tingling   ____________________________________________   PHYSICAL EXAM:  VITAL SIGNS: ED Triage Vitals  Enc Vitals Group     BP 05/05/16 1539 124/69     Pulse Rate 05/05/16 1539 (!) 56     Resp 05/05/16 1539 16     Temp 05/05/16 1539 98.1 F (36.7 C)     Temp Source 05/05/16 1539 Oral     SpO2 05/05/16 1539 98 %     Weight 05/05/16 1539 225 lb (102.1 kg)     Height 05/05/16 1539 5\' 1"  (1.549 m)     Head Circumference --      Peak Flow --      Pain Score 05/05/16 1541 6     Pain Loc --      Pain Edu? --      Excl. in [place]? --      Constitutional: Alert and oriented. Well appearing and in no acute distress. Eyes: Conjunctivae are normal. PERRL. EOMI. Head: Atraumatic. ENT:      Ears:      Nose: No congestion/rhinnorhea.      Mouth/Throat: Mucous membranes are moist.  Neck: No stridor.  Cardiovascular: Normal rate, regular rhythm.  Good peripheral circulation. Respiratory: Normal respiratory effort without tachypnea or retractions. Lungs CTAB. Good air entry to the bases with no decreased or absent breath  sounds. Gastrointestinal: Bowel sounds 4 quadrants. Soft and nontender to palpation. No guarding or rigidity. No palpable masses. No distention. No CVA tenderness. Musculoskeletal: Full range of motion to all extremities. No gross deformities appreciated. Neurologic:  Normal speech and language. No gross focal neurologic deficits are appreciated.  Skin:  Skin is warm, dry and intact. No rash noted.  ____________________________________________   LABS (all labs ordered are listed, but only abnormal results are displayed)  Labs Reviewed  URINALYSIS, COMPLETE (UACMP) WITH MICROSCOPIC - Abnormal; Notable for the following:       Result Value   Color, Urine YELLOW (*)    APPearance TURBID (*)    Hgb urine dipstick LARGE (*)    Protein, ur 100 (*)    Leukocytes, UA LARGE (*)    Squamous Epithelial / LPF 0-5 (*)    All other components within normal limits  URINE CULTURE   ____________________________________________  EKG   ____________________________________________  RADIOLOGY   No results found.  ____________________________________________    PROCEDURES  Procedure(s) performed:    Procedures    Medications - No data to display   ____________________________________________   INITIAL IMPRESSION / ASSESSMENT AND PLAN / ED COURSE  Pertinent labs & imaging results that were available during my care of the patient were reviewed by me and considered in my medical decision making (see chart for details).  Review of the [place] CSRS was performed in accordance of the [place] prior to dispensing any controlled drugs.     Patient's diagnosis is consistent with urinary tract infection. Vital signs and exam are reassuring. Urinalysis and symptoms indicates infection. Patient will be discharged home with prescriptions for Bactrim. This has worked for her in the past. Patient is to follow up with PCP as directed. Patient is given ED precautions to return to the ED for any  worsening or new symptoms.     ____________________________________________  FINAL CLINICAL IMPRESSION(S) / ED DIAGNOSES  Final diagnoses:  Acute cystitis with hematuria      NEW MEDICATIONS STARTED DURING THIS VISIT:  New Prescriptions   SULFAMETHOXAZOLE-TRIMETHOPRIM (BACTRIM DS,SEPTRA DS) 800-160 MG TABLET  Take 1 tablet by mouth 2 (two) times daily.        This chart was dictated using voice recognition software/Dragon. Despite best efforts to proofread, errors can occur which can change the meaning. Any change was purely unintentional.    Laban Emperor, PA-C 05/05/16 1656    Carrie Mew, MD 05/10/16 1524

## 2016-05-05 NOTE — ED Triage Notes (Signed)
Pt presents to ED c/o dysuria with pelvic pain starting 1200 today. Pt states she has frequent UTIs.

## 2016-05-07 LAB — URINE CULTURE

## 2016-07-26 ENCOUNTER — Observation Stay
Admission: EM | Admit: 2016-07-26 | Discharge: 2016-07-27 | Disposition: A | Discharge status: Home / Self Care | Payer: Medicare Other | Attending: Internal Medicine | Admitting: Internal Medicine

## 2016-07-26 ENCOUNTER — Encounter: Payer: Self-pay | Admitting: Internal Medicine

## 2016-07-26 DIAGNOSIS — N183 Chronic kidney disease, stage 3 (moderate): Secondary | ICD-10-CM | POA: Insufficient documentation

## 2016-07-26 DIAGNOSIS — K219 Gastro-esophageal reflux disease without esophagitis: Secondary | ICD-10-CM | POA: Insufficient documentation

## 2016-07-26 DIAGNOSIS — Z86010 Personal history of colonic polyps: Secondary | ICD-10-CM | POA: Insufficient documentation

## 2016-07-26 DIAGNOSIS — Z8371 Family history of colonic polyps: Secondary | ICD-10-CM | POA: Diagnosis not present

## 2016-07-26 DIAGNOSIS — Z79899 Other long term (current) drug therapy: Secondary | ICD-10-CM | POA: Diagnosis not present

## 2016-07-26 DIAGNOSIS — Z800 Family history of malignant neoplasm of digestive organs: Secondary | ICD-10-CM | POA: Insufficient documentation

## 2016-07-26 DIAGNOSIS — E1022 Type 1 diabetes mellitus with diabetic chronic kidney disease: Secondary | ICD-10-CM | POA: Diagnosis not present

## 2016-07-26 DIAGNOSIS — F419 Anxiety disorder, unspecified: Secondary | ICD-10-CM | POA: Insufficient documentation

## 2016-07-26 DIAGNOSIS — Z8673 Personal history of transient ischemic attack (TIA), and cerebral infarction without residual deficits: Secondary | ICD-10-CM | POA: Insufficient documentation

## 2016-07-26 DIAGNOSIS — I130 Hypertensive heart and chronic kidney disease with heart failure and stage 1 through stage 4 chronic kidney disease, or unspecified chronic kidney disease: Secondary | ICD-10-CM | POA: Insufficient documentation

## 2016-07-26 DIAGNOSIS — T464X5A Adverse effect of angiotensin-converting-enzyme inhibitors, initial encounter: Secondary | ICD-10-CM | POA: Insufficient documentation

## 2016-07-26 DIAGNOSIS — Z6841 Body Mass Index (BMI) 40.0 and over, adult: Secondary | ICD-10-CM | POA: Diagnosis not present

## 2016-07-26 DIAGNOSIS — Z87440 Personal history of urinary (tract) infections: Secondary | ICD-10-CM | POA: Diagnosis not present

## 2016-07-26 DIAGNOSIS — E1051 Type 1 diabetes mellitus with diabetic peripheral angiopathy without gangrene: Secondary | ICD-10-CM | POA: Diagnosis not present

## 2016-07-26 DIAGNOSIS — F329 Major depressive disorder, single episode, unspecified: Secondary | ICD-10-CM | POA: Insufficient documentation

## 2016-07-26 DIAGNOSIS — Z7982 Long term (current) use of aspirin: Secondary | ICD-10-CM | POA: Diagnosis not present

## 2016-07-26 DIAGNOSIS — Z888 Allergy status to other drugs, medicaments and biological substances status: Secondary | ICD-10-CM | POA: Insufficient documentation

## 2016-07-26 DIAGNOSIS — T783XXA Angioneurotic edema, initial encounter: Principal | ICD-10-CM | POA: Diagnosis present

## 2016-07-26 DIAGNOSIS — Z811 Family history of alcohol abuse and dependence: Secondary | ICD-10-CM | POA: Insufficient documentation

## 2016-07-26 DIAGNOSIS — F17210 Nicotine dependence, cigarettes, uncomplicated: Secondary | ICD-10-CM | POA: Diagnosis not present

## 2016-07-26 DIAGNOSIS — N3946 Mixed incontinence: Secondary | ICD-10-CM | POA: Diagnosis not present

## 2016-07-26 DIAGNOSIS — I509 Heart failure, unspecified: Secondary | ICD-10-CM | POA: Diagnosis not present

## 2016-07-26 DIAGNOSIS — Z8249 Family history of ischemic heart disease and other diseases of the circulatory system: Secondary | ICD-10-CM | POA: Insufficient documentation

## 2016-07-26 DIAGNOSIS — Z794 Long term (current) use of insulin: Secondary | ICD-10-CM | POA: Insufficient documentation

## 2016-07-26 DIAGNOSIS — Y929 Unspecified place or not applicable: Secondary | ICD-10-CM | POA: Diagnosis not present

## 2016-07-26 DIAGNOSIS — Z803 Family history of malignant neoplasm of breast: Secondary | ICD-10-CM | POA: Insufficient documentation

## 2016-07-26 LAB — GLUCOSE, CAPILLARY
GLUCOSE-CAPILLARY: 221 mg/dL — AB (ref 65–99)
GLUCOSE-CAPILLARY: 246 mg/dL — AB (ref 65–99)
Glucose-Capillary: 175 mg/dL — ABNORMAL HIGH (ref 65–99)
Glucose-Capillary: 181 mg/dL — ABNORMAL HIGH (ref 65–99)

## 2016-07-26 LAB — CBC
HCT: 40.4 % (ref 35.0–47.0)
HEMOGLOBIN: 13.1 g/dL (ref 12.0–16.0)
MCH: 28.7 pg (ref 26.0–34.0)
MCHC: 32.5 g/dL (ref 32.0–36.0)
MCV: 88.5 fL (ref 80.0–100.0)
Platelets: 266 10*3/uL (ref 150–440)
RBC: 4.57 MIL/uL (ref 3.80–5.20)
RDW: 13.8 % (ref 11.5–14.5)
WBC: 12.1 10*3/uL — ABNORMAL HIGH (ref 3.6–11.0)

## 2016-07-26 LAB — BASIC METABOLIC PANEL
Anion gap: 8 (ref 5–15)
BUN: 26 mg/dL — ABNORMAL HIGH (ref 6–20)
CHLORIDE: 108 mmol/L (ref 101–111)
CO2: 22 mmol/L (ref 22–32)
CREATININE: 1.75 mg/dL — AB (ref 0.44–1.00)
Calcium: 9.2 mg/dL (ref 8.9–10.3)
GFR calc non Af Amer: 29 mL/min — ABNORMAL LOW (ref >60)
GFR, EST AFRICAN AMERICAN: 34 mL/min — AB (ref >60)
Glucose, Bld: 217 mg/dL — ABNORMAL HIGH (ref 65–99)
Potassium: 4.6 mmol/L (ref 3.5–5.1)
SODIUM: 138 mmol/L (ref 135–145)

## 2016-07-26 LAB — CREATININE, SERUM
CREATININE: 1.74 mg/dL — AB (ref 0.44–1.00)
GFR calc Af Amer: 34 mL/min — ABNORMAL LOW (ref >60)
GFR, EST NON AFRICAN AMERICAN: 30 mL/min — AB (ref >60)

## 2016-07-26 LAB — MRSA PCR SCREENING: MRSA by PCR: NEGATIVE

## 2016-07-26 LAB — ABO/RH: ABO/RH(D): O POS

## 2016-07-26 MED ORDER — CITALOPRAM HYDROBROMIDE 20 MG PO TABS
40.0000 mg | ORAL_TABLET | Freq: Every day | ORAL | Status: DC
  Administered 2016-07-27: 40 mg via ORAL
  Filled 2016-07-26: qty 2

## 2016-07-26 MED ORDER — BACLOFEN 10 MG PO TABS
10.0000 mg | ORAL_TABLET | Freq: Three times a day (TID) | ORAL | Status: DC
  Administered 2016-07-26 – 2016-07-27 (×2): 10 mg via ORAL
  Filled 2016-07-26 (×2): qty 1

## 2016-07-26 MED ORDER — EPINEPHRINE 0.3 MG/0.3ML IJ SOAJ
0.3000 mg | Freq: Once | INTRAMUSCULAR | Status: AC
  Administered 2016-07-26: 0.3 mg via INTRAMUSCULAR
  Filled 2016-07-26: qty 0.3

## 2016-07-26 MED ORDER — ACETAMINOPHEN 650 MG RE SUPP: 650.0000 mg | Freq: Four times a day (QID) | RECTAL | Status: DC | PRN

## 2016-07-26 MED ORDER — SODIUM CHLORIDE 0.9 % IV SOLN
INTRAVENOUS | Status: DC
  Administered 2016-07-26 (×3): via INTRAVENOUS

## 2016-07-26 MED ORDER — INSULIN ASPART 100 UNIT/ML ~~LOC~~ SOLN
0.0000 [IU] | SUBCUTANEOUS | Status: DC
  Administered 2016-07-26: 2 [IU] via SUBCUTANEOUS
  Administered 2016-07-26 – 2016-07-27 (×2): 3 [IU] via SUBCUTANEOUS
  Administered 2016-07-27: 2 [IU] via SUBCUTANEOUS
  Administered 2016-07-27: 5 [IU] via SUBCUTANEOUS
  Filled 2016-07-26: qty 2
  Filled 2016-07-26: qty 5
  Filled 2016-07-26: qty 3
  Filled 2016-07-26: qty 2
  Filled 2016-07-26: qty 3

## 2016-07-26 MED ORDER — METHYLPREDNISOLONE SODIUM SUCC 125 MG IJ SOLR
125.0000 mg | Freq: Once | INTRAMUSCULAR | Status: AC
  Administered 2016-07-26: 125 mg via INTRAVENOUS
  Filled 2016-07-26: qty 2

## 2016-07-26 MED ORDER — ACETAMINOPHEN 325 MG PO TABS: 650.0000 mg | ORAL_TABLET | Freq: Four times a day (QID) | ORAL | Status: DC | PRN

## 2016-07-26 MED ORDER — SODIUM CHLORIDE 0.9 % IV SOLN: 250.0000 mL | INTRAVENOUS | Status: DC | PRN

## 2016-07-26 MED ORDER — ONDANSETRON HCL 4 MG/2ML IJ SOLN: 4.0000 mg | Freq: Four times a day (QID) | INTRAMUSCULAR | Status: DC | PRN

## 2016-07-26 MED ORDER — SODIUM CHLORIDE 0.9 % IV SOLN
10.0000 mL/h | Freq: Once | INTRAVENOUS | Status: AC
  Administered 2016-07-26: 10 mL/h via INTRAVENOUS

## 2016-07-26 MED ORDER — AMLODIPINE BESYLATE 10 MG PO TABS
10.0000 mg | ORAL_TABLET | Freq: Every day | ORAL | Status: DC
  Administered 2016-07-27: 10 mg via ORAL
  Filled 2016-07-26: qty 1

## 2016-07-26 MED ORDER — METHYLPREDNISOLONE SODIUM SUCC 125 MG IJ SOLR
60.0000 mg | Freq: Four times a day (QID) | INTRAMUSCULAR | Status: DC
  Administered 2016-07-26 – 2016-07-27 (×5): 60 mg via INTRAVENOUS
  Filled 2016-07-26 (×5): qty 2

## 2016-07-26 MED ORDER — DARIFENACIN HYDROBROMIDE ER 7.5 MG PO TB24
7.5000 mg | ORAL_TABLET | Freq: Every day | ORAL | Status: DC
  Administered 2016-07-27: 7.5 mg via ORAL
  Filled 2016-07-26 (×2): qty 1

## 2016-07-26 MED ORDER — QUETIAPINE FUMARATE 100 MG PO TABS
100.0000 mg | ORAL_TABLET | Freq: Every day | ORAL | Status: DC
  Administered 2016-07-27: 100 mg via ORAL
  Filled 2016-07-26: qty 1

## 2016-07-26 MED ORDER — ENOXAPARIN SODIUM 40 MG/0.4ML ~~LOC~~ SOLN
40.0000 mg | Freq: Two times a day (BID) | SUBCUTANEOUS | Status: DC
  Administered 2016-07-26 – 2016-07-27 (×2): 40 mg via SUBCUTANEOUS
  Filled 2016-07-26 (×2): qty 0.4

## 2016-07-26 MED ORDER — FAMOTIDINE IN NACL 20-0.9 MG/50ML-% IV SOLN
20.0000 mg | Freq: Once | INTRAVENOUS | Status: AC
  Administered 2016-07-26: 20 mg via INTRAVENOUS
  Filled 2016-07-26: qty 50

## 2016-07-26 MED ORDER — ASPIRIN EC 81 MG PO TBEC
81.0000 mg | DELAYED_RELEASE_TABLET | Freq: Every day | ORAL | Status: DC
  Administered 2016-07-27: 81 mg via ORAL
  Filled 2016-07-26: qty 1

## 2016-07-26 MED ORDER — ENOXAPARIN SODIUM 40 MG/0.4ML ~~LOC~~ SOLN: 40.0000 mg | SUBCUTANEOUS | Status: DC

## 2016-07-26 MED ORDER — GUAIFENESIN-CODEINE 100-10 MG/5ML PO SOLN: 10.0000 mL | ORAL | Status: DC | PRN

## 2016-07-26 MED ORDER — CLONAZEPAM 0.5 MG PO TABS
0.2500 mg | ORAL_TABLET | Freq: Two times a day (BID) | ORAL | Status: DC
  Administered 2016-07-26 – 2016-07-27 (×2): 0.25 mg via ORAL
  Filled 2016-07-26 (×2): qty 1

## 2016-07-26 MED ORDER — DIPHENHYDRAMINE HCL 50 MG/ML IJ SOLN: 25.0000 mg | Freq: Three times a day (TID) | INTRAMUSCULAR | Status: DC | PRN

## 2016-07-26 MED ORDER — PANTOPRAZOLE SODIUM 40 MG PO TBEC
40.0000 mg | DELAYED_RELEASE_TABLET | Freq: Every day | ORAL | Status: DC
  Administered 2016-07-27: 40 mg via ORAL
  Filled 2016-07-26: qty 1

## 2016-07-26 MED ORDER — BENZONATATE 100 MG PO CAPS
100.0000 mg | ORAL_CAPSULE | Freq: Three times a day (TID) | ORAL | Status: DC
  Administered 2016-07-27 (×2): 100 mg via ORAL
  Filled 2016-07-26 (×2): qty 1

## 2016-07-26 MED ORDER — HYDRALAZINE HCL 20 MG/ML IJ SOLN
10.0000 mg | INTRAMUSCULAR | Status: DC | PRN
  Administered 2016-07-27: 10 mg via INTRAVENOUS
  Filled 2016-07-26 (×2): qty 1

## 2016-07-26 MED ORDER — ONDANSETRON HCL 4 MG PO TABS: 4.0000 mg | ORAL_TABLET | Freq: Four times a day (QID) | ORAL | Status: DC | PRN

## 2016-07-26 MED ORDER — DIPHENHYDRAMINE HCL 50 MG/ML IJ SOLN
25.0000 mg | Freq: Once | INTRAMUSCULAR | Status: AC
  Administered 2016-07-26: 25 mg via INTRAVENOUS
  Filled 2016-07-26: qty 1

## 2016-07-26 MED ORDER — SODIUM CHLORIDE 0.9% FLUSH: 3.0000 mL | INTRAVENOUS | Status: DC | PRN

## 2016-07-26 MED ORDER — SODIUM CHLORIDE 0.9% FLUSH
3.0000 mL | Freq: Two times a day (BID) | INTRAVENOUS | Status: DC
  Administered 2016-07-26 – 2016-07-27 (×3): 3 mL via INTRAVENOUS

## 2016-07-26 MED ORDER — SENNOSIDES-DOCUSATE SODIUM 8.6-50 MG PO TABS: 1.0000 | ORAL_TABLET | Freq: Every evening | ORAL | Status: DC | PRN

## 2016-07-26 MED ORDER — FAMOTIDINE IN NACL 20-0.9 MG/50ML-% IV SOLN
20.0000 mg | Freq: Every day | INTRAVENOUS | Status: DC
  Administered 2016-07-27: 20 mg via INTRAVENOUS
  Filled 2016-07-26: qty 50

## 2016-07-26 NOTE — ED Notes (Signed)
Spoke with admitting MD. Pt to go to tele floor. 1C notified pt not coming to floor. Pt updated on changes.

## 2016-07-26 NOTE — Progress Notes (Signed)
[facility] NAME: Kathryn Kramer    MR#:  [id number]  DATE OF BIRTH:  Dec 28, 1950  SUBJECTIVE:   Patient here due to tongue swelling and noted to have angioedema secondary to Ace inhibitors. Hemodynamically stable and no shortness of breath.  REVIEW OF SYSTEMS:    Review of Systems  Constitutional: Negative for chills and fever.  HENT: Negative for congestion and tinnitus.        + Tongue swelling  Eyes: Negative for blurred vision and double vision.  Respiratory: Negative for cough, shortness of breath and wheezing.   Cardiovascular: Negative for chest pain, orthopnea and PND.  Gastrointestinal: Negative for abdominal pain, diarrhea, nausea and vomiting.  Genitourinary: Negative for dysuria and hematuria.  Neurological: Negative for dizziness, sensory change and focal weakness.  All other systems reviewed and are negative.   Nutrition: NPO Tolerating Diet: No Tolerating PT: Await Eval.      DRUG ALLERGIES:   Allergies  Allergen Reactions  . Ace Inhibitors Swelling  . Pravastatin Itching    Per patient "itching on the bottom of feet"    VITALS:  Blood pressure (!) 146/97, pulse 81, temperature 97.5 F (36.4 C), temperature source Oral, resp. rate (!) 27, height 5\' 1"  (1.549 m), weight 102.7 kg (226 lb 6.6 oz), SpO2 99 %.  PHYSICAL EXAMINATION:   Physical Exam  GENERAL:  66 y.o.-year-old obese patient lying in bed in no acute distress.  EYES: Pupils equal, round, reactive to light and accommodation. No scleral icterus. Extraocular muscles intact.  HEENT: Head atraumatic, normocephalic. Oropharynx and nasopharynx clear. + tongue swelling.  NECK:  Supple, no jugular venous distention. No thyroid enlargement, no tenderness.  LUNGS: Normal breath sounds bilaterally, no wheezing, rales, rhonchi. No use of accessory muscles of respiration.  CARDIOVASCULAR: S1, S2 normal. No murmurs, rubs, or gallops.  ABDOMEN: Soft,  nontender, nondistended. Bowel sounds present. No organomegaly or mass.  EXTREMITIES: No cyanosis, clubbing or edema b/l.    NEUROLOGIC: Cranial nerves II through XII are intact. No focal Motor or sensory deficits b/l.   PSYCHIATRIC: The patient is alert and oriented x 3.  SKIN: No obvious rash, lesion, or ulcer.    LABORATORY PANEL:   CBC  Recent Labs Lab 07/26/16 1310  WBC 12.1*  HGB 13.1  HCT 40.4  PLT 266   ------------------------------------------------------------------------------------------------------------------  Chemistries   Recent Labs Lab 07/26/16 1035 07/26/16 1310  NA 138  --   K 4.6  --   CL 108  --   CO2 22  --   GLUCOSE 217*  --   BUN 26*  --   CREATININE 1.75* 1.74*  CALCIUM 9.2  --    ------------------------------------------------------------------------------------------------------------------  Cardiac Enzymes No results for input(s): TROPONINI in the last 168 hours. ------------------------------------------------------------------------------------------------------------------  RADIOLOGY:  No results found.   ASSESSMENT AND PLAN:   66 year old female with past medical history of essential hypertension, diabetes, chronic kidney disease stage III, previous CVA, GERD who presented to the hospital due to tongue swelling and noted to have angioedema secondary to Ace inhibitors.  1. Angioedema-secondary to Ace inhibitors. Patient was on benazepril. Which has been discontinued. -Continue IV steroids, Benadryl, Pepcid.  2. Diabetes type 2 without complication-continue sliding scale insulin.  3. Depression-continue Celexa.  4. Essential hypertension-continue Norvasc.  5. GERD-continue Protonix.  6. Depression-continue Seroquel.  All the records are reviewed and case discussed with Care Management/Social Worker. Management plans discussed with the patient, family and they are  in agreement.  CODE STATUS: Full code  DVT  Prophylaxis: Lovenox  TOTAL TIME TAKING CARE OF THIS PATIENT: 30 minutes.   POSSIBLE D/C IN 1-2 DAYS, DEPENDING ON CLINICAL CONDITION.   Henreitta Leber M.D on 07/26/2016 at 2:38 PM  Between 7am to 6pm - Pager - [phone]  After 6pm go to [email or web address] - [employer] [facility]  Office  [phone]  CC: Primary care physician; [employer]

## 2016-07-26 NOTE — Progress Notes (Signed)
[facility] Consultation    SYNOPSIS   Mrs. Lein is a very pleasant 66 year old African-American female with a past medical history remarkable for hypertension, congestive heart failure, chronic renal insufficiency, type 1 diabetes, gastroesophageal reflux disease, anxiety disorder, presented to the emergency department with complaints of swelling of the tongue. This started around 12:00 midnight and increased in size giving the patient a choking sensation. She presented to the emergency department was noted to have swollen tongue and lips. She has been taken an ACE inhibitor for some time now. She was given epi injection emergency department along with IV Solu-Medrol and Benadryl. Her swelling has improved some and she has been able to communicate. Her tongue is still significantly swollen and she will be monitored in the intensive care unit.  ASSESSMENT/PLAN   Angioedema most likely secondary to ace inhibition. She is presently maintaining her airway, agree with Solu-Medrol, Benadryl, H2 blocker, will follow in the ICU to make sure airway stabilizes.  Chronic renal insufficiency. Stable  Congestive heart failure. No evidence of active failure at this time  Diabetes mellitus. Managed per hospitalist service  Hypertension. On amlodipine  INTAKE / OUTPUT:  Intake/Output Summary (Last 24 hours) at 07/26/16 1114 Last data filed at 07/26/16 0452  Gross per 24 hour  Intake              256 ml  Output                0 ml  Net              256 ml     Name: MYRTHA TONKOVICH MRN: [id number] DOB: 10/20/1950    ADMISSION DATE:  07/26/2016 CONSULTATION DATE:  07/26/2016  REFERRING MD :  Hospitalist Service  CHIEF COMPLAINT:  Tongue swelling   HISTORY OF PRESENT ILLNESS:   Mrs. Longo is a very pleasant 66 year old African-American female with a past medical history remarkable for hypertension, congestive heart failure, chronic renal insufficiency, type 1 diabetes,  gastroesophageal reflux disease, anxiety disorder, presented to the emergency department with complaints of swelling of the tongue. This started around 12:00 midnight and increased in size giving the patient a choking sensation. She presented to the emergency department was noted to have swollen tongue and lips. She has been taken an ACE inhibitor for some time now. She was given epi injection emergency department along with IV Solu-Medrol and Benadryl. Her swelling has improved some and she has been able to communicate. Her tongue is still significantly swollen and she will be monitored in the intensive care unit.  PAST MEDICAL HISTORY :  Past Medical History:  Diagnosis Date  . Acid reflux   . Anxiety   . Arrhythmia   . BP (high blood pressure) 01/25/2013  . CCF (congestive cardiac failure) ([place]) 08/04/2013  . CHF (congestive heart failure) ([place])   . Chronic kidney disease   . Constipation   . Cystocele, grade 2   . Diabetes mellitus type I ([place])   . Diabetes mellitus without complication ([place])   . Female incontinence   . Hypertension   . Recurrent UTI   . Stroke [facility]) 2001   Past Surgical History:  Procedure Laterality Date  . COLONOSCOPY  02/16/13  . elbow     pinch nerve  . HAND SURGERY Bilateral [id number]   carpel tunnel  . HIP SURGERY Right 1999  . MULTIPLE TOOTH EXTRACTIONS  2014  . TUBAL LIGATION  1985   Prior to Admission medications  Medication Sig Start Date End Date Taking? Authorizing Provider  amLODipine (NORVASC) 10 MG tablet Take 1 tablet by mouth daily. 12/17/12  Yes [provider]  aspirin 81 MG tablet Take 81 mg by mouth daily.   Yes [provider]  atorvastatin (LIPITOR) 20 MG tablet Take 1 tablet by mouth at bedtime.  12/18/12  Yes [provider]  baclofen (LIORESAL) 10 MG tablet Take 1 tablet by mouth 3 (three) times daily. 12/18/12  Yes [provider]  benazepril (LOTENSIN) 20 MG tablet Take 1 tablet by mouth daily.  12/18/12  Yes [provider]  Calcium Carbonate-Vitamin D (CALCIUM + D PO) Take 2 tablets by mouth daily.   Yes [provider]  carvedilol (COREG) 25 MG tablet Take 25 mg by mouth 2 (two) times daily with a meal. Reported on 05/29/2015 05/25/15  Yes [provider]  citalopram (CELEXA) 40 MG tablet Take 1 tablet (40 mg total) by mouth daily. 07/12/15  Yes Rainey Pines, MD  clonazePAM (KLONOPIN) 0.25 MG disintegrating tablet Take 0.25 mg by mouth 2 (two) times daily as needed for anxiety.   Yes [provider]  cloNIDine (CATAPRES) 0.2 MG tablet Take 1 tablet by mouth 2 (two) times daily. 06/20/16  Yes [provider]  furosemide (LASIX) 20 MG tablet Take 1 tablet by mouth 2 (two) times daily.  12/17/12  Yes [provider]  glipiZIDE (GLUCOTROL) 5 MG tablet Take 5 mg by mouth 2 (two) times daily before a meal.  12/18/12  Yes [provider]  insulin glargine (LANTUS) 100 UNIT/ML injection Inject 20 Units into the skin daily.   Yes [provider]  mirabegron ER (MYRBETRIQ) 25 MG TB24 tablet Take 1 tablet (25 mg total) by mouth daily. 07/03/15  Yes McGowan, Larene Beach A, PA-C  omeprazole (PRILOSEC) 20 MG capsule Take 2 capsules by mouth daily. 11/25/12  Yes [provider]  PREMARIN vaginal cream Place 1 g vaginally once a week.  07/01/16  Yes [provider]  PROAIR HFA 108 (90 BASE) MCG/ACT inhaler Inhale 2 puffs into the lungs every 8 (eight) hours. 10/22/12  Yes [provider]  spironolactone (ALDACTONE) 25 MG tablet Take 0.5 tablets by mouth daily. 06/20/16  Yes [provider]  estradiol (ESTRACE) 0.1 MG/GM vaginal cream Place 1 Applicatorful vaginally at bedtime. Patient was given a sample of vaginal estrogen cream and instructed to apply 0.5mg  (pea-sized amount)  just inside the vaginal introitus with a finger-tip every night for two weeks and then Monday, Wednesday and Friday nights. Patient not  taking: Reported on 07/26/2016 08/09/14   Zara Council A, PA-C  QUEtiapine (SEROQUEL) 100 MG tablet Take 1 tablet (100 mg total) by mouth daily. Patient not taking: Reported on 07/26/2016 07/12/15   Rainey Pines, MD  solifenacin (VESICARE) 10 MG tablet Take 1 tablet (10 mg total) by mouth daily. Patient not taking: Reported on 07/26/2016 08/09/14   Zara Council A, PA-C   Allergies  Allergen Reactions  . Ace Inhibitors Swelling  . Pravastatin Itching    Per patient "itching on the bottom of feet"    FAMILY HISTORY:  Family History  Problem Relation Age of Onset  . Colon cancer Father   . Colon polyps Brother   . Colon cancer Brother   . Diabetes Mother   . Alcohol abuse Mother   . Heart disease Sister   . Breast cancer Sister   . Alcohol abuse Brother   . Drug abuse Brother   .  Alcohol abuse Brother   . Drug abuse Brother   . Alcohol abuse Brother   . Drug abuse Brother   . Alcohol abuse Brother   . Drug abuse Brother   . Alcohol abuse Brother   . Drug abuse Brother   . Alcohol abuse Brother   . Drug abuse Brother   . Colon polyps Daughter   . Diabetes Daughter   . Kidney disease Neg Hx   . Bladder Cancer Neg Hx   . Ovarian cancer Neg Hx    SOCIAL HISTORY:  reports that she has been smoking Cigarettes.  She has a 10.00 pack-year smoking history. She has never used smokeless tobacco. She reports that she does not drink alcohol or use drugs.  REVIEW OF SYSTEMS:   Constitutional: Feels well. Cardiovascular: No chest pain.  Pulmonary: Denies dyspnea.   The remainder of systems were reviewed and were found to be negative other than what is documented in the HPI.    VITAL SIGNS: Temp:  [97.8 F (36.6 C)-98.9 F (37.2 C)] 97.8 F (36.6 C) (05/25 0509) Pulse Rate:  [29-85] 81 (05/25 1030) Resp:  [14-42] 21 (05/25 1030) BP: (115-155)/(45-92) 139/91 (05/25 1030) SpO2:  [89 %-100 %] 89 % (05/25 1030) Weight:  [104.3 kg (230 lb)] 104.3 kg (230 lb) (05/25  0205) HEMODYNAMICS:     INTAKE / OUTPUT:  Intake/Output Summary (Last 24 hours) at 07/26/16 1114 Last data filed at 07/26/16 0452  Gross per 24 hour  Intake              256 ml  Output                0 ml  Net              256 ml    Physical Examination:   VS: BP (!) 139/91   Pulse 81   Temp 97.8 F (36.6 C) (Oral)   Resp (!) 21   Ht 5\' 1"  (1.549 m)   Wt 104.3 kg (230 lb)   SpO2 (!) 89%   BMI 43.46 kg/m   General Appearance: No distress  Neuro:without focal findings, mental status, speech normal,. HEENT: Tongue and lip swelling noted, small posterior airway, patient states it is not progressing at this time, no stridor appreciated, trachea is midline, lying down flat and on her side, no drooling, maintaining her airway without accessory muscle utilization  Pulmonary: normal breath sounds., diaphragmatic excursion normal. CardiovascularNormal S1,S2.  No m/r/g.    Abdomen: Benign, Soft, non-tender, No masses, hepatosplenomegaly, No lymphadenopathy Endoc: No evident thyromegaly, no signs of acromegaly. Skin:   warm, no rashes, no ecchymosis  Extremities: normal, no cyanosis, clubbing, no edema, warm with normal capillary refill.    LABS: Reviewed   LABORATORY PANEL:   CBC No results for input(s): WBC, HGB, HCT, PLT in the last 168 hours.  Chemistries   Recent Labs Lab 07/26/16 1035  NA 138  K 4.6  CL 108  CO2 22  GLUCOSE 217*  BUN 26*  CREATININE 1.75*  CALCIUM 9.2    No results for input(s): GLUCAP in the last 168 hours. No results for input(s): PHART, PCO2ART, PO2ART in the last 168 hours. No results for input(s): AST, ALT, ALKPHOS, BILITOT, ALBUMIN in the last 168 hours.  Cardiac Enzymes No results for input(s): TROPONINI in the last 168 hours.  RADIOLOGY:  No results found.    Hermelinda Dellen, DO ICU Pager [phone] [facility] Office Number: [phone]  Patricia Pesa, M.D.  Merton Border, M.D   07/26/2016,  11:14 AM

## 2016-07-26 NOTE — ED Notes (Signed)
Pt trasnported to room

## 2016-07-26 NOTE — ED Notes (Signed)
ICU Charge Roselyn Reef called for more information and update on pt. Report given on pt. RN to check for ready bed.

## 2016-07-26 NOTE — ED Notes (Signed)
Hold on transporting pt to floor at this time

## 2016-07-26 NOTE — ED Notes (Signed)
Pt suctioned. Call bell in reach

## 2016-07-26 NOTE — ED Notes (Signed)
Pt resting comfortably. No complaints of breathing trouble.

## 2016-07-26 NOTE — ED Notes (Signed)
Pt tongue swelling increased upon assesssment. No airway difficulty noted. Pt A&O.  Medications given. Floor notified of wait for return call from admitting doctor.

## 2016-07-26 NOTE — Progress Notes (Signed)
66 y/o F ordered Lovenox 40 mg daily for DVT prophylaxis.   Filed Weights   07/26/16 0205  Weight: 230 lb (104.3 kg)   Estimated Creatinine Clearance: 35.6 mL/min (A) (by C-G formula based on SCr of 1.75 mg/dL (H)).  Body mass index is 43.46 kg/m.   Will increase Lovenox dosing to 40 mg bid for weight > 100 kg and BMI > 40.   Ulice Dash, PharmD Clinical Pharmacist

## 2016-07-26 NOTE — ED Notes (Signed)
MD Sainani notified of pt's condition. Orders received.

## 2016-07-26 NOTE — ED Provider Notes (Signed)
[facility] Emergency Department Provider Note    First MD Initiated Contact with Patient 07/26/16 0209     (approximate)  I have reviewed the triage vital signs and the nursing notes.   HISTORY  Chief Complaint Angioedema    HPI Kathryn Kramer is a 66 y.o. female history of hypertension on the Benazepril presents with acute swelling to the left side of her tongue that started around midnight tonight. Patient presented with stuttering speech due to swollen tongue but is protecting her airway. She denies any difficulty swallowing and does not feel short of breath. Does not feel this is rapidly worsening. Denies any urticaria or itching. No new rashes. This never happened before. She has no family history of angioedema that she is aware of.   Past Medical History:  Diagnosis Date  . Acid reflux   . Anxiety   . Arrhythmia   . BP (high blood pressure) 01/25/2013  . CCF (congestive cardiac failure) ([place]) 08/04/2013  . CHF (congestive heart failure) ([place])   . Chronic kidney disease   . Constipation   . Cystocele, grade 2   . Diabetes mellitus type I ([place])   . Diabetes mellitus without complication ([place])   . Female incontinence   . Hypertension   . Recurrent UTI   . Stroke [facility]) 2001   Family History  Problem Relation Age of Onset  . Colon cancer Father   . Colon polyps Brother   . Colon cancer Brother   . Diabetes Mother   . Alcohol abuse Mother   . Heart disease Sister   . Breast cancer Sister   . Alcohol abuse Brother   . Drug abuse Brother   . Alcohol abuse Brother   . Drug abuse Brother   . Alcohol abuse Brother   . Drug abuse Brother   . Alcohol abuse Brother   . Drug abuse Brother   . Alcohol abuse Brother   . Drug abuse Brother   . Alcohol abuse Brother   . Drug abuse Brother   . Colon polyps Daughter   . Diabetes Daughter   . Kidney disease Neg Hx   . Bladder Cancer Neg Hx   . Ovarian cancer Neg Hx    Past Surgical History:    Procedure Laterality Date  . COLONOSCOPY  02/16/13  . elbow     pinch nerve  . HAND SURGERY Bilateral 6063,0160   carpel tunnel  . HIP SURGERY Right 1999  . MULTIPLE TOOTH EXTRACTIONS  2014  . TUBAL LIGATION  1985   Patient Active Problem List   Diagnosis Date Noted  . Angioedema 07/26/2016  . Urinary incontinence, mixed 07/06/2015  . Morbid (severe) obesity due to excess calories ([place]) 01/19/2015  . Morbid obesity ([place]) 12/07/2014  . Recurrent UTI 10/29/2014  . Extreme obesity 08/09/2014  . Cystocele, grade 2 08/09/2014  . Atrophic vaginitis 08/09/2014  . Incontinence 08/09/2014  . Compulsive tobacco user syndrome 05/09/2014  . Current tobacco use 05/09/2014  . History of colonic polyps 02/21/2014  . CCF (congestive cardiac failure) ([place]) 08/04/2013  . Chronic kidney disease (CKD), stage III (moderate) 01/25/2013  . Diabetes ([place]) 01/25/2013  . BP (high blood pressure) 01/25/2013  . Angiopathy, peripheral ([place]) 01/25/2013  . Peripheral arterial occlusive disease ([place]) 01/25/2013  . Encounter for screening colonoscopy 12/23/2012  . Rectal bleeding 12/23/2012      Prior to Admission medications   Medication Sig Start Date End Date Taking? Authorizing Provider  amLODipine (  NORVASC) 10 MG tablet Take 1 tablet by mouth daily. 12/17/12  Yes [provider]  aspirin 81 MG tablet Take 81 mg by mouth daily.   Yes [provider]  atorvastatin (LIPITOR) 20 MG tablet Take 1 tablet by mouth daily. 12/18/12  Yes [provider]  baclofen (LIORESAL) 10 MG tablet Take 1 tablet by mouth 3 (three) times daily. 12/18/12  Yes [provider]  benazepril (LOTENSIN) 20 MG tablet Take 1 tablet by mouth daily. 12/18/12  Yes [provider]  Calcium Carbonate-Vitamin D (CALCIUM + D PO) Take 2 tablets by mouth daily.   Yes [provider]  carvedilol (COREG) 25 MG tablet Take 25 mg by mouth 2 (two) times daily with a meal. Reported on  05/29/2015 05/25/15  Yes [provider]  citalopram (CELEXA) 40 MG tablet Take 1 tablet (40 mg total) by mouth daily. 07/12/15  Yes Rainey Pines, MD  cloNIDine (CATAPRES) 0.2 MG tablet Take 1 tablet by mouth 2 (two) times daily. 06/20/16  Yes [provider]  estradiol (ESTRACE) 0.1 MG/GM vaginal cream Place 1 Applicatorful vaginally at bedtime. Patient was given a sample of vaginal estrogen cream and instructed to apply 0.5mg  (pea-sized amount)  just inside the vaginal introitus with a finger-tip every night for two weeks and then Monday, Wednesday and Friday nights. 08/09/14  Yes McGowan, Larene Beach A, PA-C  estrogens, conjugated, (PREMARIN) 0.625 MG tablet Take 0.625 mg by mouth. Reported on 07/03/2015   Yes [provider]  furosemide (LASIX) 20 MG tablet Take 1 tablet by mouth 2 (two) times daily.  12/17/12  Yes [provider]  glipiZIDE (GLUCOTROL) 5 MG tablet Take 1 tablet by mouth daily. 12/18/12  Yes [provider]  insulin glargine (LANTUS) 100 UNIT/ML injection Inject 20 Units into the skin daily.   Yes [provider]  mirabegron ER (MYRBETRIQ) 25 MG TB24 tablet Take 1 tablet (25 mg total) by mouth daily. 07/03/15  Yes McGowan, Larene Beach A, PA-C  omeprazole (PRILOSEC) 20 MG capsule Take 2 capsules by mouth daily. 11/25/12  Yes [provider]  PREMARIN vaginal cream Place 1 application vaginally daily. 07/01/16  Yes [provider]  PROAIR HFA 108 (90 BASE) MCG/ACT inhaler Inhale 2 puffs into the lungs every 8 (eight) hours. 10/22/12  Yes [provider]  QUEtiapine (SEROQUEL) 100 MG tablet Take 1 tablet (100 mg total) by mouth daily. 07/12/15  Yes Rainey Pines, MD  solifenacin (VESICARE) 10 MG tablet Take 1 tablet (10 mg total) by mouth daily. 08/09/14  Yes McGowan, Larene Beach A, PA-C  spironolactone (ALDACTONE) 25 MG tablet Take 0.5 tablets by mouth daily. 06/20/16  Yes [provider]     Allergies Pravastatin    Social History Social History  Substance Use Topics  . Smoking status: Current Every Day Smoker    Packs/day: 0.50    Years: 20.00    Types: Cigarettes  . Smokeless tobacco: Never Used  . Alcohol use No    Review of Systems Patient denies headaches, rhinorrhea, blurry vision, numbness, shortness of breath, chest pain, edema, cough, abdominal pain, nausea, vomiting, diarrhea, dysuria, fevers, rashes or hallucinations unless otherwise stated above in HPI. ____________________________________________   PHYSICAL EXAM:  VITAL SIGNS: Vitals:   07/26/16 0630 07/26/16 0645  BP: 131/77 140/60  Pulse: 76 75  Resp: (!) 28 (!) 24  Temp:      Constitutional: Alert and oriented. Eyes: Conjunctivae are normal. PERRL. EOMI. Head: Atraumatic. Nose: No congestion/rhinnorhea. Mouth/Throat: Mucous  membranes are moist.  Oropharynx non-erythematous. + angioedema to left side of tongue, no uvular edema or trismus Neck: No stridor. Painless ROM. No cervical spine tenderness to palpation Hematological/Lymphatic/Immunilogical: No cervical lymphadenopathy. Cardiovascular: Normal rate, regular rhythm. Grossly normal heart sounds.  Good peripheral circulation. Respiratory: Normal respiratory effort.  No retractions. Lungs CTAB. Gastrointestinal: Soft and nontender. No distention. No abdominal bruits. No CVA tenderness. Musculoskeletal: No lower extremity tenderness nor edema.  No joint effusions. Neurologic:  Normal speech and language. No gross focal neurologic deficits are appreciated. No gait instability. Skin:  Skin is warm, dry and intact. No rash noted. Psychiatric: Mood and affect are normal. Speech and behavior are normal.  ____________________________________________   LABS (all labs ordered are listed, but only abnormal results are displayed)  Results for orders placed or performed during the hospital encounter of 07/26/16 (from the past 24 hour(s))   ABO/Rh     Status: None   Collection Time: 07/26/16  2:28 AM  Result Value Ref Range   ABO/RH(D) O POS   Prepare fresh frozen plasma     Status: None (Preliminary result)   Collection Time: 07/26/16  3:21 AM  Result Value Ref Range   Unit Number [id number]    Blood Component Type THAWED PLASMA    Unit division 00    Status of Unit ISSUED    Transfusion Status OK TO TRANSFUSE    ____________________________________________  EKG____________________________________________  RADIOLOGY   ____________________________________________   PROCEDURES  Procedure(s) performed:  Procedures    Critical Care performed: CRITICAL CARE Performed by: Merlyn Lot   Total critical care time: 30 minutes  Critical care time was exclusive of separately billable procedures and treating other patients.  Critical care was necessary to treat or prevent imminent or life-threatening deterioration.  Critical care was time spent personally by me on the following activities: development of treatment plan with patient and/or surrogate as well as nursing, discussions with consultants, evaluation of patient's response to treatment, examination of patient, obtaining history from patient or surrogate, ordering and performing treatments and interventions, ordering and review of laboratory studies, ordering and review of radiographic studies, pulse oximetry and re-evaluation of patient's condition.  ____________________________________________   INITIAL IMPRESSION / ASSESSMENT AND PLAN / ED COURSE  Pertinent labs & imaging results that were available during my care of the patient were reviewed by me and considered in my medical decision making (see chart for details).  DDX: angioedema, urticaria, anaphylaxis  ASAMI LAMBRIGHT is a 66 y.o. who presents to the ED with angioedema of the left tongue as described above. Patient currently protecting her airway. Most like secondary to benazapril use. Will  give steroids and H1 and H2 blockers as well as epi pin injection. We'll continue to monitor patient. ----------------------------------------- 2:17 AM on 07/26/2016 -----------------------------------------   OBSERVATION CARE: This patient is being placed under observation care for the following reasons: angioedema     Clinical Course as of Jul 26 704  Fri Jul 26, 2016  0320 Patient reassessed. Still protecting airway. Maybe slight worsening of the swelling of her tongue. Will give FFP.  [PR]  0532 Patient was swallowing 2 bilateral sides now. Still protecting her airway. No hypoxia. This went to fill patient will require observation and a closer monitored setting she is currently not responding at this point. Do not feel she requires intubation at this time we'll continue to closely monitor.  [PR]  2500  ----------------------------------------- 6:46 AM on @EDTODAY @ -----------------------------------------   END OF OBSERVATION STATUS:  After an appropriate period of observation, this patient is being admitted due to the following reason(s): Persistent angioedema requiring additional monitoring.    [PR]    Clinical Course User Index [PR] Merlyn Lot, MD     ____________________________________________   FINAL CLINICAL IMPRESSION(S) / ED DIAGNOSES  Final diagnoses:  Angioedema, initial encounter      NEW MEDICATIONS STARTED DURING THIS VISIT:  New Prescriptions   No medications on file     Note:  This document was prepared using Dragon voice recognition software and may include unintentional dictation errors.    Merlyn Lot, MD 07/26/16 [phone]

## 2016-07-26 NOTE — ED Notes (Signed)
Monitor showing trigeminy on pt. MD notified

## 2016-07-26 NOTE — ED Notes (Signed)
Pt complaining of increasing tongue swelling. Tongue looks more swollen than 20 minutes ago. No airway compromise at this time.

## 2016-07-26 NOTE — ED Notes (Signed)
Pt transported to room 112. 

## 2016-07-26 NOTE — ED Triage Notes (Signed)
Pt in with tongue swelling that started at 0000 tonight, unknown cause but it is on several bp meds.

## 2016-07-26 NOTE — H&P (Signed)
[place] NAME: Kathryn Kramer    MR#:  [id number]  DATE OF BIRTH:  [birth year]  DATE OF ADMISSION:  07/26/2016  PRIMARY CARE PHYSICIAN: [facility] [place]   REQUESTING/REFERRING PHYSICIAN:   CHIEF COMPLAINT:   Chief Complaint  Patient presents with  . Angioedema    HISTORY OF PRESENT ILLNESS: Kathryn Kramer  is a 66 y.o. female with a known history of GERD, anxiety disorder, hypertension, congestive heart failure, chronic kidney disease, constipation, type 1 diabetes mellitus presented to the emergency room with swelling of the tongue. The swelling of the tongue started around 12 midnight. It increases in size and patient had a choking sensation. She presented to the emergency room with swollen tongue and swollen lips. Patient has been taking ACE inhibitor for a long time. She was given epi injection in the emergency room and was given IV Solu-Medrol and fresh frozen plasma and benadryl IV. Her swelling little bit improved and the patient was able to communicate and speak better. No difficulty swallowing. No shortness of breath, orthopnea. Hospitalist service was consulted for further care of the patient.  PAST MEDICAL HISTORY:   Past Medical History:  Diagnosis Date  . Acid reflux   . Anxiety   . Arrhythmia   . BP (high blood pressure) 01/25/2013  . CCF (congestive cardiac failure) ([place]) 08/04/2013  . CHF (congestive heart failure) ([place])   . Chronic kidney disease   . Constipation   . Cystocele, grade 2   . Diabetes mellitus type I ([place])   . Diabetes mellitus without complication ([place])   . Female incontinence   . Hypertension   . Recurrent UTI   . Stroke [facility]) 2001    PAST SURGICAL HISTORY:  Past Surgical History:  Procedure Laterality Date  . COLONOSCOPY  02/16/13  . elbow     pinch nerve  . HAND SURGERY Bilateral [id number]   carpel tunnel  . HIP SURGERY Right 1999  . MULTIPLE TOOTH EXTRACTIONS  2014  .  TUBAL LIGATION  1985    SOCIAL HISTORY:  Social History  Substance Use Topics  . Smoking status: Current Every Day Smoker    Packs/day: 0.50    Years: 20.00    Types: Cigarettes  . Smokeless tobacco: Never Used  . Alcohol use No    FAMILY HISTORY:  Family History  Problem Relation Age of Onset  . Colon cancer Father   . Colon polyps Brother   . Colon cancer Brother   . Diabetes Mother   . Alcohol abuse Mother   . Heart disease Sister   . Breast cancer Sister   . Alcohol abuse Brother   . Drug abuse Brother   . Alcohol abuse Brother   . Drug abuse Brother   . Alcohol abuse Brother   . Drug abuse Brother   . Alcohol abuse Brother   . Drug abuse Brother   . Alcohol abuse Brother   . Drug abuse Brother   . Alcohol abuse Brother   . Drug abuse Brother   . Colon polyps Daughter   . Diabetes Daughter   . Kidney disease Neg Hx   . Bladder Cancer Neg Hx   . Ovarian cancer Neg Hx     DRUG ALLERGIES:  Allergies  Allergen Reactions  . Pravastatin Itching    Per patient "itching on the bottom of feet"    REVIEW OF SYSTEMS:   CONSTITUTIONAL: No fever, fatigue or  weakness.  EYES: No blurred or double vision.  EARS, NOSE, AND THROAT: No tinnitus or ear pain.  Swollen tongue and lips RESPIRATORY: No cough, shortness of breath, wheezing or hemoptysis.  CARDIOVASCULAR: No chest pain, orthopnea, edema.  GASTROINTESTINAL: No nausea, vomiting, diarrhea or abdominal pain.  GENITOURINARY: No dysuria, hematuria.  ENDOCRINE: No polyuria, nocturia,  HEMATOLOGY: No anemia, easy bruising or bleeding SKIN: No rash or lesion. MUSCULOSKELETAL: No joint pain or arthritis.   NEUROLOGIC: No tingling, numbness, weakness.  PSYCHIATRY: No anxiety or depression.   MEDICATIONS AT HOME:  Prior to Admission medications   Medication Sig Start Date End Date Taking? Authorizing Provider  amLODipine (NORVASC) 10 MG tablet Take 1 tablet by mouth daily. 12/17/12   [provider]   aspirin 81 MG tablet Take 81 mg by mouth daily.    [provider]  atorvastatin (LIPITOR) 20 MG tablet Take 1 tablet by mouth daily. 12/18/12   [provider]  baclofen (LIORESAL) 10 MG tablet Take 1 tablet by mouth 3 (three) times daily. 12/18/12   [provider]  benazepril (LOTENSIN) 20 MG tablet Take 1 tablet by mouth daily. 12/18/12   [provider]  butalbital-acetaminophen-caffeine [name], ESGIC) 29-937-16 MG tablet Take 1-2 tablets by mouth every 6 (six) hours as needed for headache. 02/16/16 02/15/17  Harvest Dark, MD  Calcium Carbonate-Vitamin D (CALCIUM + D PO) Take 2 tablets by mouth daily.    [provider]  carvedilol (COREG) 25 MG tablet Reported on 05/29/2015 05/25/15   [provider]  citalopram (CELEXA) 40 MG tablet Take 1 tablet (40 mg total) by mouth daily. 07/12/15   Rainey Pines, MD  clonazePAM (KLONOPIN) 0.5 MG tablet Take 0.5 tablets (0.25 mg total) by mouth 2 (two) times daily. 07/12/15   Rainey Pines, MD  cloNIDine (CATAPRES - DOSED IN MG/24 HR) 0.3 mg/24hr patch  05/25/15   [provider]  estradiol (ESTRACE) 0.1 MG/GM vaginal cream Place 1 Applicatorful vaginally at bedtime. Patient was given a sample of vaginal estrogen cream and instructed to apply 0.5mg  (pea-sized amount)  just inside the vaginal introitus with a finger-tip every night for two weeks and then Monday, Wednesday and Friday nights. 08/09/14   Zara Council A, PA-C  estrogens, conjugated, (PREMARIN) 0.625 MG tablet Take 0.625 mg by mouth. Reported on 07/03/2015    [provider]  furosemide (LASIX) 20 MG tablet Take 1 tablet by mouth 2 (two) times daily.  12/17/12   [provider]  glipiZIDE (GLUCOTROL) 5 MG tablet Take 1 tablet by mouth daily. 12/18/12   [provider]  insulin glargine (LANTUS) 100 UNIT/ML injection Inject 20 Units into the skin daily.    [provider]  mirabegron ER  (MYRBETRIQ) 25 MG TB24 tablet Take 1 tablet (25 mg total) by mouth daily. 07/03/15   Zara Council A, PA-C  MONOJECT INSULIN SYRINGE 31G X 5/16" 0.5 ML MISC  09/19/14   [provider]  omeprazole (PRILOSEC) 20 MG capsule Take 2 capsules by mouth daily. 11/25/12   [provider]  polyethylene glycol powder (GLYCOLAX/MIRALAX) powder 255 grams one bottle for colonoscopy prep 02/21/14   Robert Bellow, MD  PROAIR HFA 108 (90 BASE) MCG/ACT inhaler Inhale 2 puffs into the lungs every 8 (eight) hours. 10/22/12   [provider]  QUEtiapine (SEROQUEL) 100 MG tablet Take 1 tablet (100 mg total) by mouth daily. 07/12/15   Rainey Pines, MD  solifenacin (VESICARE) 10 MG tablet Take 1 tablet (  10 mg total) by mouth daily. 08/09/14   Zara Council A, PA-C      PHYSICAL EXAMINATION:   VITAL SIGNS: Blood pressure (!) 132/59, pulse 72, temperature 97.8 F (36.6 C), temperature source Oral, resp. rate (!) 26, height 5\' 1"  (1.549 m), weight 104.3 kg (230 lb), SpO2 95 %.  GENERAL:  66 y.o.-year-old patient lying in the bed with no acute distress.  EYES: Pupils equal, round, reactive to light and accommodation. No scleral icterus. Extraocular muscles intact.  HEENT: Head atraumatic, normocephalic. Oropharynx and nasopharynx clear.  Swollen tongue and lips noted. NECK:  Supple, no jugular venous distention. No thyroid enlargement, no tenderness.  LUNGS: Normal breath sounds bilaterally, no wheezing, rales,rhonchi or crepitation. No use of accessory muscles of respiration.  CARDIOVASCULAR: S1, S2 normal. No murmurs, rubs, or gallops.  ABDOMEN: Soft, nontender, nondistended. Bowel sounds present. No organomegaly or mass.  EXTREMITIES: No pedal edema, cyanosis, or clubbing.  NEUROLOGIC: Cranial nerves II through XII are intact. Muscle strength 5/5 in all extremities. Sensation intact. Gait not checked.  PSYCHIATRIC: The patient is alert and oriented x 3.  SKIN: No obvious rash, lesion,  or ulcer.   LABORATORY PANEL:   CBC No results for input(s): WBC, HGB, HCT, PLT, MCV, MCH, MCHC, RDW, LYMPHSABS, MONOABS, EOSABS, BASOSABS, BANDABS in the last 168 hours.  Invalid input(s): NEUTRABS, BANDSABD ------------------------------------------------------------------------------------------------------------------  Chemistries  No results for input(s): NA, K, CL, CO2, GLUCOSE, BUN, CREATININE, CALCIUM, MG, AST, ALT, ALKPHOS, BILITOT in the last 168 hours.  Invalid input(s): GFRCGP ------------------------------------------------------------------------------------------------------------------ CrCl cannot be calculated (Patient's most recent lab result is older than the maximum 21 days allowed.). ------------------------------------------------------------------------------------------------------------------ No results for input(s): TSH, T4TOTAL, T3FREE, THYROIDAB in the last 72 hours.  Invalid input(s): FREET3   Coagulation profile No results for input(s): INR, PROTIME in the last 168 hours. ------------------------------------------------------------------------------------------------------------------- No results for input(s): DDIMER in the last 72 hours. -------------------------------------------------------------------------------------------------------------------  Cardiac Enzymes No results for input(s): CKMB, TROPONINI, MYOGLOBIN in the last 168 hours.  Invalid input(s): CK ------------------------------------------------------------------------------------------------------------------ Invalid input(s): POCBNP  ---------------------------------------------------------------------------------------------------------------  Urinalysis    Component Value Date/Time   COLORURINE YELLOW (A) 05/05/2016 1544   APPEARANCEUR TURBID (A) 05/05/2016 1544   APPEARANCEUR Cloudy (A) 07/03/2015 1011   LABSPEC 1.011 05/05/2016 1544   LABSPEC 1.003 06/24/2014 1859    PHURINE 5.0 05/05/2016 1544   GLUCOSEU NEGATIVE 05/05/2016 1544   GLUCOSEU 50 mg/dL 06/24/2014 1859   HGBUR LARGE (A) 05/05/2016 1544   BILIRUBINUR NEGATIVE 05/05/2016 1544   BILIRUBINUR Negative 07/03/2015 1011   BILIRUBINUR Negative 06/24/2014 1859   KETONESUR NEGATIVE 05/05/2016 1544   PROTEINUR 100 (A) 05/05/2016 1544   UROBILINOGEN 0.2 04/04/2015 1546   NITRITE NEGATIVE 05/05/2016 1544   LEUKOCYTESUR LARGE (A) 05/05/2016 1544   LEUKOCYTESUR 3+ (A) 07/03/2015 1011   LEUKOCYTESUR 3+ 06/24/2014 1859     RADIOLOGY: No results found.  EKG: Orders placed or performed in visit on 04/24/14  . EKG 12-Lead    IMPRESSION AND PLAN: 66 year old female patient with history of congestive heart failure, diabetes mellitus,, chronic kidney disease, GERD, hypertension presented to the emergency room with swelling of the tongue and lips. Patient on ACE inhibitor for hypertension and chronic kidney disease. Admitting diagnosis 1. Angioedema secondary to ACE inhibitor 2. Chronic kidney disease 3.Congestive heart failure 4. Diabetes mellitus Treatment plan Admit patient to medical floor observation bed IV Solu-Medrol 60 mg every 6 hourly IV Benadryl when necessary for itching IV fluid hydration gently Nothing by mouth for now until the swelling  of the tongue results Discontinue ACE inhibitor Supportive care  All the records are reviewed and case discussed with ED provider. Management plans discussed with the patient, family and they are in agreement.  CODE STATUS:FULL CODE Code Status History    This patient does not have a recorded code status. Please follow your organizational policy for patients in this situation.       TOTAL TIME TAKING CARE OF THIS PATIENT: 50 minutes.    Saundra Shelling M.D on 07/26/2016 at 6:27 AM  Between 7am to 6pm - Pager - [phone]  After 6pm go to [email or web address] - password [email or web address]  [facility]  Office   [phone]  CC: Primary care physician; [employer]

## 2016-07-27 DIAGNOSIS — T783XXA Angioneurotic edema, initial encounter: Secondary | ICD-10-CM | POA: Diagnosis not present

## 2016-07-27 LAB — CBC
HEMATOCRIT: 39.9 % (ref 35.0–47.0)
HEMOGLOBIN: 13.3 g/dL (ref 12.0–16.0)
MCH: 29.2 pg (ref 26.0–34.0)
MCHC: 33.4 g/dL (ref 32.0–36.0)
MCV: 87.6 fL (ref 80.0–100.0)
Platelets: 273 10*3/uL (ref 150–440)
RBC: 4.55 MIL/uL (ref 3.80–5.20)
RDW: 13.4 % (ref 11.5–14.5)
WBC: 14.5 10*3/uL — ABNORMAL HIGH (ref 3.6–11.0)

## 2016-07-27 LAB — BPAM FFP
BLOOD PRODUCT EXPIRATION DATE: 201805302359
ISSUE DATE / TIME: 201805250439
Unit Type and Rh: 5100

## 2016-07-27 LAB — GLUCOSE, CAPILLARY
Glucose-Capillary: 278 mg/dL — ABNORMAL HIGH (ref 65–99)
Glucose-Capillary: 245 mg/dL — ABNORMAL HIGH (ref 65–99)

## 2016-07-27 LAB — BASIC METABOLIC PANEL
ANION GAP: 8 (ref 5–15)
BUN: 23 mg/dL — AB (ref 6–20)
CO2: 22 mmol/L (ref 22–32)
Calcium: 9.1 mg/dL (ref 8.9–10.3)
Chloride: 106 mmol/L (ref 101–111)
Creatinine, Ser: 1.32 mg/dL — ABNORMAL HIGH (ref 0.44–1.00)
GFR calc Af Amer: 48 mL/min — ABNORMAL LOW (ref >60)
GFR calc non Af Amer: 41 mL/min — ABNORMAL LOW (ref >60)
GLUCOSE: 250 mg/dL — AB (ref 65–99)
POTASSIUM: 3.8 mmol/L (ref 3.5–5.1)
Sodium: 136 mmol/L (ref 135–145)

## 2016-07-27 LAB — PREPARE FRESH FROZEN PLASMA: UNIT DIVISION: 0

## 2016-07-27 MED ORDER — HYDRALAZINE HCL 25 MG PO TABS: 25.0000 mg | ORAL_TABLET | Freq: Three times a day (TID) | ORAL | 0 refills | Status: DC

## 2016-07-27 MED ORDER — PREDNISONE 20 MG PO TABS: 20.0000 mg | ORAL_TABLET | Freq: Every day | ORAL | 0 refills | Status: DC

## 2016-07-27 NOTE — Progress Notes (Signed)
Belongings returned to patient.

## 2016-07-27 NOTE — Care Management Obs Status (Signed)
[place] NOTIFICATION   Patient Details  Name: Kathryn Kramer MRN: [id number] Date of Birth: [birth year]   Medicare Observation Status Notification Given:  Yes [place] letter)    Mardene Speak, RN 07/27/2016, 9:36 AM

## 2016-07-27 NOTE — Progress Notes (Signed)
[facility] Consultation    ASSESSMENT/PLAN   Angioedema most likely secondary to ace inhibition. Significant tongue edema has resolved. Would perform prednisone taper, Benadryl as needed, H2 blockers, at this point stable for floor transfer. We'll sign off please reconsult if can be of any assistance  Chronic renal insufficiency. Stable  Congestive heart failure. No evidence of active failure at this time  Diabetes mellitus. Managed per hospitalist service  Hypertension. On amlodipine  INTAKE / OUTPUT:  Intake/Output Summary (Last 24 hours) at 07/27/16 0750 Last data filed at 07/27/16 0200  Gross per 24 hour  Intake           1720.5 ml  Output              400 ml  Net           1320.5 ml     Name: Kathryn Kramer MRN: [id number] DOB: 1950-12-24    ADMISSION DATE:  07/26/2016 CONSULTATION DATE:  07/26/2016  REFERRING MD :  Hospitalist Service  CHIEF COMPLAINT:  Tongue swelling   HISTORY OF PRESENT ILLNESS:   Kathryn Kramer is a very pleasant 66 year old African-American female with a past medical history remarkable for hypertension, congestive heart failure, chronic renal insufficiency, type 1 diabetes, gastroesophageal reflux disease, anxiety disorder, presented to the emergency department with complaints of swelling of the tongue. This started around 12:00 midnight and increased in size giving the patient a choking sensation. She presented to the emergency department was noted to have swollen tongue and lips. She has been taken an ACE inhibitor for some time now. She was given epi injection emergency department along with IV Solu-Medrol and Benadryl. Her swelling has improved some and she has been able to communicate. Her tongue is still significantly swollen and she will be monitored in the intensive care unit.  Last 24 Hours: Patient is to mold the last 24 hours, her tongue swelling has essentially resolved. No shortness of breath, no stridor, improved  posterior luminal airway caliber.  PAST MEDICAL HISTORY :  Past Medical History:  Diagnosis Date  . Acid reflux   . Anxiety   . Arrhythmia   . BP (high blood pressure) 01/25/2013  . CCF (congestive cardiac failure) ([place]) 08/04/2013  . CHF (congestive heart failure) ([place])   . Chronic kidney disease   . Constipation   . Cystocele, grade 2   . Diabetes mellitus type I ([place])   . Diabetes mellitus without complication ([place])   . Female incontinence   . Hypertension   . Recurrent UTI   . Stroke [facility]) 2001   Past Surgical History:  Procedure Laterality Date  . COLONOSCOPY  02/16/13  . elbow     pinch nerve  . HAND SURGERY Bilateral [id number]   carpel tunnel  . HIP SURGERY Right 1999  . MULTIPLE TOOTH EXTRACTIONS  2014  . TUBAL LIGATION  1985   Prior to Admission medications   Medication Sig Start Date End Date Taking? Authorizing Provider  amLODipine (NORVASC) 10 MG tablet Take 1 tablet by mouth daily. 12/17/12  Yes [provider]  aspirin 81 MG tablet Take 81 mg by mouth daily.   Yes [provider]  atorvastatin (LIPITOR) 20 MG tablet Take 1 tablet by mouth at bedtime.  12/18/12  Yes [provider]  baclofen (LIORESAL) 10 MG tablet Take 1 tablet by mouth 3 (three) times daily. 12/18/12  Yes [provider]  benazepril (LOTENSIN) 20 MG tablet Take 1 tablet by  mouth daily. 12/18/12  Yes [provider]  Calcium Carbonate-Vitamin D (CALCIUM + D PO) Take 2 tablets by mouth daily.   Yes [provider]  carvedilol (COREG) 25 MG tablet Take 25 mg by mouth 2 (two) times daily with a meal. Reported on 05/29/2015 05/25/15  Yes [provider]  citalopram (CELEXA) 40 MG tablet Take 1 tablet (40 mg total) by mouth daily. 07/12/15  Yes Kathryn Pines, MD  clonazePAM (KLONOPIN) 0.25 MG disintegrating tablet Take 0.25 mg by mouth 2 (two) times daily as needed for anxiety.   Yes [provider]  cloNIDine (CATAPRES) 0.2 MG  tablet Take 1 tablet by mouth 2 (two) times daily. 06/20/16  Yes [provider]  furosemide (LASIX) 20 MG tablet Take 1 tablet by mouth 2 (two) times daily.  12/17/12  Yes [provider]  glipiZIDE (GLUCOTROL) 5 MG tablet Take 5 mg by mouth 2 (two) times daily before a meal.  12/18/12  Yes [provider]  insulin glargine (LANTUS) 100 UNIT/ML injection Inject 20 Units into the skin daily.   Yes [provider]  mirabegron ER (MYRBETRIQ) 25 MG TB24 tablet Take 1 tablet (25 mg total) by mouth daily. 07/03/15  Yes Kramer, Kathryn Beach A, PA-C  omeprazole (PRILOSEC) 20 MG capsule Take 2 capsules by mouth daily. 11/25/12  Yes [provider]  PREMARIN vaginal cream Place 1 g vaginally once a week.  07/01/16  Yes [provider]  PROAIR HFA 108 (90 BASE) MCG/ACT inhaler Inhale 2 puffs into the lungs every 8 (eight) hours. 10/22/12  Yes [provider]  spironolactone (ALDACTONE) 25 MG tablet Take 0.5 tablets by mouth daily. 06/20/16  Yes [provider]  estradiol (ESTRACE) 0.1 MG/GM vaginal cream Place 1 Applicatorful vaginally at bedtime. Patient was given a sample of vaginal estrogen cream and instructed to apply 0.5mg  (pea-sized amount)  just inside the vaginal introitus with a finger-tip every night for two weeks and then Monday, Wednesday and Friday nights. Patient not taking: Reported on 07/26/2016 08/09/14   Kathryn Kramer A, PA-C  QUEtiapine (SEROQUEL) 100 MG tablet Take 1 tablet (100 mg total) by mouth daily. Patient not taking: Reported on 07/26/2016 07/12/15   Kathryn Pines, MD  solifenacin (VESICARE) 10 MG tablet Take 1 tablet (10 mg total) by mouth daily. Patient not taking: Reported on 07/26/2016 08/09/14   Kathryn Kramer A, PA-C   Allergies  Allergen Reactions  . Ace Inhibitors Swelling  . Pravastatin Itching    Per patient "itching on the bottom of feet"    FAMILY HISTORY:  Family History  Problem Relation Age of Onset  .  Colon cancer Father   . Colon polyps Brother   . Colon cancer Brother   . Diabetes Mother   . Alcohol abuse Mother   . Heart disease Sister   . Breast cancer Sister   . Alcohol abuse Brother   . Drug abuse Brother   . Alcohol abuse Brother   . Drug abuse Brother   . Alcohol abuse Brother   . Drug abuse Brother   . Alcohol abuse Brother   . Drug abuse Brother   . Alcohol abuse Brother   . Drug abuse Brother   . Alcohol abuse Brother   . Drug abuse Brother   . Colon polyps Daughter   . Diabetes Daughter   . Kidney disease Neg Hx   . Bladder Cancer Neg Hx   . Ovarian cancer Neg Hx    SOCIAL  HISTORY:  reports that she has been smoking Cigarettes.  She has a 10.00 pack-year smoking history. She has never used smokeless tobacco. She reports that she does not drink alcohol or use drugs.  REVIEW OF SYSTEMS:   Constitutional: Feels well. Cardiovascular: No chest pain.  Pulmonary: Denies dyspnea.   The remainder of systems were reviewed and were found to be negative other than what is documented in the HPI.    VITAL SIGNS: Temp:  [97.5 F (36.4 C)-98.4 F (36.9 C)] 98.4 F (36.9 C) (05/26 0200) Pulse Rate:  [36-86] 76 (05/26 0200) Resp:  [16-42] 24 (05/26 0200) BP: (115-174)/(45-156) 134/78 (05/26 0200) SpO2:  [87 %-100 %] 91 % (05/26 0200) Weight:  [102.7 kg (226 lb 6.6 oz)] 102.7 kg (226 lb 6.6 oz) (05/25 1240) HEMODYNAMICS:     INTAKE / OUTPUT:  Intake/Output Summary (Last 24 hours) at 07/27/16 0750 Last data filed at 07/27/16 0200  Gross per 24 hour  Intake           1720.5 ml  Output              400 ml  Net           1320.5 ml    Physical Examination:   VS: BP 134/78 (BP Location: Right Arm)   Pulse 76   Temp 98.4 F (36.9 C) (Oral)   Resp (!) 24   Ht 5\' 1"  (1.549 m)   Wt 102.7 kg (226 lb 6.6 oz)   SpO2 91%   BMI 42.78 kg/m   General Appearance: No distress  Neuro:without focal findings, mental status, speech normal,. HEENT: Tongue and lip swelling  Significantly improved  Pulmonary: normal breath sounds., diaphragmatic excursion normal. CardiovascularNormal S1,S2.  No m/r/g.    Abdomen: Benign, Soft, non-tender, No masses, hepatosplenomegaly, No lymphadenopathy Endoc: No evident thyromegaly, no signs of acromegaly. Skin:   warm, no rashes, no ecchymosis  Extremities: normal, no cyanosis, clubbing, no edema, warm with normal capillary refill.    LABS: Reviewed   LABORATORY PANEL:   CBC  Recent Labs Lab 07/27/16 0235  WBC 14.5*  HGB 13.3  HCT 39.9  PLT 273    Chemistries   Recent Labs Lab 07/27/16 0235  NA 136  K 3.8  CL 106  CO2 22  GLUCOSE 250*  BUN 23*  CREATININE 1.32*  CALCIUM 9.1     Recent Labs Lab 07/26/16 1202 07/26/16 1638 07/26/16 2001 07/26/16 2343 07/27/16 0354 07/27/16 0736  GLUCAP 221* 246* 181* 175* 278* 245*   No results for input(s): PHART, PCO2ART, PO2ART in the last 168 hours. No results for input(s): AST, ALT, ALKPHOS, BILITOT, ALBUMIN in the last 168 hours.  Cardiac Enzymes No results for input(s): TROPONINI in the last 168 hours.  RADIOLOGY:  No results found.    Hermelinda Dellen, DO ICU Pager [phone] [place] Office Number: [phone]  Patricia Pesa, M.D.  Merton Border, M.D   07/27/2016, 7:50 AM

## 2016-07-27 NOTE — Progress Notes (Signed)
[facility] [place] [facility] Consultation    SYNOPSIS   Kathryn Kramer is a very pleasant 66 year old African-American female with a past medical history remarkable for hypertension, congestive heart failure, chronic renal insufficiency, type 1 diabetes, gastroesophageal reflux disease, anxiety disorder, presented to the emergency department with complaints of swelling of the tongue. This started around 12:00 midnight and increased in size giving the patient a choking sensation. She presented to the emergency department was noted to have swollen tongue and lips. She has been taken an ACE inhibitor for some time now. She was given epi injection emergency department along with IV Solu-Medrol and Benadryl. Her swelling has improved some and she has been able to communicate. Her tongue is still significantly swollen and she will be monitored in the intensive care unit.  ASSESSMENT/PLAN   Angioedema most likely secondary to ace inhibition. She is presently maintaining her airway, agree with Solu-Medrol, Benadryl, H2 blocker, will follow in the ICU to make sure airway stabilizes.  Chronic renal insufficiency. Stable  Congestive heart failure. No evidence of active failure at this time  Diabetes mellitus. Managed per hospitalist service  Hypertension. On amlodipine  Symptoms have resolved. Stable for transfer out of the ICU  INTAKE / OUTPUT:  Intake/Output Summary (Last 24 hours) at 07/27/16 0752 Last data filed at 07/27/16 0200  Gross per 24 hour  Intake           1720.5 ml  Output              400 ml  Net           1320.5 ml     Name: Kathryn Kramer MRN: [id number] DOB: [birth year]    ADMISSION DATE:  07/26/2016 CONSULTATION DATE:  07/26/2016  REFERRING MD :  Hospitalist Service  CHIEF COMPLAINT:  Tongue swelling   HISTORY OF PRESENT ILLNESS:   Kathryn Kramer is a very pleasant 66 year old African-American female with a past medical history remarkable for hypertension, congestive  heart failure, chronic renal insufficiency, type 1 diabetes, gastroesophageal reflux disease, anxiety disorder, presented to the emergency department with complaints of swelling of the tongue. This started around 12:00 midnight and increased in size giving the patient a choking sensation. She presented to the emergency department was noted to have swollen tongue and lips. She has been taken an ACE inhibitor for some time now. She was given epi injection emergency department along with IV Solu-Medrol and Benadryl. Her swelling has improved some and she has been able to communicate. Her tongue is still significantly swollen and she will be monitored in the intensive care unit.  SUBJECTIVE: No acute issues overnight. Symptoms have completely resolved. Patient is eating and drinking without difficulty  VITAL SIGNS: Temp:  [97.5 F (36.4 C)-98.4 F (36.9 C)] 98.4 F (36.9 C) (05/26 0200) Pulse Rate:  [36-86] 76 (05/26 0200) Resp:  [16-42] 24 (05/26 0200) BP: (115-174)/(45-156) 134/78 (05/26 0200) SpO2:  [87 %-100 %] 91 % (05/26 0200) Weight:  [226 lb 6.6 oz (102.7 kg)] 226 lb 6.6 oz (102.7 kg) (05/25 1240) HEMODYNAMICS:     INTAKE / OUTPUT:  Intake/Output Summary (Last 24 hours) at 07/27/16 0752 Last data filed at 07/27/16 0200  Gross per 24 hour  Intake           1720.5 ml  Output              400 ml  Net           1320.5 ml  Physical Examination:   VS: BP 134/78 (BP Location: Right Arm)   Pulse 76   Temp 98.4 F (36.9 C) (Oral)   Resp (!) 24   Ht 5\' 1"  (1.549 m)   Wt 226 lb 6.6 oz (102.7 kg)   SpO2 91%   BMI 42.78 kg/m   General Appearance: No distress  Neuro:without focal findings, mental status, speech normal,. HEENT: PERRLA, tongue midline, swelling resolved, no stridor Pulmonary: normal breath sounds., diaphragmatic excursion normal. CardiovascularNormal S1,S2.  No m/r/g.    Abdomen: Benign, Soft, non-tender, No masses, hepatosplenomegaly, No lymphadenopathy Endoc: No  evident thyromegaly, no signs of acromegaly. Skin:   warm, no rashes, no ecchymosis  Extremities: normal, no cyanosis, clubbing, no edema, warm with normal capillary refill.   LABS: Reviewed   LABORATORY PANEL:   CBC  Recent Labs Lab 07/27/16 0235  WBC 14.5*  HGB 13.3  HCT 39.9  PLT 273    Chemistries   Recent Labs Lab 07/27/16 0235  NA 136  K 3.8  CL 106  CO2 22  GLUCOSE 250*  BUN 23*  CREATININE 1.32*  CALCIUM 9.1     Recent Labs Lab 07/26/16 1202 07/26/16 1638 07/26/16 2001 07/26/16 2343 07/27/16 0354 07/27/16 0736  GLUCAP 221* 246* 181* 175* 278* 245*   No results for input(s): PHART, PCO2ART, PO2ART in the last 168 hours. No results for input(s): AST, ALT, ALKPHOS, BILITOT, ALBUMIN in the last 168 hours.  Cardiac Enzymes No results for input(s): TROPONINI in the last 168 hours.  RADIOLOGY:  No results found.   Yashica Sterbenz S. [facility] ANP-BC Pulmonary and Critical Care Medicine [facility] [place] Pager [phone] or [phone]  07/27/2016, 7:52 AM

## 2016-07-27 NOTE — Progress Notes (Signed)
Discharge instructions/education reviewed with patient. Patient understands and provided teach back her new medications and when she needs to take them. Patient is calling daughter for transport home.

## 2016-07-27 NOTE — Progress Notes (Signed)
Patient discharged home with daughter

## 2016-07-27 NOTE — Progress Notes (Signed)
Patient alert and able to make needs known. Swelling of face/tongue/neck appears to have gone down. Patient is able to speak, stick out tongue midline, smile, swallow pills and was also able to have crackers, etc. Placed on a diabetic diet. Denies pain, safety maintained.

## 2016-07-30 NOTE — Discharge Summary (Signed)
Kathryn Kramer at [place] NAME: Kathryn Kramer    MR#:  [id number]  DATE OF BIRTH:  [birth year]  DATE OF ADMISSION:  07/26/2016 ADMITTING PHYSICIAN: Henreitta Leber, MD  DATE OF DISCHARGE: 07/27/2016 11:06 AM  PRIMARY CARE PHYSICIAN: [facility] [place]   ADMISSION DIAGNOSIS:  Angioedema, initial encounter [T78.3XXA] Angioedema [T78.3XXA]  DISCHARGE DIAGNOSIS:  Active Problems:   Angioedema   SECONDARY DIAGNOSIS:   Past Medical History:  Diagnosis Date  . Acid reflux   . Anxiety   . Arrhythmia   . BP (high blood pressure) 01/25/2013  . CCF (congestive cardiac failure) ([place]) 08/04/2013  . CHF (congestive heart failure) ([place])   . Chronic kidney disease   . Constipation   . Cystocele, grade 2   . Diabetes mellitus type I ([place])   . Diabetes mellitus without complication ([place])   . Female incontinence   . Hypertension   . Recurrent UTI   . Stroke [facility]) 2001     ADMITTING HISTORY  HISTORY OF PRESENT ILLNESS: Kathryn Kramer  is a 66 y.o. female with a known history of GERD, anxiety disorder, hypertension, congestive heart failure, chronic kidney disease, constipation, type 1 diabetes mellitus presented to the emergency room with swelling of the tongue. The swelling of the tongue started around 12 midnight. It increases in size and patient had a choking sensation. She presented to the emergency room with swollen tongue and swollen lips. Patient has been taking ACE inhibitor for a long time. She was given epi injection in the emergency room and was given IV Solu-Medrol and fresh frozen plasma and benadryl IV. Her swelling little bit improved and the patient was able to communicate and speak better. No difficulty swallowing. No shortness of breath, orthopnea. Hospitalist service was consulted for further care of the patient.  HOSPITAL COURSE:   * Angioedema likely due to Benazapril. Patient had significant swelling of her tongue. Did not have  any stridor or trouble swallowing. She was monitored and stepdown unit. Seen by pulmonary. By the day of discharge her swelling has resolved.  Benazepril is being replaced by hydralazine. Other blood pressure medications remain the same.  Prescriptions given for prednisone.  Other comorbidities remain unchanged. Discharge home to follow-up with primary care physician in 1 week for further change in blood pressure medications.  CONSULTS OBTAINED:    DRUG ALLERGIES:   Allergies  Allergen Reactions  . Ace Inhibitors Swelling  . Pravastatin Itching    Per patient "itching on the bottom of feet"    DISCHARGE MEDICATIONS:   Discharge Medication List as of 07/27/2016 10:26 AM    START taking these medications   Details  hydrALAZINE (APRESOLINE) 25 MG tablet Take 1 tablet (25 mg total) by mouth 3 (three) times daily., Starting Sat 07/27/2016, Until Sun 07/27/2017, Normal    predniSONE (DELTASONE) 20 MG tablet Take 1 tablet (20 mg total) by mouth daily with breakfast., Starting Sun 07/28/2016, Normal      CONTINUE these medications which have NOT CHANGED   Details  amLODipine (NORVASC) 10 MG tablet Take 1 tablet by mouth daily., Starting Thu 12/17/2012, Historical Med    aspirin 81 MG tablet Take 81 mg by mouth daily., Historical Med    atorvastatin (LIPITOR) 20 MG tablet Take 1 tablet by mouth at bedtime. , Starting Fri 12/18/2012, Historical Med    baclofen (LIORESAL) 10 MG tablet Take 1 tablet by mouth 3 (three) times daily., Starting Fri 12/18/2012, Historical Med  Calcium Carbonate-Vitamin D (CALCIUM + D PO) Take 2 tablets by mouth daily., Historical Med    carvedilol (COREG) 25 MG tablet Take 25 mg by mouth 2 (two) times daily with a meal. Reported on 05/29/2015, Starting Thu 05/25/2015, Historical Med    citalopram (CELEXA) 40 MG tablet Take 1 tablet (40 mg total) by mouth daily., Starting Wed 07/12/2015, Print    clonazePAM (KLONOPIN) 0.25 MG disintegrating tablet Take 0.25  mg by mouth 2 (two) times daily as needed for anxiety., Historical Med    cloNIDine (CATAPRES) 0.2 MG tablet Take 1 tablet by mouth 2 (two) times daily., Starting Thu 06/20/2016, Historical Med    furosemide (LASIX) 20 MG tablet Take 1 tablet by mouth 2 (two) times daily. , Starting Thu 12/17/2012, Historical Med    glipiZIDE (GLUCOTROL) 5 MG tablet Take 5 mg by mouth 2 (two) times daily before a meal. , Starting Fri 12/18/2012, Historical Med    insulin glargine (LANTUS) 100 UNIT/ML injection Inject 20 Units into the skin daily., Historical Med    mirabegron ER (MYRBETRIQ) 25 MG TB24 tablet Take 1 tablet (25 mg total) by mouth daily., Starting Mon 07/03/2015, Print    omeprazole (PRILOSEC) 20 MG capsule Take 2 capsules by mouth daily., Starting Wed 11/25/2012, Historical Med    PREMARIN vaginal cream Place 1 g vaginally once a week. , Starting Mon 07/01/2016, Historical Med    PROAIR HFA 108 (90 BASE) MCG/ACT inhaler Inhale 2 puffs into the lungs every 8 (eight) hours., Starting Thu 10/22/2012, Historical Med    spironolactone (ALDACTONE) 25 MG tablet Take 0.5 tablets by mouth daily., Starting Thu 06/20/2016, Historical Med    estradiol (ESTRACE) 0.1 MG/GM vaginal cream Place 1 Applicatorful vaginally at bedtime. Patient was given a sample of vaginal estrogen cream and instructed to apply 0.5mg  (pea-sized amount)  just inside the vaginal introitus with a finger-tip every night for two weeks and then Monday, Wednesday an d Friday nights., Starting Tue 08/09/2014, Print    QUEtiapine (SEROQUEL) 100 MG tablet Take 1 tablet (100 mg total) by mouth daily., Starting Wed 07/12/2015, Print    solifenacin (VESICARE) 10 MG tablet Take 1 tablet (10 mg total) by mouth daily., Starting Tue 08/09/2014, Print      STOP taking these medications     benazepril (LOTENSIN) 20 MG tablet      clonazePAM (KLONOPIN) 0.5 MG tablet         Today   VITAL SIGNS:  Blood pressure (!) 182/84, pulse 74, temperature  97.8 F (36.6 C), temperature source Oral, resp. rate (!) 24, height 5\' 1"  (1.549 m), weight 102.7 kg (226 lb 6.6 oz), SpO2 95 %.  I/O:  No intake or output data in the 24 hours ending 07/30/16 1102  PHYSICAL EXAMINATION:  Physical Exam  GENERAL:  66 y.o.-year-old patient lying in the bed with no acute distress.  LUNGS: Normal breath sounds bilaterally, no wheezing, rales,rhonchi or crepitation. No use of accessory muscles of respiration.  CARDIOVASCULAR: S1, S2 normal. No murmurs, rubs, or gallops.  ABDOMEN: Soft, non-tender, non-distended. Bowel sounds present. No organomegaly or mass.  NEUROLOGIC: Moves all 4 extremities. PSYCHIATRIC: The patient is alert and oriented x 3.  SKIN: No obvious rash, lesion, or ulcer.   DATA REVIEW:   CBC  Recent Labs Lab 07/27/16 0235  WBC 14.5*  HGB 13.3  HCT 39.9  PLT 273    Chemistries   Recent Labs Lab 07/27/16 0235  NA 136  K 3.8  CL  106  CO2 22  GLUCOSE 250*  BUN 23*  CREATININE 1.32*  CALCIUM 9.1    Cardiac Enzymes No results for input(s): TROPONINI in the last 168 hours.  Microbiology Results  Results for orders placed or performed during the hospital encounter of 07/26/16  MRSA PCR Screening     Status: None   Collection Time: 07/26/16  2:02 AM  Result Value Ref Range Status   MRSA by PCR NEGATIVE NEGATIVE Final    Comment:        The GeneXpert MRSA Assay (FDA approved for NASAL specimens only), is one component of a comprehensive MRSA colonization surveillance program. It is not intended to diagnose MRSA infection nor to guide or monitor treatment for MRSA infections.     RADIOLOGY:  No results found.  Follow up with PCP in 1 week.  Management plans discussed with the patient, family and they are in agreement.  CODE STATUS:  Code Status History    Date Active Date Inactive Code Status Order ID Comments User Context   07/26/2016 12:38 PM 07/27/2016  2:12 PM Full Code [id number]  Saundra Shelling, MD  Inpatient      TOTAL TIME TAKING CARE OF THIS PATIENT ON DAY OF DISCHARGE: more than 30 minutes.   Hillary Bow R M.D on 07/30/2016 at 11:02 AM  Between 7am to 6pm - Pager - [phone]  After 6pm go to [email or web address] - password [email or web address] [facility]  Office  [phone]  CC: Primary care physician; [employer]  Note: This dictation was prepared with Diplomatic Services operational officer dictation along with smaller Company secretary. Any transcriptional errors that result from this process are unintentional.

## 2016-08-19 DIAGNOSIS — R519 Headache, unspecified: Secondary | ICD-10-CM | POA: Insufficient documentation

## 2016-08-22 ENCOUNTER — Other Ambulatory Visit: Payer: Self-pay | Admitting: Family Medicine

## 2016-08-22 DIAGNOSIS — Z8679 Personal history of other diseases of the circulatory system: Secondary | ICD-10-CM

## 2016-08-27 ENCOUNTER — Ambulatory Visit
Admission: RE | Admit: 2016-08-27 | Discharge: 2016-08-27 | Disposition: A | Discharge status: Home / Self Care | Payer: Medicare Other | Source: Ambulatory Visit | Attending: Family Medicine | Admitting: Family Medicine

## 2016-08-27 DIAGNOSIS — Z8679 Personal history of other diseases of the circulatory system: Secondary | ICD-10-CM | POA: Diagnosis present

## 2016-08-27 DIAGNOSIS — I517 Cardiomegaly: Secondary | ICD-10-CM | POA: Diagnosis not present

## 2016-08-27 NOTE — Progress Notes (Signed)
*  PRELIMINARY RESULTS* Echocardiogram 2D Echocardiogram has been performed.  Kathryn Kramer 08/27/2016, 10:38 AM

## 2017-03-19 ENCOUNTER — Inpatient Hospital Stay
Admission: EM | Admit: 2017-03-19 | Discharge: 2017-03-20 | DRG: 308 | Disposition: A | Discharge status: Home / Self Care | Payer: Medicare Other | Attending: Specialist | Admitting: Specialist

## 2017-03-19 ENCOUNTER — Encounter: Payer: Self-pay | Admitting: *Deleted

## 2017-03-19 ENCOUNTER — Emergency Department: Payer: Medicare Other

## 2017-03-19 ENCOUNTER — Other Ambulatory Visit: Payer: Self-pay

## 2017-03-19 DIAGNOSIS — R001 Bradycardia, unspecified: Principal | ICD-10-CM | POA: Diagnosis present

## 2017-03-19 DIAGNOSIS — F329 Major depressive disorder, single episode, unspecified: Secondary | ICD-10-CM | POA: Diagnosis present

## 2017-03-19 DIAGNOSIS — Z888 Allergy status to other drugs, medicaments and biological substances status: Secondary | ICD-10-CM | POA: Diagnosis not present

## 2017-03-19 DIAGNOSIS — E782 Mixed hyperlipidemia: Secondary | ICD-10-CM | POA: Diagnosis not present

## 2017-03-19 DIAGNOSIS — E785 Hyperlipidemia, unspecified: Secondary | ICD-10-CM | POA: Diagnosis present

## 2017-03-19 DIAGNOSIS — Z8673 Personal history of transient ischemic attack (TIA), and cerebral infarction without residual deficits: Secondary | ICD-10-CM

## 2017-03-19 DIAGNOSIS — N183 Chronic kidney disease, stage 3 (moderate): Secondary | ICD-10-CM | POA: Diagnosis not present

## 2017-03-19 DIAGNOSIS — F17210 Nicotine dependence, cigarettes, uncomplicated: Secondary | ICD-10-CM | POA: Diagnosis not present

## 2017-03-19 DIAGNOSIS — I130 Hypertensive heart and chronic kidney disease with heart failure and stage 1 through stage 4 chronic kidney disease, or unspecified chronic kidney disease: Secondary | ICD-10-CM | POA: Diagnosis present

## 2017-03-19 DIAGNOSIS — N179 Acute kidney failure, unspecified: Secondary | ICD-10-CM | POA: Diagnosis not present

## 2017-03-19 DIAGNOSIS — E1022 Type 1 diabetes mellitus with diabetic chronic kidney disease: Secondary | ICD-10-CM | POA: Diagnosis present

## 2017-03-19 DIAGNOSIS — F419 Anxiety disorder, unspecified: Secondary | ICD-10-CM | POA: Diagnosis present

## 2017-03-19 DIAGNOSIS — E119 Type 2 diabetes mellitus without complications: Secondary | ICD-10-CM

## 2017-03-19 DIAGNOSIS — Z79899 Other long term (current) drug therapy: Secondary | ICD-10-CM

## 2017-03-19 DIAGNOSIS — R609 Edema, unspecified: Secondary | ICD-10-CM

## 2017-03-19 DIAGNOSIS — Z7982 Long term (current) use of aspirin: Secondary | ICD-10-CM | POA: Diagnosis not present

## 2017-03-19 DIAGNOSIS — I493 Ventricular premature depolarization: Secondary | ICD-10-CM

## 2017-03-19 DIAGNOSIS — I5033 Acute on chronic diastolic (congestive) heart failure: Secondary | ICD-10-CM | POA: Diagnosis not present

## 2017-03-19 DIAGNOSIS — I700 Atherosclerosis of aorta: Secondary | ICD-10-CM | POA: Diagnosis not present

## 2017-03-19 DIAGNOSIS — Z794 Long term (current) use of insulin: Secondary | ICD-10-CM

## 2017-03-19 LAB — BASIC METABOLIC PANEL
ANION GAP: 10 (ref 5–15)
BUN: 24 mg/dL — AB (ref 6–20)
CALCIUM: 9.3 mg/dL (ref 8.9–10.3)
CO2: 22 mmol/L (ref 22–32)
Chloride: 105 mmol/L (ref 101–111)
Creatinine, Ser: 1.81 mg/dL — ABNORMAL HIGH (ref 0.44–1.00)
GFR calc Af Amer: 32 mL/min — ABNORMAL LOW (ref >60)
GFR calc non Af Amer: 28 mL/min — ABNORMAL LOW (ref >60)
GLUCOSE: 134 mg/dL — AB (ref 65–99)
Potassium: 4.4 mmol/L (ref 3.5–5.1)
Sodium: 137 mmol/L (ref 135–145)

## 2017-03-19 LAB — GLUCOSE, CAPILLARY: Glucose-Capillary: 175 mg/dL — ABNORMAL HIGH (ref 65–99)

## 2017-03-19 LAB — CBC
HCT: 39.5 % (ref 35.0–47.0)
HEMOGLOBIN: 13.3 g/dL (ref 12.0–16.0)
MCH: 29.6 pg (ref 26.0–34.0)
MCHC: 33.7 g/dL (ref 32.0–36.0)
MCV: 87.6 fL (ref 80.0–100.0)
Platelets: 259 10*3/uL (ref 150–440)
RBC: 4.51 MIL/uL (ref 3.80–5.20)
RDW: 14.4 % (ref 11.5–14.5)
WBC: 11.7 10*3/uL — ABNORMAL HIGH (ref 3.6–11.0)

## 2017-03-19 LAB — TROPONIN I

## 2017-03-19 LAB — MAGNESIUM: MAGNESIUM: 1.6 mg/dL — AB (ref 1.7–2.4)

## 2017-03-19 MED ORDER — GLIPIZIDE 5 MG PO TABS
5.0000 mg | ORAL_TABLET | Freq: Two times a day (BID) | ORAL | Status: DC
  Administered 2017-03-20: 5 mg via ORAL
  Filled 2017-03-19 (×2): qty 1

## 2017-03-19 MED ORDER — FUROSEMIDE 20 MG PO TABS
20.0000 mg | ORAL_TABLET | Freq: Two times a day (BID) | ORAL | Status: DC
  Administered 2017-03-19 – 2017-03-20 (×2): 20 mg via ORAL
  Filled 2017-03-19 (×2): qty 1

## 2017-03-19 MED ORDER — INSULIN ASPART 100 UNIT/ML ~~LOC~~ SOLN: 0.0000 [IU] | Freq: Every day | SUBCUTANEOUS | Status: DC

## 2017-03-19 MED ORDER — ACETAMINOPHEN 325 MG PO TABS: 650.0000 mg | ORAL_TABLET | Freq: Four times a day (QID) | ORAL | Status: DC | PRN

## 2017-03-19 MED ORDER — CLONAZEPAM 0.25 MG PO TBDP: 0.2500 mg | ORAL_TABLET | Freq: Two times a day (BID) | ORAL | Status: DC | PRN

## 2017-03-19 MED ORDER — ONDANSETRON HCL 4 MG PO TABS: 4.0000 mg | ORAL_TABLET | Freq: Four times a day (QID) | ORAL | Status: DC | PRN

## 2017-03-19 MED ORDER — ENOXAPARIN SODIUM 40 MG/0.4ML ~~LOC~~ SOLN
40.0000 mg | Freq: Two times a day (BID) | SUBCUTANEOUS | Status: DC
  Administered 2017-03-19 – 2017-03-20 (×2): 40 mg via SUBCUTANEOUS
  Filled 2017-03-19 (×2): qty 0.4

## 2017-03-19 MED ORDER — SPIRONOLACTONE 25 MG PO TABS
12.5000 mg | ORAL_TABLET | Freq: Every day | ORAL | Status: DC
  Administered 2017-03-20: 12.5 mg via ORAL
  Filled 2017-03-19: qty 0.5
  Filled 2017-03-19: qty 1

## 2017-03-19 MED ORDER — HYDRALAZINE HCL 25 MG PO TABS
25.0000 mg | ORAL_TABLET | Freq: Three times a day (TID) | ORAL | Status: DC
  Administered 2017-03-19 – 2017-03-20 (×2): 25 mg via ORAL
  Filled 2017-03-19 (×2): qty 1

## 2017-03-19 MED ORDER — SENNOSIDES-DOCUSATE SODIUM 8.6-50 MG PO TABS: 1.0000 | ORAL_TABLET | Freq: Every evening | ORAL | Status: DC | PRN

## 2017-03-19 MED ORDER — ESTRADIOL 0.1 MG/GM VA CREA
1.0000 | TOPICAL_CREAM | Freq: Every day | VAGINAL | Status: DC
  Filled 2017-03-19: qty 42.5

## 2017-03-19 MED ORDER — ACETAMINOPHEN 650 MG RE SUPP: 650.0000 mg | Freq: Four times a day (QID) | RECTAL | Status: DC | PRN

## 2017-03-19 MED ORDER — INSULIN ASPART 100 UNIT/ML ~~LOC~~ SOLN: 0.0000 [IU] | Freq: Three times a day (TID) | SUBCUTANEOUS | Status: DC

## 2017-03-19 MED ORDER — AMLODIPINE BESYLATE 10 MG PO TABS
10.0000 mg | ORAL_TABLET | Freq: Every day | ORAL | Status: DC
  Administered 2017-03-20: 10 mg via ORAL
  Filled 2017-03-19: qty 1

## 2017-03-19 MED ORDER — PREDNISONE 20 MG PO TABS
20.0000 mg | ORAL_TABLET | Freq: Every day | ORAL | Status: DC
  Administered 2017-03-20: 20 mg via ORAL
  Filled 2017-03-19: qty 1

## 2017-03-19 MED ORDER — ASPIRIN EC 81 MG PO TBEC
81.0000 mg | DELAYED_RELEASE_TABLET | Freq: Every day | ORAL | Status: DC
  Administered 2017-03-20: 81 mg via ORAL
  Filled 2017-03-19: qty 1

## 2017-03-19 MED ORDER — CLONIDINE HCL 0.1 MG PO TABS
0.2000 mg | ORAL_TABLET | Freq: Two times a day (BID) | ORAL | Status: DC
  Administered 2017-03-19 – 2017-03-20 (×2): 0.2 mg via ORAL
  Filled 2017-03-19 (×3): qty 2

## 2017-03-19 MED ORDER — ATORVASTATIN CALCIUM 20 MG PO TABS: 20.0000 mg | ORAL_TABLET | Freq: Every day | ORAL | Status: DC

## 2017-03-19 MED ORDER — QUETIAPINE FUMARATE 25 MG PO TABS
100.0000 mg | ORAL_TABLET | Freq: Every day | ORAL | Status: DC
  Administered 2017-03-19 – 2017-03-20 (×2): 100 mg via ORAL
  Filled 2017-03-19 (×2): qty 4

## 2017-03-19 MED ORDER — DARIFENACIN HYDROBROMIDE ER 7.5 MG PO TB24
7.5000 mg | ORAL_TABLET | Freq: Every day | ORAL | Status: DC
  Administered 2017-03-20: 7.5 mg via ORAL
  Filled 2017-03-19: qty 1

## 2017-03-19 MED ORDER — ESTROGENS, CONJUGATED 0.625 MG/GM VA CREA
1.0000 g | TOPICAL_CREAM | VAGINAL | Status: DC
  Filled 2017-03-19: qty 30

## 2017-03-19 MED ORDER — PANTOPRAZOLE SODIUM 40 MG PO TBEC
40.0000 mg | DELAYED_RELEASE_TABLET | Freq: Every day | ORAL | Status: DC
  Administered 2017-03-20: 40 mg via ORAL
  Filled 2017-03-19: qty 1

## 2017-03-19 MED ORDER — ONDANSETRON HCL 4 MG/2ML IJ SOLN: 4.0000 mg | Freq: Four times a day (QID) | INTRAMUSCULAR | Status: DC | PRN

## 2017-03-19 MED ORDER — BACLOFEN 10 MG PO TABS
10.0000 mg | ORAL_TABLET | Freq: Three times a day (TID) | ORAL | Status: DC
  Administered 2017-03-19 – 2017-03-20 (×2): 10 mg via ORAL
  Filled 2017-03-19 (×4): qty 1

## 2017-03-19 MED ORDER — INSULIN GLARGINE 100 UNIT/ML ~~LOC~~ SOLN
20.0000 [IU] | Freq: Every day | SUBCUTANEOUS | Status: DC
  Administered 2017-03-20: 20 [IU] via SUBCUTANEOUS
  Filled 2017-03-19: qty 0.2

## 2017-03-19 MED ORDER — CITALOPRAM HYDROBROMIDE 20 MG PO TABS
40.0000 mg | ORAL_TABLET | Freq: Every day | ORAL | Status: DC
  Administered 2017-03-20: 40 mg via ORAL
  Filled 2017-03-19: qty 2

## 2017-03-19 MED ORDER — INSULIN ASPART 100 UNIT/ML ~~LOC~~ SOLN
0.0000 [IU] | Freq: Three times a day (TID) | SUBCUTANEOUS | Status: DC
  Administered 2017-03-20: 3 [IU] via SUBCUTANEOUS
  Filled 2017-03-19: qty 1

## 2017-03-19 MED ORDER — MIRABEGRON ER 25 MG PO TB24
25.0000 mg | ORAL_TABLET | Freq: Every day | ORAL | Status: DC
  Administered 2017-03-20: 25 mg via ORAL
  Filled 2017-03-19: qty 1

## 2017-03-19 MED ORDER — ALBUTEROL SULFATE (2.5 MG/3ML) 0.083% IN NEBU
2.5000 mg | INHALATION_SOLUTION | Freq: Three times a day (TID) | RESPIRATORY_TRACT | Status: DC
Start: 2017-03-19 — End: 2017-03-20
  Administered 2017-03-19 – 2017-03-20 (×3): 2.5 mg via RESPIRATORY_TRACT
  Filled 2017-03-19 (×3): qty 3

## 2017-03-19 NOTE — H&P (Signed)
[facility] NAME: Kathryn Kramer    MR#:  [id number]  DATE OF BIRTH:  Nov 24, 1950  DATE OF ADMISSION:  03/19/2017  PRIMARY CARE PHYSICIAN: [facility] [place]   REQUESTING/REFERRING PHYSICIAN: dr Jacqualine Code  CHIEF COMPLAINT:   dizziness HISTORY OF PRESENT ILLNESS:  Kathryn Kramer  is a 66 y.o. female with a known history of diabetes and diastolic heart failure who presents from Dr. Lannie Fields office with bradycardia and diaphoresis. Patient reports over the past 2-3 weeks she has had episodes of lightheadedness, shortness of breath and diaphoresis. It was noted today that her heart rates were in the 30s. It appears that she is in bigeminy. Dr. Lorinda Creed has been contacted by ED physician. Heart rate is currently 70. Patient denies chest pain, dyspnea exertion, lower extremity edema, nausea, vomiting or diarrhea.  PAST MEDICAL HISTORY:   Past Medical History:  Diagnosis Date  . Acid reflux   . Anxiety   . Arrhythmia   . BP (high blood pressure) 01/25/2013  . CCF (congestive cardiac failure) ([place]) 08/04/2013  . CHF (congestive heart failure) ([place])   . Chronic kidney disease   . Constipation   . Cystocele, grade 2   . Diabetes mellitus type I ([place])   . Diabetes mellitus without complication ([place])   . Female incontinence   . Hypertension   . Recurrent UTI   . Stroke [facility]) 2001    PAST SURGICAL HISTORY:   Past Surgical History:  Procedure Laterality Date  . COLONOSCOPY  02/16/13  . elbow     pinch nerve  . HAND SURGERY Bilateral [id number]   carpel tunnel  . HIP SURGERY Right 1999  . MULTIPLE TOOTH EXTRACTIONS  2014  . TUBAL LIGATION  1985    SOCIAL HISTORY:   Social History   Tobacco Use  . Smoking status: Current Every Day Smoker    Packs/day: 0.50    Years: 20.00    Pack years: 10.00    Types: Cigarettes  . Smokeless tobacco: Never Used  Substance Use Topics  . Alcohol use: No    Alcohol/week: 0.0 oz    FAMILY  HISTORY:   Family History  Problem Relation Age of Onset  . Colon cancer Father   . Colon polyps Brother   . Colon cancer Brother   . Diabetes Mother   . Alcohol abuse Mother   . Heart disease Sister   . Breast cancer Sister   . Alcohol abuse Brother   . Drug abuse Brother   . Alcohol abuse Brother   . Drug abuse Brother   . Alcohol abuse Brother   . Drug abuse Brother   . Alcohol abuse Brother   . Drug abuse Brother   . Alcohol abuse Brother   . Drug abuse Brother   . Alcohol abuse Brother   . Drug abuse Brother   . Colon polyps Daughter   . Diabetes Daughter   . Kidney disease Neg Hx   . Bladder Cancer Neg Hx   . Ovarian cancer Neg Hx     DRUG ALLERGIES:   Allergies  Allergen Reactions  . Ace Inhibitors Swelling  . Pravastatin Itching    Per patient "itching on the bottom of feet"    REVIEW OF SYSTEMS:   Review of Systems  Constitutional: Positive for malaise/fatigue. Negative for chills and fever.  HENT: Negative.  Negative for ear discharge, ear pain, hearing loss, nosebleeds and sore throat.   Eyes:  Negative.  Negative for blurred vision and pain.  Respiratory: Negative.  Negative for cough, hemoptysis, shortness of breath and wheezing.   Cardiovascular: Negative.  Negative for chest pain, palpitations and leg swelling.  Gastrointestinal: Negative.  Negative for abdominal pain, blood in stool, diarrhea, nausea and vomiting.  Genitourinary: Negative.  Negative for dysuria.  Musculoskeletal: Negative.  Negative for back pain.  Skin: Negative.   Neurological: Positive for weakness. Negative for dizziness, tremors, speech change, focal weakness, seizures and headaches.  Endo/Heme/Allergies: Negative.  Does not bruise/bleed easily.  Psychiatric/Behavioral: Negative.  Negative for depression, hallucinations and suicidal ideas.    MEDICATIONS AT HOME:   Prior to Admission medications   Medication Sig Start Date End Date Taking? Authorizing Provider   amLODipine (NORVASC) 10 MG tablet Take 1 tablet by mouth daily. 12/17/12   [provider]  aspirin 81 MG tablet Take 81 mg by mouth daily.    [provider]  atorvastatin (LIPITOR) 20 MG tablet Take 1 tablet by mouth at bedtime.  12/18/12   [provider]  baclofen (LIORESAL) 10 MG tablet Take 1 tablet by mouth 3 (three) times daily. 12/18/12   [provider]  Calcium Carbonate-Vitamin D (CALCIUM + D PO) Take 2 tablets by mouth daily.    [provider]  carvedilol (COREG) 25 MG tablet Take 25 mg by mouth 2 (two) times daily with a meal. Reported on 05/29/2015 05/25/15   [provider]  cephALEXin (KEFLEX) 250 MG capsule Take 1 capsule by mouth daily. 03/07/17   [provider]  citalopram (CELEXA) 40 MG tablet Take 1 tablet (40 mg total) by mouth daily. 07/12/15   Rainey Pines, MD  clonazePAM (KLONOPIN) 0.25 MG disintegrating tablet Take 0.25 mg by mouth 2 (two) times daily as needed for anxiety.    [provider]  cloNIDine (CATAPRES) 0.2 MG tablet Take 1 tablet by mouth 2 (two) times daily. 06/20/16   [provider]  estradiol (ESTRACE) 0.1 MG/GM vaginal cream Place 1 Applicatorful vaginally at bedtime. Patient was given a sample of vaginal estrogen cream and instructed to apply 0.5mg  (pea-sized amount)  just inside the vaginal introitus with a finger-tip every night for two weeks and then Monday, Wednesday and Friday nights. Patient not taking: Reported on 07/26/2016 08/09/14   Zara Council A, PA-C  furosemide (LASIX) 20 MG tablet Take 1 tablet by mouth 2 (two) times daily.  12/17/12   [provider]  gabapentin (NEURONTIN) 100 MG capsule Take 2 capsules by mouth 2 (two) times a week. 01/13/17   [provider]  glipiZIDE (GLUCOTROL) 5 MG tablet Take 5 mg by mouth 2 (two) times daily before a meal.  12/18/12   [provider]  hydrALAZINE (APRESOLINE) 25 MG tablet Take 1 tablet (25 mg  total) by mouth 3 (three) times daily. 07/27/16 07/27/17  Hillary Bow, MD  insulin glargine (LANTUS) 100 UNIT/ML injection Inject 20 Units into the skin daily.    [provider]  mirabegron ER (MYRBETRIQ) 25 MG TB24 tablet Take 1 tablet (25 mg total) by mouth daily. 07/03/15   Zara Council A, PA-C  omeprazole (PRILOSEC) 20 MG capsule Take 2 capsules by mouth daily. 11/25/12   [provider]  predniSONE (DELTASONE) 20 MG tablet Take 1 tablet (20 mg total) by mouth daily with breakfast. 07/28/16   Hillary Bow, MD  PREMARIN vaginal cream Place 1 g vaginally once a week.  07/01/16   [provider]  [place] 108 (  90 BASE) MCG/ACT inhaler Inhale 2 puffs into the lungs every 8 (eight) hours. 10/22/12   [provider]  QUEtiapine (SEROQUEL) 100 MG tablet Take 1 tablet (100 mg total) by mouth daily. Patient not taking: Reported on 07/26/2016 07/12/15   Rainey Pines, MD  solifenacin (VESICARE) 10 MG tablet Take 1 tablet (10 mg total) by mouth daily. Patient not taking: Reported on 07/26/2016 08/09/14   Zara Council A, PA-C  spironolactone (ALDACTONE) 25 MG tablet Take 0.5 tablets by mouth daily. 06/20/16   [provider]      VITAL SIGNS:  Blood pressure (!) 149/97, pulse 76, temperature 98.3 F (36.8 C), temperature source Oral, resp. rate (!) 21, height 5\' 1"  (1.549 m), weight 106.1 kg (234 lb), SpO2 97 %.  PHYSICAL EXAMINATION:   Physical Exam  Constitutional: She is oriented to person, place, and time and well-developed, well-nourished, and in no distress. No distress.  HENT:  Head: Normocephalic.  Eyes: No scleral icterus.  Neck: Normal range of motion. Neck supple. No JVD present. No tracheal deviation present.  Cardiovascular: Normal rate, regular rhythm and normal heart sounds. Exam reveals no gallop and no friction rub.  No murmur heard. Pulmonary/Chest: Effort normal and breath sounds normal. No respiratory distress. She has no wheezes.  She has no rales. She exhibits no tenderness.  Abdominal: Soft. Bowel sounds are normal. She exhibits no distension and no mass. There is no tenderness. There is no rebound and no guarding.  Musculoskeletal: Normal range of motion. She exhibits no edema.  Neurological: She is alert and oriented to person, place, and time.  Skin: Skin is warm. No rash noted. No erythema.  Psychiatric: Affect and judgment normal.      LABORATORY PANEL:   CBC Recent Labs  Lab 03/19/17 1549  WBC 11.7*  HGB 13.3  HCT 39.5  PLT 259   ------------------------------------------------------------------------------------------------------------------  Chemistries  Recent Labs  Lab 03/19/17 1549  NA 137  K 4.4  CL 105  CO2 22  GLUCOSE 134*  BUN 24*  CREATININE 1.81*  CALCIUM 9.3   ------------------------------------------------------------------------------------------------------------------  Cardiac Enzymes Recent Labs  Lab 03/19/17 1549  TROPONINI <0.03   ------------------------------------------------------------------------------------------------------------------  RADIOLOGY:  Dg Chest 2 View  Result Date: 03/19/2017 CLINICAL DATA:  Acute presentation with bradycardia. EXAM: CHEST  2 VIEW COMPARISON:  04/26/2014 FINDINGS: External pacemakers overlie the chest. Borderline cardiomegaly. Aortic atherosclerosis. The lungs are clear. Question early interstitial edema/fluid in the fissures. No visible effusions. No significant bone finding. IMPRESSION: Borderline/early CHF. Early interstitial edema and fluid in the fissures. Electronically Signed   By: Nelson Chimes M.D.   On: 03/19/2017 16:34    EKG:    Normal sinus rhythm heart rate 70 no ST elevation or depression IMPRESSION AND PLAN:   67 year old female with diastolic heart failure and diabetes who presents to the ER with symptomatic bradycardia.  1. Symptomatic bradycardia: Discontinue Coreg for now. Continue  telemetry Consultation with cardiology requested If patient continues to have bradycardia and then probably should discontinue clonidine however at this time I am afraid she will go through rebound hypertension and currently her heart rate is in the 70's and therefore we will closely monitor patient's heart rate on telemetry.   2. Chronic kidney disease stage III: Creatinine is near baseline  3. Chronic diastolic heart failure with preserved EF: Patient is not in exacerbation at this time.  4. Essential hypertension: Continue Norvasc, clonidine, Aldactone and hydralazine  5. Diabetes: Continue Lantus and glipizide At sliding scale  with ADA diet  6. Anxiety/depression: Continue Seroquel and Klonopin when necessary   All the records are reviewed and case discussed with ED provider. Management plans discussed with the patient and she is in agreement  CODE STATUS: FULL  TOTAL TIME TAKING CARE OF THIS PATIENT: 42 minutes.    Yigit Norkus M.D on 03/19/2017 at 4:48 PM  Between 7am to 6pm - Pager - [phone]  After 6pm go to [email or web address] - password [email or web address] [facility]  Office  [phone]  CC: Primary care physician; [employer] [employer]

## 2017-03-19 NOTE — Progress Notes (Signed)
Patient educated on valuables policy. After education patient states she would like to keep her purse, clothing, and phone on her.Bed in lowest position and call bell and phone within reach. Will continue to monitor patient

## 2017-03-19 NOTE — ED Provider Notes (Signed)
[facility] Emergency Department Provider Note   ____________________________________________   First MD Initiated Contact with Patient 03/19/17 1600     (approximate)  I have reviewed the triage vital signs and the nursing notes.   HISTORY  Chief Complaint Bradycardia    HPI Kathryn Kramer is a 67 y.o. female here for evaluation of generalized weakness  Patient reports for about 2 weeks she is been expressing lightheadedness, feeling very tired, sometimes feeling sweaty and nauseated at times.  She has has not felt well.  Went to see her neurologist today and they noticed her heart rate she reports at present that she feels fine, but when she is up and walking she feels lightheaded short of breath sometimes a little bit nauseated and sweaty.  No chest pain.  Denies that this happen before.  Reports compliance with all of her medications.  Currently no pain.  Feels much better when she sitting at rest  Past Medical History:  Diagnosis Date  . Acid reflux   . Anxiety   . Arrhythmia   . BP (high blood pressure) 01/25/2013  . CCF (congestive cardiac failure) ([place]) 08/04/2013  . CHF (congestive heart failure) ([place])   . Chronic kidney disease   . Constipation   . Cystocele, grade 2   . Diabetes mellitus type I ([place])   . Diabetes mellitus without complication ([place])   . Female incontinence   . Hypertension   . Recurrent UTI   . Stroke [facility]) 2001    Patient Active Problem List   Diagnosis Date Noted  . Bradycardia 03/19/2017  . Angioedema 07/26/2016  . Urinary incontinence, mixed 07/06/2015  . Morbid (severe) obesity due to excess calories ([place]) 01/19/2015  . Morbid obesity ([place]) 12/07/2014  . Recurrent UTI 10/29/2014  . Extreme obesity 08/09/2014  . Cystocele, grade 2 08/09/2014  . Atrophic vaginitis 08/09/2014  . Incontinence 08/09/2014  . Compulsive tobacco user syndrome 05/09/2014  . Current tobacco use 05/09/2014  . History of colonic  polyps 02/21/2014  . CCF (congestive cardiac failure) ([place]) 08/04/2013  . Chronic kidney disease (CKD), stage III (moderate) ([facility]) 01/25/2013  . Diabetes ([place]) 01/25/2013  . BP (high blood pressure) 01/25/2013  . Angiopathy, peripheral ([place]) 01/25/2013  . Peripheral arterial occlusive disease ([place]) 01/25/2013  . Encounter for screening colonoscopy 12/23/2012  . Rectal bleeding 12/23/2012    Past Surgical History:  Procedure Laterality Date  . COLONOSCOPY  02/16/13  . elbow     pinch nerve  . HAND SURGERY Bilateral [id number]   carpel tunnel  . HIP SURGERY Right 1999  . MULTIPLE TOOTH EXTRACTIONS  2014  . TUBAL LIGATION  1985    Prior to Admission medications   Medication Sig Start Date End Date Taking? Authorizing Provider  amLODipine (NORVASC) 10 MG tablet Take 1 tablet by mouth daily. 12/17/12   [provider]  aspirin 81 MG tablet Take 81 mg by mouth daily.    [provider]  atorvastatin (LIPITOR) 20 MG tablet Take 1 tablet by mouth at bedtime.  12/18/12   [provider]  baclofen (LIORESAL) 10 MG tablet Take 1 tablet by mouth 3 (three) times daily. 12/18/12   [provider]  Calcium Carbonate-Vitamin D (CALCIUM + D PO) Take 2 tablets by mouth daily.    [provider]  carvedilol (COREG) 25 MG tablet Take 25 mg by mouth 2 (two) times daily with a meal. Reported on 05/29/2015 05/25/15   [provider]  cephALEXin (KEFLEX) 250 MG capsule Take 1 capsule by mouth daily. 03/07/17   [provider]  citalopram (CELEXA) 40 MG tablet Take 1 tablet (40 mg total) by mouth daily. 07/12/15   Rainey Pines, MD  clonazePAM (KLONOPIN) 0.25 MG disintegrating tablet Take 0.25 mg by mouth 2 (two) times daily as needed for anxiety.    [provider]  cloNIDine (CATAPRES) 0.2 MG tablet Take 1 tablet by mouth 2 (two) times daily. 06/20/16   [provider]  estradiol (ESTRACE) 0.1 MG/GM vaginal cream Place 1  Applicatorful vaginally at bedtime. Patient was given a sample of vaginal estrogen cream and instructed to apply 0.5mg  (pea-sized amount)  just inside the vaginal introitus with a finger-tip every night for two weeks and then Monday, Wednesday and Friday nights. Patient not taking: Reported on 07/26/2016 08/09/14   Zara Council A, PA-C  furosemide (LASIX) 20 MG tablet Take 1 tablet by mouth 2 (two) times daily.  12/17/12   [provider]  gabapentin (NEURONTIN) 100 MG capsule Take 2 capsules by mouth 2 (two) times a week. 01/13/17   [provider]  glipiZIDE (GLUCOTROL) 5 MG tablet Take 5 mg by mouth 2 (two) times daily before a meal.  12/18/12   [provider]  hydrALAZINE (APRESOLINE) 25 MG tablet Take 1 tablet (25 mg total) by mouth 3 (three) times daily. 07/27/16 07/27/17  Hillary Bow, MD  insulin glargine (LANTUS) 100 UNIT/ML injection Inject 20 Units into the skin daily.    [provider]  mirabegron ER (MYRBETRIQ) 25 MG TB24 tablet Take 1 tablet (25 mg total) by mouth daily. 07/03/15   Zara Council A, PA-C  omeprazole (PRILOSEC) 20 MG capsule Take 2 capsules by mouth daily. 11/25/12   [provider]  predniSONE (DELTASONE) 20 MG tablet Take 1 tablet (20 mg total) by mouth daily with breakfast. 07/28/16   Hillary Bow, MD  PREMARIN vaginal cream Place 1 g vaginally once a week.  07/01/16   [provider]  PROAIR HFA 108 (90 BASE) MCG/ACT inhaler Inhale 2 puffs into the lungs every 8 (eight) hours. 10/22/12   [provider]  QUEtiapine (SEROQUEL) 100 MG tablet Take 1 tablet (100 mg total) by mouth daily. Patient not taking: Reported on 07/26/2016 07/12/15   Rainey Pines, MD  solifenacin (VESICARE) 10 MG tablet Take 1 tablet (10 mg total) by mouth daily. Patient not taking: Reported on 07/26/2016 08/09/14   Zara Council A, PA-C  spironolactone (ALDACTONE) 25 MG tablet Take 0.5 tablets by mouth daily. 06/20/16   [provider]    Allergies Ace inhibitors and Pravastatin  Family History  Problem Relation Age of Onset  . Colon cancer Father   . Colon polyps Brother   . Colon cancer Brother   . Diabetes Mother   . Alcohol abuse Mother   . Heart disease Sister   . Breast cancer Sister   . Alcohol abuse Brother   . Drug abuse Brother   . Alcohol abuse Brother   . Drug abuse Brother   . Alcohol abuse Brother   . Drug abuse Brother   . Alcohol abuse Brother   . Drug abuse Brother   . Alcohol abuse Brother   . Drug abuse Brother   . Alcohol abuse Brother   . Drug abuse Brother   . Colon polyps Daughter   . Diabetes Daughter   . Kidney disease Neg Hx   . Bladder Cancer Neg Hx   .  Ovarian cancer Neg Hx     Social History Social History   Tobacco Use  . Smoking status: Current Every Day Smoker    Packs/day: 0.50    Years: 20.00    Pack years: 10.00    Types: Cigarettes  . Smokeless tobacco: Never Used  Substance Use Topics  . Alcohol use: No    Alcohol/week: 0.0 oz  . Drug use: No    Review of Systems Constitutional: No fever/chills but fatigue noted though Eyes: No visual changes. ENT: No sore throat. Cardiovascular: Denies chest pain.  See HPI Respiratory: Denies shortness of breath.  See HPI Gastrointestinal: No abdominal pain.  No vomiting.  No diarrhea.  No constipation. Genitourinary: Negative for dysuria. Musculoskeletal: Negative for back pain. Skin: Negative for rash sometimes feels sweaty. Neurological: Negative for headaches, focal weakness or numbness.    ____________________________________________   PHYSICAL EXAM:  VITAL SIGNS: ED Triage Vitals  Enc Vitals Group     BP --      Pulse Rate 03/19/17 1550 (!) 33     Resp 03/19/17 1550 20     Temp 03/19/17 1550 98.3 F (36.8 C)     Temp Source 03/19/17 1550 Oral     SpO2 03/19/17 1550 98 %     Weight 03/19/17 1547 234 lb (106.1 kg)     Height 03/19/17 1547 5\' 1"  (1.549 m)     Head Circumference  --      Peak Flow --      Pain Score --      Pain Loc --      Pain Edu? --      Excl. in [place]? --     Constitutional: Alert and oriented. Well appearing and in no acute distress. Eyes: Conjunctivae are normal. Head: Atraumatic. Nose: No congestion/rhinnorhea. Mouth/Throat: Mucous membranes are moist. Neck: No stridor.   Cardiovascular: Bradycardic rate, regular rhythm. Grossly normal heart sounds.  Good peripheral circulation. Respiratory: Normal respiratory effort.  No retractions. Lungs CTAB. Gastrointestinal: Soft and nontender. No distention. Musculoskeletal: No lower extremity tenderness nor edema. Neurologic:  Normal speech and language. No gross focal neurologic deficits are appreciated.  Skin:  Skin is warm, slightly diaphoretic and intact. No rash noted. Psychiatric: Mood and affect are normal. Speech and behavior are normal.  ____________________________________________   LABS (all labs ordered are listed, but only abnormal results are displayed)  Labs Reviewed  BASIC METABOLIC PANEL - Abnormal; Notable for the following components:      Result Value   Glucose, Bld 134 (*)    BUN 24 (*)    Creatinine, Ser 1.81 (*)    GFR calc non Af Amer 28 (*)    GFR calc Af Amer 32 (*)    All other components within normal limits  CBC - Abnormal; Notable for the following components:   WBC 11.7 (*)    All other components within normal limits  MAGNESIUM - Abnormal; Notable for the following components:   Magnesium 1.6 (*)    All other components within normal limits  TROPONIN I   ____________________________________________  EKG  Reviewed enterotomy at 1547 Ventricular rate 70 QRS 80 QTC 460 Sinus rhythm with frequent PVCs.  Her rhythm strip was done from telemetry monitoring at 1558, the patient demonstrates ventricular bigeminy, perfusing only every other beat for a mechanical rate of approximately 33 ____________________________________________  RADIOLOGY  Dg  Chest 2 View  Result Date: 03/19/2017 CLINICAL DATA:  Acute presentation with bradycardia. EXAM: CHEST  2  VIEW COMPARISON:  04/26/2014 FINDINGS: External pacemakers overlie the chest. Borderline cardiomegaly. Aortic atherosclerosis. The lungs are clear. Question early interstitial edema/fluid in the fissures. No visible effusions. No significant bone finding. IMPRESSION: Borderline/early CHF. Early interstitial edema and fluid in the fissures. Electronically Signed   By: Nelson Chimes M.D.   On: 03/19/2017 16:34    Chest x-ray reviewed, interstitial edema ____________________________________________   PROCEDURES  Procedure(s) performed: None  Procedures  Critical Care performed: Yes, see critical care note(s)  CRITICAL CARE Performed by: Delman Kitten   Total critical care time: 35 minutes  Critical care time was exclusive of separately billable procedures and treating other patients.  Critical care was necessary to treat or prevent imminent or life-threatening deterioration.  Critical care was time spent personally by me on the following activities: development of treatment plan with patient and/or surrogate as well as nursing, discussions with consultants, evaluation of patient's response to treatment, examination of patient, obtaining history from patient or surrogate, ordering and performing treatments and interventions, ordering and review of laboratory studies, ordering and review of radiographic studies, pulse oximetry and re-evaluation of patient's condition.  Patient seen and evaluated rapidly due to concerns for acute cardiac instability, heart rate less than 40 requiring cardiology consultation.  ----------------------------------------- 4:05 PM on 03/19/2017 -----------------------------------------  Case discussed with Dr. Josefa Half, he advises admission to the hospital but no other acute intervention in the ER at this  time.  ____________________________________________   INITIAL IMPRESSION / ASSESSMENT AND PLAN / ED COURSE  Pertinent labs & imaging results that were available during my care of the patient were reviewed by me and considered in my medical decision making (see chart for details).  Patient continues to rest comfortably for close monitoring the ER, she does have bradycardia on plus.  Because of her associated symptoms, will admit her to the hospital for further evaluation observation and cardiology consult.  Case discussed with Dr. Benjie Karvonen      ____________________________________________   FINAL CLINICAL IMPRESSION(S) / ED DIAGNOSES  Final diagnoses:  Ventricular ectopic activity  Symptomatic bradycardia  Edema, unspecified type  AKI (acute kidney injury) ([place])      NEW MEDICATIONS STARTED DURING THIS VISIT:  New Prescriptions   No medications on file     Note:  This document was prepared using Dragon voice recognition software and may include unintentional dictation errors.     Delman Kitten, MD 03/19/17 [phone]

## 2017-03-19 NOTE — ED Triage Notes (Signed)
Pt to triage via wheelchair.  Pt sent from dr potter's office with bradycardia.  Pt diaphoretic.  Pt denies chest pain.  No sob.  Pt  Alert   Speech clear.

## 2017-03-19 NOTE — Progress Notes (Signed)
Anticoagulation monitoring(Lovenox):  67 yo female ordered Lovenox 40 mg Q24h  Filed Weights   03/19/17 1547 03/19/17 1822  Weight: 234 lb (106.1 kg) 231 lb 12.8 oz (105.1 kg)   BMI 43.8    Lab Results  Component Value Date   CREATININE 1.81 (H) 03/19/2017   CREATININE 1.32 (H) 07/27/2016   CREATININE 1.74 (H) 07/26/2016   Estimated Creatinine Clearance: 34.1 mL/min (A) (by C-G formula based on SCr of 1.81 mg/dL (H)). Hemoglobin & Hematocrit     Component Value Date/Time   HGB 13.3 03/19/2017 1549   HGB 12.0 10/02/2013 2350   HCT 39.5 03/19/2017 1549   HCT 37.2 10/02/2013 2350     Per Protocol for Patient with estCrcl > 30 ml/min and BMI > 40, will transition to Lovenox 40 mg Q12h.

## 2017-03-20 DIAGNOSIS — R001 Bradycardia, unspecified: Secondary | ICD-10-CM | POA: Diagnosis not present

## 2017-03-20 LAB — BASIC METABOLIC PANEL
ANION GAP: 12 (ref 5–15)
BUN: 27 mg/dL — ABNORMAL HIGH (ref 6–20)
CHLORIDE: 105 mmol/L (ref 101–111)
CO2: 19 mmol/L — AB (ref 22–32)
CREATININE: 2 mg/dL — AB (ref 0.44–1.00)
Calcium: 8.9 mg/dL (ref 8.9–10.3)
GFR calc non Af Amer: 25 mL/min — ABNORMAL LOW (ref >60)
GFR, EST AFRICAN AMERICAN: 29 mL/min — AB (ref >60)
Glucose, Bld: 90 mg/dL (ref 65–99)
POTASSIUM: 4.1 mmol/L (ref 3.5–5.1)
Sodium: 136 mmol/L (ref 135–145)

## 2017-03-20 LAB — GLUCOSE, CAPILLARY
GLUCOSE-CAPILLARY: 107 mg/dL — AB (ref 65–99)
Glucose-Capillary: 199 mg/dL — ABNORMAL HIGH (ref 65–99)
Glucose-Capillary: 229 mg/dL — ABNORMAL HIGH (ref 65–99)

## 2017-03-20 NOTE — Consult Note (Signed)
[facility] Consultation Note  Patient ID: Kathryn Kramer, MRN: [id number], DOB/AGE: June 24, 1950 67 y.o. Admit date: 03/19/2017   Date of Consult: 03/20/2017 Primary Physician: Inc, [facility] Primary Cardiologist: Call would in the remote past  Chief Complaint:  Chief Complaint  Patient presents with  . Bradycardia   Reason for Consult: Shortness of breath  HPI: 67 y.o. female with known history of diastolic dysfunction congestive heart failure with mild aortic valve stenosis diabetes with complications essential hypertension mixed hyperlipidemia and chronic kidney disease stage III who has had appropriate medication management for most of her issues having new onset of shortness of breath consistent with acute on chronic diastolic dysfunction heart failure with an x-ray consistent with mild pulmonary edema.  The patient did not have any significant hypoxia and her troponins were normal suggesting no evidence of myocardial infarction.  The patient does have some ventricular bigeminy and slower pulse but no evidence of true bradycardia with heart rate averaging 60-70 bpm and likely influenced by clonidine use.  The patient has had some treatment with furosemide with improvements of symptoms and less shortness of breath at this time.  Telemetry has shown ventricular bigeminy with a heart rate of 60-70 bpm  Past Medical History:  Diagnosis Date  . Acid reflux   . Anxiety   . Arrhythmia   . BP (high blood pressure) 01/25/2013  . CCF (congestive cardiac failure) ([place]) 08/04/2013  . CHF (congestive heart failure) ([place])   . Chronic kidney disease   . Constipation   . Cystocele, grade 2   . Diabetes mellitus type I ([place])   . Diabetes mellitus without complication ([place])   . Female incontinence   . Hypertension   . Recurrent UTI   . Stroke [facility]) 2001      Surgical History:  Past Surgical History:  Procedure Laterality Date  . COLONOSCOPY  02/16/13  . elbow      pinch nerve  . HAND SURGERY Bilateral [id number]   carpel tunnel  . HIP SURGERY Right 1999  . MULTIPLE TOOTH EXTRACTIONS  2014  . TUBAL LIGATION  1985     Home Meds: Prior to Admission medications   Medication Sig Start Date End Date Taking? Authorizing Provider  amLODipine (NORVASC) 10 MG tablet Take 1 tablet by mouth daily. 12/17/12  Yes [provider]  aspirin 81 MG tablet Take 81 mg by mouth daily.   Yes [provider]  atorvastatin (LIPITOR) 20 MG tablet Take 1 tablet by mouth at bedtime.  12/18/12  Yes [provider]  baclofen (LIORESAL) 10 MG tablet Take 1 tablet by mouth 3 (three) times daily. 12/18/12  Yes [provider]  Calcium Carbonate-Vitamin D (CALCIUM + D PO) Take 2 tablets by mouth daily.   Yes [provider]  carvedilol (COREG) 25 MG tablet Take 25 mg by mouth 2 (two) times daily with a meal. Reported on 05/29/2015 05/25/15  Yes [provider]  cephALEXin (KEFLEX) 250 MG capsule Take 1 capsule by mouth daily. 03/07/17  Yes [provider]  citalopram (CELEXA) 40 MG tablet Take 1 tablet (40 mg total) by mouth daily. 07/12/15  Yes Rainey Pines, MD  clonazePAM (KLONOPIN) 0.25 MG disintegrating tablet Take 0.25 mg by mouth 2 (two) times daily as needed for anxiety.   Yes [provider]  cloNIDine (CATAPRES) 0.2 MG tablet Take 1 tablet by mouth 2 (two) times daily. 06/20/16  Yes [provider]  furosemide (LASIX) 20 MG  tablet Take 1 tablet by mouth 2 (two) times daily.  12/17/12  Yes [provider]  gabapentin (NEURONTIN) 100 MG capsule Take 2 capsules by mouth 2 (two) times a week. 01/13/17  Yes [provider]  glipiZIDE (GLUCOTROL) 5 MG tablet Take 5 mg by mouth 2 (two) times daily before a meal.  12/18/12  Yes [provider]  hydrALAZINE (APRESOLINE) 25 MG tablet Take 1 tablet (25 mg total) by mouth 3 (three) times daily. 07/27/16 07/27/17 Yes Sudini, Alveta Heimlich, MD   insulin glargine (LANTUS) 100 UNIT/ML injection Inject 20 Units into the skin daily.   Yes [provider]  mirabegron ER (MYRBETRIQ) 25 MG TB24 tablet Take 1 tablet (25 mg total) by mouth daily. 07/03/15  Yes McGowan, Larene Beach A, PA-C  omeprazole (PRILOSEC) 20 MG capsule Take 2 capsules by mouth daily. 11/25/12  Yes [provider]  predniSONE (DELTASONE) 20 MG tablet Take 1 tablet (20 mg total) by mouth daily with breakfast. 07/28/16  Yes Sudini, Alveta Heimlich, MD  solifenacin (VESICARE) 10 MG tablet Take 1 tablet (10 mg total) by mouth daily. 08/09/14  Yes McGowan, Larene Beach A, PA-C  spironolactone (ALDACTONE) 25 MG tablet Take 12.5 mg by mouth daily.  06/20/16  Yes [provider]  estradiol (ESTRACE) 0.1 MG/GM vaginal cream Place 1 Applicatorful vaginally at bedtime. Patient was given a sample of vaginal estrogen cream and instructed to apply 0.5mg  (pea-sized amount)  just inside the vaginal introitus with a finger-tip every night for two weeks and then Monday, Wednesday and Friday nights. Patient not taking: Reported on 07/26/2016 08/09/14   Zara Council A, PA-C  PREMARIN vaginal cream Place 1 g vaginally once a week.  07/01/16   [provider]  PROAIR HFA 108 (90 BASE) MCG/ACT inhaler Inhale 2 puffs into the lungs every 8 (eight) hours. 10/22/12   [provider]  QUEtiapine (SEROQUEL) 100 MG tablet Take 1 tablet (100 mg total) by mouth daily. Patient not taking: Reported on 07/26/2016 07/12/15   Rainey Pines, MD    Inpatient Medications:  . albuterol  2.5 mg Inhalation Q8H  . amLODipine  10 mg Oral Daily  . aspirin EC  81 mg Oral Daily  . atorvastatin  20 mg Oral QHS  . baclofen  10 mg Oral TID  . citalopram  40 mg Oral Daily  . cloNIDine  0.2 mg Oral BID  . conjugated estrogens  1 g Vaginal Weekly  . darifenacin  7.5 mg Oral Daily  . enoxaparin (LOVENOX) injection  40 mg Subcutaneous Q12H  . estradiol  1 Applicatorful Vaginal QHS  . furosemide  20 mg  Oral BID  . glipiZIDE  5 mg Oral BID AC  . hydrALAZINE  25 mg Oral TID  . insulin aspart  0-5 Units Subcutaneous QHS  . insulin aspart  0-9 Units Subcutaneous TID WC  . insulin glargine  20 Units Subcutaneous Daily  . mirabegron ER  25 mg Oral Daily  . pantoprazole  40 mg Oral Daily  . predniSONE  20 mg Oral Q breakfast  . QUEtiapine  100 mg Oral Daily  . spironolactone  12.5 mg Oral Daily     Allergies:  Allergies  Allergen Reactions  . Ace Inhibitors Swelling  . Pravastatin Itching    Per patient "itching on the bottom of feet"    Social History   Socioeconomic History  . Marital status: Married    Spouse name: Not on file  . Number of children: Not on file  .  Years of education: Not on file  . Highest education level: Not on file  Social Needs  . Financial resource strain: Not on file  . Food insecurity - worry: Not on file  . Food insecurity - inability: Not on file  . Transportation needs - medical: Not on file  . Transportation needs - non-medical: Not on file  Occupational History  . Occupation: Retired  Tobacco Use  . Smoking status: Current Every Day Smoker    Packs/day: 0.50    Years: 20.00    Pack years: 10.00    Types: Cigarettes  . Smokeless tobacco: Never Used  Substance and Sexual Activity  . Alcohol use: No    Alcohol/week: 0.0 oz  . Drug use: No  . Sexual activity: No    Birth control/protection: None  Other Topics Concern  . Not on file  Social History Narrative  . Not on file     Family History  Problem Relation Age of Onset  . Colon cancer Father   . Colon polyps Brother   . Colon cancer Brother   . Diabetes Mother   . Alcohol abuse Mother   . Heart disease Sister   . Breast cancer Sister   . Alcohol abuse Brother   . Drug abuse Brother   . Alcohol abuse Brother   . Drug abuse Brother   . Alcohol abuse Brother   . Drug abuse Brother   . Alcohol abuse Brother   . Drug abuse Brother   . Alcohol abuse Brother   . Drug abuse  Brother   . Alcohol abuse Brother   . Drug abuse Brother   . Colon polyps Daughter   . Diabetes Daughter   . Kidney disease Neg Hx   . Bladder Cancer Neg Hx   . Ovarian cancer Neg Hx      Review of Systems Positive for breath Negative for: General:  chills, fever, night sweats or weight changes.  Cardiovascular: PND orthopnea syncope dizziness  Dermatological skin lesions rashes Respiratory: Cough congestion Urologic: Frequent urination urination at night and hematuria Abdominal: negative for nausea, vomiting, diarrhea, bright red blood per rectum, melena, or hematemesis Neurologic: negative for visual changes, and/or hearing changes  All other systems reviewed and are otherwise negative except as noted above.  Labs: Recent Labs    03/19/17 1549  TROPONINI <0.03   Lab Results  Component Value Date   WBC 11.7 (H) 03/19/2017   HGB 13.3 03/19/2017   HCT 39.5 03/19/2017   MCV 87.6 03/19/2017   PLT 259 03/19/2017    Recent Labs  Lab 03/20/17 0435  NA 136  K 4.1  CL 105  CO2 19*  BUN 27*  CREATININE 2.00*  CALCIUM 8.9  GLUCOSE 90   No results found for: CHOL, HDL, LDLCALC, TRIG No results found for: DDIMER  Radiology/Studies:  Dg Chest 2 View  Result Date: 03/19/2017 CLINICAL DATA:  Acute presentation with bradycardia. EXAM: CHEST  2 VIEW COMPARISON:  04/26/2014 FINDINGS: External pacemakers overlie the chest. Borderline cardiomegaly. Aortic atherosclerosis. The lungs are clear. Question early interstitial edema/fluid in the fissures. No visible effusions. No significant bone finding. IMPRESSION: Borderline/early CHF. Early interstitial edema and fluid in the fissures. Electronically Signed   By: Nelson Chimes M.D.   On: 03/19/2017 16:34    EKG: Normal sinus rhythm with heart rate of 70 bpm with frequent preventricular contractions  Weights: Filed Weights   03/19/17 1547 03/19/17 1822 03/20/17 0412  Weight: 106.1 kg (234 lb)  105.1 kg (231 lb 12.8 oz) 104.2 kg  (229 lb 12.8 oz)     Physical Exam: Blood pressure (!) 131/96, pulse 63, temperature 98.9 F (37.2 C), temperature source Oral, resp. rate 18, height 5\' 1"  (1.549 m), weight 104.2 kg (229 lb 12.8 oz), SpO2 94 %. Body mass index is 43.42 kg/m. General: Well developed, well nourished, in no acute distress. Head eyes ears nose throat: Normocephalic, atraumatic, sclera non-icteric, no xanthomas, nares are without discharge. No apparent thyromegaly and/or mass  Lungs: Normal respiratory effort.  no wheezes, no rales, no rhonchi.  Few basilar crackles Heart: Irregular with normal S1 S2.  2+ right upper sternal border murmur gallop, no rub, PMI is normal size and placement, carotid upstroke normal without bruit, jugular venous pressure is normal Abdomen: Soft, non-tender, non-distended with normoactive bowel sounds. No hepatomegaly. No rebound/guarding. No obvious abdominal masses. Abdominal aorta is normal size without bruit Extremities: Trace edema. no cyanosis, no clubbing, no ulcers  Peripheral : 2+ bilateral upper extremity pulses, 2+ bilateral femoral pulses, 2+ bilateral dorsal pedal pulse Neuro: Alert and oriented. No facial asymmetry. No focal deficit. Moves all extremities spontaneously. Musculoskeletal: Normal muscle tone without kyphosis Psych:  Responds to questions appropriately with a normal affect.    Assessment: 67 year old female with acute on chronic diastolic dysfunction congestive heart failure multifactorial in nature including possible dietary issues chronic kidney disease stage III diabetes and hypertension now improved with some periods of asymptomatic bradycardia most consistent with use of clonidine as well as ventricular bigeminy stable at this time.  There is no evidence of myocardial infarction at this time  Plan: 1.  Continue diuresis and treatment of chronic kidney disease and diastolic dysfunction congestive heart failure 2.  Continue current medical regimen for  hypertension control relatively stable at this time although slightly exacerbating bradycardia 3.  No further cardiac diagnostics necessary at this time with echocardiogram last year showing normal LV 4.  Begin ambulation and follow for heart rate control significance of ventricular bigeminy and symptoms of shortness of breath 5.  Okay for discharge home if ambulating well with good blood pressure control and no further symptoms with follow-up next week with adjustment of medications  Signed, Corey Skains M.D. [facility] 03/20/2017, 7:44 AM

## 2017-03-20 NOTE — Plan of Care (Signed)
  Adequate for Discharge Education: Knowledge of General Education information will improve 03/20/2017 1103 - Adequate for Discharge by Feliberto Gottron, [place] Behavior/Discharge Planning: Ability to manage health-related needs will improve 03/20/2017 1103 - Adequate for Discharge by Feliberto Gottron, RN Clinical Measurements: Ability to maintain clinical measurements within normal limits will improve 03/20/2017 1103 - Adequate for Discharge by Chriss Czar, Illene Bolus, RN Will remain free from infection 03/20/2017 1103 - Adequate for Discharge by Feliberto Gottron, RN Diagnostic test results will improve 03/20/2017 1103 - Adequate for Discharge by Feliberto Gottron, RN Respiratory complications will improve 03/20/2017 1103 - Adequate for Discharge by Feliberto Gottron, RN Cardiovascular complication will be avoided 03/20/2017 1103 - Adequate for Discharge by Chriss Czar, Illene Bolus, RN Activity: Risk for activity intolerance will decrease 03/20/2017 1103 - Adequate for Discharge by Feliberto Gottron, RN Nutrition: Adequate nutrition will be maintained 03/20/2017 1103 - Adequate for Discharge by Feliberto Gottron, RN Coping: Level of anxiety will decrease 03/20/2017 1103 - Adequate for Discharge by Chriss Czar, Illene Bolus, RN Elimination: Will not experience complications related to bowel motility 03/20/2017 1103 - Adequate for Discharge by Chriss Czar, Illene Bolus, RN Will not experience complications related to urinary retention 03/20/2017 1103 - Adequate for Discharge by Feliberto Gottron, RN Pain Managment: General experience of comfort will improve 03/20/2017 1103 - Adequate for Discharge by Feliberto Gottron, RN Safety: Ability to remain free from injury will improve 03/20/2017 1103 - Adequate for Discharge by Feliberto Gottron, RN Skin Integrity: Risk for impaired skin integrity will decrease 03/20/2017 1103  - Adequate for Discharge by Feliberto Gottron, RN Activity: Ability to tolerate increased activity will improve 03/20/2017 1103 - Adequate for Discharge by Feliberto Gottron, RN Cardiac: Ability to achieve and maintain adequate cardiopulmonary perfusion will improve 03/20/2017 1103 - Adequate for Discharge by Feliberto Gottron, RN

## 2017-03-20 NOTE — Discharge Summary (Signed)
[facility] [place] NAME: Kathryn Kramer    MR#:  [id number]  DATE OF BIRTH:  12-17-1950  DATE OF ADMISSION:  03/19/2017 ADMITTING PHYSICIAN: Bettey Costa, MD  DATE OF DISCHARGE: 03/20/2017  3:21 PM  PRIMARY CARE PHYSICIAN: [facility] [place] DIAGNOSIS:  Ventricular ectopic activity [I49.3] Symptomatic bradycardia [R00.1] AKI (acute kidney injury) ([place]) [N17.9] Edema, unspecified type [R60.9]  DISCHARGE DIAGNOSIS:  Active Problems:   Bradycardia   SECONDARY DIAGNOSIS:   Past Medical History:  Diagnosis Date  . Acid reflux   . Anxiety   . Arrhythmia   . BP (high blood pressure) 01/25/2013  . CCF (congestive cardiac failure) ([place]) 08/04/2013  . CHF (congestive heart failure) ([place])   . Chronic kidney disease   . Constipation   . Cystocele, grade 2   . Diabetes mellitus type I ([place])   . Diabetes mellitus without complication ([place])   . Female incontinence   . Hypertension   . Recurrent UTI   . Stroke [facility]) 2001    HOSPITAL COURSE:   67 year old female with past medical history of chronic diastolic CHF, diabetes, hypertension, chronic kidney disease stage II, history of previous CVA, anxiety, who presented to the hospital due to episodes of lightheadedness and shortness of breath and was noted to be bradycardic with episodes of bigeminy on telemetry.  1. Symptomatic Bradycardia--patient presented to the hospital with heart rates in the 30s to 40s with episodes of bigeminy. Patient's electrolytes were stable. -Patient was observed overnight in telemetry, her carvedilol was discontinued. She was still maintaining on her clonidine as it was some concern for possible rebound hypertension of this was discontinued. Patient was seen by cardiology who agreed with this. She is no longer bradycardic and is asymptomatic upon ambulation today and therefore being discharged.  2. Essential hypertension-patient was taken off her  carvedilol due to the bradycardia. She will continue her Norvasc, clonidine, Aldactone, hydralazine.  3. Chronic diastolic CHF-patient was not in congestive heart failure on the hospital. She will resume her maintenance meds as mentioned below.  4. Diabetes type 2 without complication-patient will resume her Lantus, glipizide upon discharge.  5. Anxiety/depression-patient will continue her Seroquel, Klonopin.  6. GERD - pt. Will cont. Omeprazole.   7. Hyperlipidemia - pt. Will cont. Atorvastatin.   DISCHARGE CONDITIONS:   Stable  CONSULTS OBTAINED:  Treatment Team:  Isaias Cowman, MD Corey Skains, MD  DRUG ALLERGIES:   Allergies  Allergen Reactions  . Ace Inhibitors Swelling  . Pravastatin Itching    Per patient "itching on the bottom of feet"    DISCHARGE MEDICATIONS:   Allergies as of 03/20/2017      Reactions   Ace Inhibitors Swelling   Pravastatin Itching   Per patient "itching on the bottom of feet"      Medication List    STOP taking these medications   carvedilol 25 MG tablet Commonly known as:  COREG   estradiol 0.1 MG/GM vaginal cream Commonly known as:  ESTRACE   QUEtiapine 100 MG tablet Commonly known as:  SEROQUEL     TAKE these medications   amLODipine 10 MG tablet Commonly known as:  NORVASC Take 1 tablet by mouth daily.   aspirin 81 MG tablet Take 81 mg by mouth daily.   atorvastatin 20 MG tablet Commonly known as:  LIPITOR Take 1 tablet by mouth at bedtime.   baclofen 10 MG tablet Commonly known as:  LIORESAL  Take 1 tablet by mouth 3 (three) times daily.   CALCIUM + D PO Take 2 tablets by mouth daily.   cephALEXin 250 MG capsule Commonly known as:  KEFLEX Take 1 capsule by mouth daily.   citalopram 40 MG tablet Commonly known as:  CELEXA Take 1 tablet (40 mg total) by mouth daily.   clonazePAM 0.25 MG disintegrating tablet Commonly known as:  KLONOPIN Take 0.25 mg by mouth 2 (two) times daily as needed for  anxiety.   cloNIDine 0.2 MG tablet Commonly known as:  CATAPRES Take 1 tablet by mouth 2 (two) times daily.   furosemide 20 MG tablet Commonly known as:  LASIX Take 1 tablet by mouth 2 (two) times daily.   gabapentin 100 MG capsule Commonly known as:  NEURONTIN Take 2 capsules by mouth 2 (two) times a week.   glipiZIDE 5 MG tablet Commonly known as:  GLUCOTROL Take 5 mg by mouth 2 (two) times daily before a meal.   hydrALAZINE 25 MG tablet Commonly known as:  APRESOLINE Take 1 tablet (25 mg total) by mouth 3 (three) times daily.   insulin glargine 100 UNIT/ML injection Commonly known as:  LANTUS Inject 20 Units into the skin daily.   mirabegron ER 25 MG Tb24 tablet Commonly known as:  MYRBETRIQ Take 1 tablet (25 mg total) by mouth daily.   omeprazole 20 MG capsule Commonly known as:  PRILOSEC Take 2 capsules by mouth daily.   predniSONE 20 MG tablet Commonly known as:  DELTASONE Take 1 tablet (20 mg total) by mouth daily with breakfast.   PREMARIN vaginal cream Generic drug:  conjugated estrogens Place 1 g vaginally once a week.   PROAIR HFA 108 (90 Base) MCG/ACT inhaler Generic drug:  albuterol Inhale 2 puffs into the lungs every 8 (eight) hours.   solifenacin 10 MG tablet Commonly known as:  VESICARE Take 1 tablet (10 mg total) by mouth daily.   spironolactone 25 MG tablet Commonly known as:  ALDACTONE Take 12.5 mg by mouth daily.         DISCHARGE INSTRUCTIONS:   DIET:  Cardiac diet  DISCHARGE CONDITION:  Stable  ACTIVITY:  Activity as tolerated  OXYGEN:  Home Oxygen: No.   Oxygen Delivery: room air  DISCHARGE LOCATION:  home   If you experience worsening of your admission symptoms, develop shortness of breath, life threatening emergency, suicidal or homicidal thoughts you must seek medical attention immediately by calling 911 or calling your MD immediately  if symptoms less severe.  You Must read complete instructions/literature  along with all the possible adverse reactions/side effects for all the Medicines you take and that have been prescribed to you. Take any new Medicines after you have completely understood and accpet all the possible adverse reactions/side effects.   Please note  You were cared for by a hospitalist during your hospital stay. If you have any questions about your discharge medications or the care you received while you were in the hospital after you are discharged, you can call the unit and asked to speak with the hospitalist on call if the hospitalist that took care of you is not available. Once you are discharged, your primary care physician will handle any further medical issues. Please note that NO REFILLS for any discharge medications will be authorized once you are discharged, as it is imperative that you return to your primary care physician (or establish a relationship with a primary care physician if you do not have one) for  your aftercare needs so that they can reassess your need for medications and monitor your lab values.     Today   Further bradycardia, does complain of shortness of breath but her lungs are clear and she is not hypoxic. No chest pain, palpitations or any other symptoms presently.  VITAL SIGNS:  Blood pressure (!) 143/83, pulse 65, temperature 98.5 F (36.9 C), temperature source Oral, resp. rate 18, height 5\' 1"  (1.549 m), weight 104.2 kg (229 lb 12.8 oz), SpO2 96 %.  I/O:    Intake/Output Summary (Last 24 hours) at 03/20/2017 1636 Last data filed at 03/20/2017 1426 Gross per 24 hour  Intake 600 ml  Output 700 ml  Net -100 ml    PHYSICAL EXAMINATION:  GENERAL:  67 y.o.-year-old obese patient lying in the bed with no acute distress.  EYES: Pupils equal, round, reactive to light and accommodation. No scleral icterus. Extraocular muscles intact.  HEENT: Head atraumatic, normocephalic. Oropharynx and nasopharynx clear.  NECK:  Supple, no jugular venous distention.  No thyroid enlargement, no tenderness.  LUNGS: Normal breath sounds bilaterally, no wheezing, rales,rhonchi. No use of accessory muscles of respiration.  CARDIOVASCULAR: S1, S2 normal. No murmurs, rubs, or gallops.  ABDOMEN: Soft, non-tender, non-distended. Bowel sounds present. No organomegaly or mass.  EXTREMITIES: +1 edema b/l , No cyanosis, or clubbing.  NEUROLOGIC: Cranial nerves II through XII are intact. No focal motor or sensory defecits b/l.  PSYCHIATRIC: The patient is alert and oriented x 3.  SKIN: No obvious rash, lesion, or ulcer.   DATA REVIEW:   CBC Recent Labs  Lab 03/19/17 1549  WBC 11.7*  HGB 13.3  HCT 39.5  PLT 259    Chemistries  Recent Labs  Lab 03/19/17 1549 03/20/17 0435  NA 137 136  K 4.4 4.1  CL 105 105  CO2 22 19*  GLUCOSE 134* 90  BUN 24* 27*  CREATININE 1.81* 2.00*  CALCIUM 9.3 8.9  MG 1.6*  --     Cardiac Enzymes Recent Labs  Lab 03/19/17 1549  TROPONINI <0.03    Microbiology Results  Results for orders placed or performed during the hospital encounter of 07/26/16  MRSA PCR Screening     Status: None   Collection Time: 07/26/16  2:02 AM  Result Value Ref Range Status   MRSA by PCR NEGATIVE NEGATIVE Final    Comment:        The GeneXpert MRSA Assay (FDA approved for NASAL specimens only), is one component of a comprehensive MRSA colonization surveillance program. It is not intended to diagnose MRSA infection nor to guide or monitor treatment for MRSA infections.     RADIOLOGY:  Dg Chest 2 View  Result Date: 03/19/2017 CLINICAL DATA:  Acute presentation with bradycardia. EXAM: CHEST  2 VIEW COMPARISON:  04/26/2014 FINDINGS: External pacemakers overlie the chest. Borderline cardiomegaly. Aortic atherosclerosis. The lungs are clear. Question early interstitial edema/fluid in the fissures. No visible effusions. No significant bone finding. IMPRESSION: Borderline/early CHF. Early interstitial edema and fluid in the fissures.  Electronically Signed   By: Nelson Chimes M.D.   On: 03/19/2017 16:34      Management plans discussed with the patient, family and they are in agreement.  CODE STATUS:     Code Status Orders  (From admission, onward)        Start     Ordered   03/19/17 1832  Full code  Continuous     03/19/17 1831    Code Status History  Date Active Date Inactive Code Status Order ID Comments User Context   07/26/2016 12:38 07/27/2016 14:12 Full Code [id number]  Saundra Shelling, MD Inpatient      TOTAL TIME TAKING CARE OF THIS PATIENT: 40 minutes.    Henreitta Leber M.D on 03/20/2017 at 4:36 PM  Between 7am to 6pm - Pager - [phone]  After 6pm go to [email or web address] - [employer] [facility]  Office  [phone]  CC: Primary care physician; [employer] [employer]

## 2017-03-20 NOTE — Progress Notes (Signed)
Discharge instructions given. Heart failure packet given by pharmacy and patient educated on it. Pt verbalized understanding with no further questions.  IV taken out and tele monitor off. Patient was waiting for her daughter to come pick her up. She is here now and ready for discharge.  Pt transported via wheelchair.

## 2017-03-20 NOTE — Progress Notes (Signed)
Provided patient with "Living Better with Heart Failure" packet. Briefly reviewed definition of heart failure and signs and symptoms of an exacerbation. Reviewed importance of and reason behind checking weight daily in the AM, after using the bathroom, but before getting dressed. Discussed when to call the Dr= weight gain of >2lb overnight of 5lb in a week,  Discussed yellow zone= call MD: weight gain of >2lb overnight of 5lb in a week, increased swelling, increased SOB when lying down, chest discomfort, dizziness, increased fatigue Red Zone= call 911: struggle to breath, fainting or near fainting, significant chest pain Reviewed low sodium diet <2g/day-provided handout of recommended and not recommended foods  Fluid restriction <2L/day Reviewed how to read nutrition label Reviewed medication changes: stop carvedilol Explained briefly why pt is on the medications (either make you feel better, live longer or keep you out of the hospital) and discussed monitoring and side effects  Discussed tobacco cessation: Patient no longer smoking Discussed exercise: Encouraged exercise PRN  Ulice Dash, PharmD Clinical Pharmacist

## 2017-03-20 NOTE — Plan of Care (Signed)
  Progressing Safety: Ability to remain free from injury will improve 03/20/2017 0815 - Progressing by Jeri Cos, RN Activity: Ability to tolerate increased activity will improve 03/20/2017 0815 - Progressing by Jeri Cos, RN Cardiac: Ability to achieve and maintain adequate cardiopulmonary perfusion will improve 03/20/2017 0815 - Progressing by Jeri Cos, RN

## 2017-03-27 ENCOUNTER — Emergency Department
Admission: EM | Admit: 2017-03-27 | Discharge: 2017-03-27 | Disposition: A | Discharge status: Home / Self Care | Payer: Medicare Other | Attending: Student in an Organized Health Care Education/Training Program | Admitting: Student in an Organized Health Care Education/Training Program

## 2017-03-27 DIAGNOSIS — N183 Chronic kidney disease, stage 3 (moderate): Secondary | ICD-10-CM | POA: Diagnosis not present

## 2017-03-27 DIAGNOSIS — I509 Heart failure, unspecified: Secondary | ICD-10-CM | POA: Insufficient documentation

## 2017-03-27 DIAGNOSIS — I493 Ventricular premature depolarization: Secondary | ICD-10-CM | POA: Diagnosis not present

## 2017-03-27 DIAGNOSIS — I130 Hypertensive heart and chronic kidney disease with heart failure and stage 1 through stage 4 chronic kidney disease, or unspecified chronic kidney disease: Secondary | ICD-10-CM | POA: Insufficient documentation

## 2017-03-27 DIAGNOSIS — R001 Bradycardia, unspecified: Secondary | ICD-10-CM | POA: Diagnosis present

## 2017-03-27 DIAGNOSIS — Z794 Long term (current) use of insulin: Secondary | ICD-10-CM | POA: Diagnosis not present

## 2017-03-27 DIAGNOSIS — E1022 Type 1 diabetes mellitus with diabetic chronic kidney disease: Secondary | ICD-10-CM | POA: Diagnosis not present

## 2017-03-27 DIAGNOSIS — Z79899 Other long term (current) drug therapy: Secondary | ICD-10-CM | POA: Diagnosis not present

## 2017-03-27 DIAGNOSIS — F17210 Nicotine dependence, cigarettes, uncomplicated: Secondary | ICD-10-CM | POA: Insufficient documentation

## 2017-03-27 LAB — CBC WITH DIFFERENTIAL/PLATELET
BASOS ABS: 0.1 10*3/uL (ref 0–0.1)
BASOS PCT: 1 %
EOS ABS: 0.5 10*3/uL (ref 0–0.7)
Eosinophils Relative: 4 %
HCT: 43.6 % (ref 35.0–47.0)
HEMOGLOBIN: 14.2 g/dL (ref 12.0–16.0)
Lymphocytes Relative: 39 %
Lymphs Abs: 4.5 10*3/uL — ABNORMAL HIGH (ref 1.0–3.6)
MCH: 28.8 pg (ref 26.0–34.0)
MCHC: 32.5 g/dL (ref 32.0–36.0)
MCV: 88.5 fL (ref 80.0–100.0)
Monocytes Absolute: 1.2 10*3/uL — ABNORMAL HIGH (ref 0.2–0.9)
Monocytes Relative: 10 %
NEUTROS PCT: 46 %
Neutro Abs: 5.2 10*3/uL (ref 1.4–6.5)
Platelets: 255 10*3/uL (ref 150–440)
RBC: 4.93 MIL/uL (ref 3.80–5.20)
RDW: 14.1 % (ref 11.5–14.5)
WBC: 11.5 10*3/uL — AB (ref 3.6–11.0)

## 2017-03-27 LAB — BASIC METABOLIC PANEL
ANION GAP: 6 (ref 5–15)
BUN: 27 mg/dL — ABNORMAL HIGH (ref 6–20)
CHLORIDE: 105 mmol/L (ref 101–111)
CO2: 24 mmol/L (ref 22–32)
CREATININE: 1.77 mg/dL — AB (ref 0.44–1.00)
Calcium: 9.1 mg/dL (ref 8.9–10.3)
GFR calc non Af Amer: 29 mL/min — ABNORMAL LOW (ref >60)
GFR, EST AFRICAN AMERICAN: 33 mL/min — AB (ref >60)
Glucose, Bld: 160 mg/dL — ABNORMAL HIGH (ref 65–99)
POTASSIUM: 4 mmol/L (ref 3.5–5.1)
SODIUM: 135 mmol/L (ref 135–145)

## 2017-03-27 LAB — TROPONIN I

## 2017-03-27 NOTE — ED Notes (Signed)
Pt ambulated in room and denies any lightheadedness or dizziness. HR increased to 80 when standing

## 2017-03-27 NOTE — Discharge Instructions (Signed)
Return to ER for any increase in pain, if the pain changes or becomes worse with physical activity, you have shortness of breath, nausea or vomiting associated with the chest pain. ° °

## 2017-03-27 NOTE — ED Provider Notes (Signed)
[facility] Emergency Department Provider Note    First MD Initiated Contact with Patient 03/27/17 1933     (approximate)  I have reviewed the triage vital signs and the nursing notes.   HISTORY  Chief Complaint Bradycardia    HPI Kathryn Kramer is a 67 y.o. female with a history of CHF CKD diabetes and hypertension with recent evaluation in the ER for symptomatic bradycardia with cessation of her carvedilol presents to the ER today because she was checking her heart rate and blood pressure on her home monitor and it said it was 26.  States that she felt "flushed" all day but denied any weakness, chest pain, shortness of breath or palpitations.  Patient called EMS.  On their pulse oximeter her heart rate was read at 26 but on telemetry monitoring once EMS evaluated patient she had a heart rate in the high 60s and low 70s with frequent PVCs.  She denies any pain.  Her blood pressures been stable.  She has not tried any new medications.  Denies any nausea or vomiting.  Past Medical History:  Diagnosis Date  . Acid reflux   . Anxiety   . Arrhythmia   . BP (high blood pressure) 01/25/2013  . CCF (congestive cardiac failure) ([place]) 08/04/2013  . CHF (congestive heart failure) ([place])   . Chronic kidney disease   . Constipation   . Cystocele, grade 2   . Diabetes mellitus type I ([place])   . Diabetes mellitus without complication ([place])   . Female incontinence   . Hypertension   . Recurrent UTI   . Stroke [facility]) 2001   Family History  Problem Relation Age of Onset  . Colon cancer Father   . Colon polyps Brother   . Colon cancer Brother   . Diabetes Mother   . Alcohol abuse Mother   . Heart disease Sister   . Breast cancer Sister   . Alcohol abuse Brother   . Drug abuse Brother   . Alcohol abuse Brother   . Drug abuse Brother   . Alcohol abuse Brother   . Drug abuse Brother   . Alcohol abuse Brother   . Drug abuse Brother   . Alcohol abuse Brother   .  Drug abuse Brother   . Alcohol abuse Brother   . Drug abuse Brother   . Colon polyps Daughter   . Diabetes Daughter   . Kidney disease Neg Hx   . Bladder Cancer Neg Hx   . Ovarian cancer Neg Hx    Past Surgical History:  Procedure Laterality Date  . COLONOSCOPY  02/16/13  . elbow     pinch nerve  . HAND SURGERY Bilateral [id number]   carpel tunnel  . HIP SURGERY Right 1999  . MULTIPLE TOOTH EXTRACTIONS  2014  . TUBAL LIGATION  1985   Patient Active Problem List   Diagnosis Date Noted  . Bradycardia 03/19/2017  . Angioedema 07/26/2016  . Urinary incontinence, mixed 07/06/2015  . Morbid (severe) obesity due to excess calories ([place]) 01/19/2015  . Morbid obesity ([place]) 12/07/2014  . Recurrent UTI 10/29/2014  . Extreme obesity 08/09/2014  . Cystocele, grade 2 08/09/2014  . Atrophic vaginitis 08/09/2014  . Incontinence 08/09/2014  . Compulsive tobacco user syndrome 05/09/2014  . Current tobacco use 05/09/2014  . History of colonic polyps 02/21/2014  . CCF (congestive cardiac failure) ([place]) 08/04/2013  . Chronic kidney disease (CKD), stage III (moderate) (HCC) 01/25/2013  . Diabetes ([place]) 01/25/2013  .  BP (high blood pressure) 01/25/2013  . Angiopathy, peripheral ([place]) 01/25/2013  . Peripheral arterial occlusive disease ([place]) 01/25/2013  . Encounter for screening colonoscopy 12/23/2012  . Rectal bleeding 12/23/2012      Prior to Admission medications   Medication Sig Start Date End Date Taking? Authorizing Provider  amLODipine (NORVASC) 10 MG tablet Take 1 tablet by mouth daily. 12/17/12   [provider]  aspirin 81 MG tablet Take 81 mg by mouth daily.    [provider]  atorvastatin (LIPITOR) 20 MG tablet Take 1 tablet by mouth at bedtime.  12/18/12   [provider]  baclofen (LIORESAL) 10 MG tablet Take 1 tablet by mouth 3 (three) times daily. 12/18/12   [provider]  Calcium Carbonate-Vitamin D (CALCIUM + D PO) Take 2 tablets by  mouth daily.    [provider]  cephALEXin (KEFLEX) 250 MG capsule Take 1 capsule by mouth daily. 03/07/17   [provider]  citalopram (CELEXA) 40 MG tablet Take 1 tablet (40 mg total) by mouth daily. 07/12/15   Rainey Pines, MD  clonazePAM (KLONOPIN) 0.25 MG disintegrating tablet Take 0.25 mg by mouth 2 (two) times daily as needed for anxiety.    [provider]  cloNIDine (CATAPRES) 0.2 MG tablet Take 1 tablet by mouth 2 (two) times daily. 06/20/16   [provider]  furosemide (LASIX) 20 MG tablet Take 1 tablet by mouth 2 (two) times daily.  12/17/12   [provider]  gabapentin (NEURONTIN) 100 MG capsule Take 2 capsules by mouth 2 (two) times a week. 01/13/17   [provider]  glipiZIDE (GLUCOTROL) 5 MG tablet Take 5 mg by mouth 2 (two) times daily before a meal.  12/18/12   [provider]  hydrALAZINE (APRESOLINE) 25 MG tablet Take 1 tablet (25 mg total) by mouth 3 (three) times daily. 07/27/16 07/27/17  Hillary Bow, MD  insulin glargine (LANTUS) 100 UNIT/ML injection Inject 20 Units into the skin daily.    [provider]  mirabegron ER (MYRBETRIQ) 25 MG TB24 tablet Take 1 tablet (25 mg total) by mouth daily. 07/03/15   Zara Council A, PA-C  omeprazole (PRILOSEC) 20 MG capsule Take 2 capsules by mouth daily. 11/25/12   [provider]  predniSONE (DELTASONE) 20 MG tablet Take 1 tablet (20 mg total) by mouth daily with breakfast. 07/28/16   Hillary Bow, MD  PREMARIN vaginal cream Place 1 g vaginally once a week.  07/01/16   [provider]  PROAIR HFA 108 (90 BASE) MCG/ACT inhaler Inhale 2 puffs into the lungs every 8 (eight) hours. 10/22/12   [provider]  solifenacin (VESICARE) 10 MG tablet Take 1 tablet (10 mg total) by mouth daily. 08/09/14   Zara Council A, PA-C  spironolactone (ALDACTONE) 25 MG tablet Take 12.5 mg by mouth daily.  06/20/16   [provider]     Allergies Ace inhibitors and Pravastatin    Social History Social History   Tobacco Use  . Smoking status: Current Every Day Smoker    Packs/day: 0.50    Years: 20.00    Pack years: 10.00    Types: Cigarettes  . Smokeless tobacco: Never Used  Substance Use Topics  . Alcohol use: No    Alcohol/week: 0.0 oz  . Drug use: No    Review of Systems Patient denies headaches, rhinorrhea, blurry vision, numbness, shortness of breath, chest pain, edema, cough, abdominal pain, nausea, vomiting, diarrhea, dysuria, fevers, rashes or hallucinations  unless otherwise stated above in HPI. ____________________________________________   PHYSICAL EXAM:  VITAL SIGNS: Vitals:   03/27/17 2124 03/27/17 2125  BP: (!) 140/94   Pulse:    Resp: 18 (!) 31  Temp:      Constitutional: Alert and oriented. Well appearing and in no acute distress. Eyes: Conjunctivae are normal.  Head: Atraumatic. Nose: No congestion/rhinnorhea. Mouth/Throat: Mucous membranes are moist.   Neck: No stridor. Painless ROM.  Cardiovascular: Normal rate, regular rhythm. Grossly normal heart sounds.  Good peripheral circulation. Respiratory: Normal respiratory effort.  No retractions. Lungs CTAB. Gastrointestinal: Soft and nontender. No distention. No abdominal bruits. No CVA tenderness. Genitourinary:  Musculoskeletal: No lower extremity tenderness nor edema.  No joint effusions. Neurologic:  Normal speech and language. No gross focal neurologic deficits are appreciated. No facial droop Skin:  Skin is warm, dry and intact. No rash noted. Psychiatric: Mood and affect are normal. Speech and behavior are normal.  ____________________________________________   LABS (all labs ordered are listed, but only abnormal results are displayed)  Results for orders placed or performed during the hospital encounter of 03/27/17 (from the past 24 hour(s))  CBC with Differential/Platelet     Status: Abnormal   Collection Time:  03/27/17  7:32 PM  Result Value Ref Range   WBC 11.5 (H) 3.6 - 11.0 K/uL   RBC 4.93 3.80 - 5.20 MIL/uL   Hemoglobin 14.2 12.0 - 16.0 g/dL   HCT 43.6 35.0 - 47.0 %   MCV 88.5 80.0 - 100.0 fL   MCH 28.8 26.0 - 34.0 pg   MCHC 32.5 32.0 - 36.0 g/dL   RDW 14.1 11.5 - 14.5 %   Platelets 255 150 - 440 K/uL   Neutrophils Relative % 46 %   Neutro Abs 5.2 1.4 - 6.5 K/uL   Lymphocytes Relative 39 %   Lymphs Abs 4.5 (H) 1.0 - 3.6 K/uL   Monocytes Relative 10 %   Monocytes Absolute 1.2 (H) 0.2 - 0.9 K/uL   Eosinophils Relative 4 %   Eosinophils Absolute 0.5 0 - 0.7 K/uL   Basophils Relative 1 %   Basophils Absolute 0.1 0 - 0.1 K/uL  Basic metabolic panel     Status: Abnormal   Collection Time: 03/27/17  7:32 PM  Result Value Ref Range   Sodium 135 135 - 145 mmol/L   Potassium 4.0 3.5 - 5.1 mmol/L   Chloride 105 101 - 111 mmol/L   CO2 24 22 - 32 mmol/L   Glucose, Bld 160 (H) 65 - 99 mg/dL   BUN 27 (H) 6 - 20 mg/dL   Creatinine, Ser 1.77 (H) 0.44 - 1.00 mg/dL   Calcium 9.1 8.9 - 10.3 mg/dL   GFR calc non Af Amer 29 (L) >60 mL/min   GFR calc Af Amer 33 (L) >60 mL/min   Anion gap 6 5 - 15  Troponin I     Status: None   Collection Time: 03/27/17  7:32 PM  Result Value Ref Range   Troponin I <0.03 <0.03 ng/mL   ____________________________________________  EKG My review and personal interpretation at Time: 19:27   Indication: bradycardia  Rate: 65  Rhythm: sinus with frequent pvc Axis: normal Other: no stemi ____________________________________________  RADIOLOGY  I personally reviewed all radiographic images ordered to evaluate for the above acute complaints and reviewed radiology reports and findings.  These findings were personally discussed with the patient.  Please see medical record for radiology report.  ____________________________________________   PROCEDURES  Procedure(s) performed:  Procedures    Critical Care performed:  no ____________________________________________   INITIAL IMPRESSION / ASSESSMENT AND PLAN / ED COURSE  Pertinent labs & imaging results that were available during my care of the patient were reviewed by me and considered in my medical decision making (see chart for details).  DDX: bradycardia, electrolyte abn, acs, chf, cardiomyopathy  Kathryn Kramer is a 67 y.o. who presents to the ED with bradycardia at home.  Patient is well-appearing and hemodynamically stable.  Patient is brought in with several strips of cardiac monitoring by EMS.  It does appear that the patient is having occasional PVCs and most frequently PACs that are corresponding with a rate of roughly 25-30.  She is otherwise asymptomatic I do have a high suspicion that this is the rate that those electronic monitors were picking up.  We will check blood work for the above differential.  Keep patient on a monitor.  Clinical Course as of Mar 27 2142  Thu Mar 27, 2017  2100 Reassessed.  Remains Heema dynamically stable.  No chest pain or shortness of breath.  She is been Monitored for over 1 Hour with No Bradycardic Episodes.  She Still Having Occasional PACs and I Do Think That Is Most Likely Explanation for Her Symptoms.  Her Troponin Is Negative.  This Not Clinically Consistent with ACS.  Patient was able to tolerate PO and was able to ambulate with a steady gait.  We discussed options for management including observation for additional time on the monitor versus follow-up with cardiology.  Patient is states she would prefer to follow-up with cardiology.  Have discussed with the patient and available family all diagnostics and treatments performed thus far and all questions were answered to the best of my ability. The patient demonstrates understanding and agreement with plan.   [PR]    Clinical Course User Index [PR] Merlyn Lot, MD     ____________________________________________   FINAL CLINICAL IMPRESSION(S) / ED  DIAGNOSES  Final diagnoses:  PVC (premature ventricular contraction)      NEW MEDICATIONS STARTED DURING THIS VISIT:  Discharge Medication List as of 03/27/2017  9:15 PM       Note:  This document was prepared using Dragon voice recognition software and may include unintentional dictation errors.    Merlyn Lot, MD 03/27/17 2144

## 2017-03-27 NOTE — ED Triage Notes (Signed)
Pt arrives via ems from home. Ems reports pt initial HR 27. EMS reports pt denies any LOC or pain. Pt BP reported as systolic 919. EMS reports pt discharged from hospital 1/17 for same symptoms. Reports medications were changes included stopping carvedilol. Currently complains of feeling lightheaded and dizzy when standing. Pt currently a&o, and in no other apparent distress. Current HR of 74

## 2017-04-04 DIAGNOSIS — E782 Mixed hyperlipidemia: Secondary | ICD-10-CM | POA: Insufficient documentation

## 2017-04-04 DIAGNOSIS — I10 Essential (primary) hypertension: Secondary | ICD-10-CM | POA: Insufficient documentation

## 2017-04-10 ENCOUNTER — Other Ambulatory Visit: Payer: Self-pay | Admitting: Family Medicine

## 2017-04-10 DIAGNOSIS — Z1231 Encounter for screening mammogram for malignant neoplasm of breast: Secondary | ICD-10-CM

## 2017-05-12 ENCOUNTER — Other Ambulatory Visit: Payer: Self-pay | Admitting: Family Medicine

## 2017-05-12 DIAGNOSIS — J449 Chronic obstructive pulmonary disease, unspecified: Secondary | ICD-10-CM

## 2017-05-25 ENCOUNTER — Emergency Department
Admission: EM | Admit: 2017-05-25 | Discharge: 2017-05-25 | Disposition: A | Discharge status: Home / Self Care | Payer: Medicare Other | Attending: Emergency Medicine | Admitting: Emergency Medicine

## 2017-05-25 ENCOUNTER — Other Ambulatory Visit: Payer: Self-pay

## 2017-05-25 ENCOUNTER — Encounter: Payer: Self-pay | Admitting: *Deleted

## 2017-05-25 ENCOUNTER — Emergency Department: Payer: Medicare Other

## 2017-05-25 DIAGNOSIS — N183 Chronic kidney disease, stage 3 (moderate): Secondary | ICD-10-CM | POA: Diagnosis not present

## 2017-05-25 DIAGNOSIS — L049 Acute lymphadenitis, unspecified: Secondary | ICD-10-CM | POA: Diagnosis not present

## 2017-05-25 DIAGNOSIS — Z79899 Other long term (current) drug therapy: Secondary | ICD-10-CM | POA: Diagnosis not present

## 2017-05-25 DIAGNOSIS — Z87891 Personal history of nicotine dependence: Secondary | ICD-10-CM | POA: Diagnosis not present

## 2017-05-25 DIAGNOSIS — R221 Localized swelling, mass and lump, neck: Secondary | ICD-10-CM | POA: Diagnosis present

## 2017-05-25 DIAGNOSIS — Z7982 Long term (current) use of aspirin: Secondary | ICD-10-CM | POA: Diagnosis not present

## 2017-05-25 DIAGNOSIS — E1022 Type 1 diabetes mellitus with diabetic chronic kidney disease: Secondary | ICD-10-CM | POA: Diagnosis not present

## 2017-05-25 DIAGNOSIS — Z794 Long term (current) use of insulin: Secondary | ICD-10-CM | POA: Diagnosis not present

## 2017-05-25 DIAGNOSIS — I130 Hypertensive heart and chronic kidney disease with heart failure and stage 1 through stage 4 chronic kidney disease, or unspecified chronic kidney disease: Secondary | ICD-10-CM | POA: Insufficient documentation

## 2017-05-25 DIAGNOSIS — I509 Heart failure, unspecified: Secondary | ICD-10-CM | POA: Insufficient documentation

## 2017-05-25 MED ORDER — AMOXICILLIN-POT CLAVULANATE 875-125 MG PO TABS: 1.0000 | ORAL_TABLET | Freq: Two times a day (BID) | ORAL | 0 refills | Status: DC

## 2017-05-25 NOTE — Discharge Instructions (Signed)
Your exam and ultrasound reveals a small, normal appearing lymph node on the right side of the neck. Take the antibiotic as directed. Follow-up with your provider for ongoing symptoms.

## 2017-05-25 NOTE — ED Provider Notes (Signed)
[facility] Emergency Department Provider Note ____________________________________________  Time seen: 1505  I have reviewed the triage vital signs and the nursing notes.  HISTORY  Chief Complaint  Abscess  HPI Kathryn Kramer is a 67 y.o. female inserts up to the ED for evaluation of a minimally tender nodule noted to the right neck just under her jaw.  Patient does not describe any significant pain, but she does note some dysphasia when she eats and drinks as well as when she pushes on the area.  He denies any interim fevers, chills, sweats.  She does admit to a recent upper respiratory infection.  She denies any chest pain, shortness of breath, or recent dental procedure.  He has a history of diabetes mellitus, hypertension, and stage III renal failure.  Past Medical History:  Diagnosis Date  . Acid reflux   . Anxiety   . Arrhythmia   . BP (high blood pressure) 01/25/2013  . CCF (congestive cardiac failure) ([place]) 08/04/2013  . CHF (congestive heart failure) ([place])   . Chronic kidney disease   . Constipation   . Cystocele, grade 2   . Diabetes mellitus type I ([place])   . Diabetes mellitus without complication ([place])   . Female incontinence   . Hypertension   . Recurrent UTI   . Stroke [facility]) 2001    Patient Active Problem List   Diagnosis Date Noted  . Bradycardia 03/19/2017  . Angioedema 07/26/2016  . Urinary incontinence, mixed 07/06/2015  . Morbid (severe) obesity due to excess calories ([place]) 01/19/2015  . Morbid obesity ([place]) 12/07/2014  . Recurrent UTI 10/29/2014  . Extreme obesity 08/09/2014  . Cystocele, grade 2 08/09/2014  . Atrophic vaginitis 08/09/2014  . Incontinence 08/09/2014  . Compulsive tobacco user syndrome 05/09/2014  . Current tobacco use 05/09/2014  . History of colonic polyps 02/21/2014  . CCF (congestive cardiac failure) ([place]) 08/04/2013  . Chronic kidney disease (CKD), stage III (moderate) (HCC) 01/25/2013  . Diabetes ([place])  01/25/2013  . BP (high blood pressure) 01/25/2013  . Angiopathy, peripheral ([place]) 01/25/2013  . Peripheral arterial occlusive disease ([place]) 01/25/2013  . Encounter for screening colonoscopy 12/23/2012  . Rectal bleeding 12/23/2012    Past Surgical History:  Procedure Laterality Date  . COLONOSCOPY  02/16/13  . elbow     pinch nerve  . HAND SURGERY Bilateral [id number]   carpel tunnel  . HIP SURGERY Right 1999  . MULTIPLE TOOTH EXTRACTIONS  2014  . TUBAL LIGATION  1985    Prior to Admission medications   Medication Sig Start Date End Date Taking? Authorizing Provider  amLODipine (NORVASC) 10 MG tablet Take 1 tablet by mouth daily. 12/17/12   [provider]  amoxicillin-clavulanate (AUGMENTIN) 875-125 MG tablet Take 1 tablet by mouth 2 (two) times daily. 05/25/17   Countess Biebel, Dannielle Karvonen, PA-C  aspirin 81 MG tablet Take 81 mg by mouth daily.    [provider]  atorvastatin (LIPITOR) 20 MG tablet Take 1 tablet by mouth at bedtime.  12/18/12   [provider]  baclofen (LIORESAL) 10 MG tablet Take 1 tablet by mouth 3 (three) times daily. 12/18/12   [provider]  Calcium Carbonate-Vitamin D (CALCIUM + D PO) Take 2 tablets by mouth daily.    [provider]  cephALEXin (KEFLEX) 250 MG capsule Take 1 capsule by mouth daily. 03/07/17   [provider]  citalopram (CELEXA) 40 MG tablet Take 1 tablet (40 mg total) by mouth daily. 07/12/15  Rainey Pines, MD  clonazePAM (KLONOPIN) 0.25 MG disintegrating tablet Take 0.25 mg by mouth 2 (two) times daily as needed for anxiety.    [provider]  cloNIDine (CATAPRES) 0.2 MG tablet Take 1 tablet by mouth 2 (two) times daily. 06/20/16   [provider]  furosemide (LASIX) 20 MG tablet Take 1 tablet by mouth 2 (two) times daily.  12/17/12   [provider]  gabapentin (NEURONTIN) 100 MG capsule Take 2 capsules by mouth 2 (two) times a week. 01/13/17   [provider]  glipiZIDE (GLUCOTROL) 5 MG tablet Take 5 mg by mouth 2 (two) times daily before a meal.  12/18/12   [provider]  hydrALAZINE (APRESOLINE) 25 MG tablet Take 1 tablet (25 mg total) by mouth 3 (three) times daily. 07/27/16 07/27/17  Hillary Bow, MD  insulin glargine (LANTUS) 100 UNIT/ML injection Inject 20 Units into the skin daily.    [provider]  mirabegron ER (MYRBETRIQ) 25 MG TB24 tablet Take 1 tablet (25 mg total) by mouth daily. 07/03/15   Zara Council A, PA-C  omeprazole (PRILOSEC) 20 MG capsule Take 2 capsules by mouth daily. 11/25/12   [provider]  predniSONE (DELTASONE) 20 MG tablet Take 1 tablet (20 mg total) by mouth daily with breakfast. 07/28/16   Hillary Bow, MD  PREMARIN vaginal cream Place 1 g vaginally once a week.  07/01/16   [provider]  PROAIR HFA 108 (90 BASE) MCG/ACT inhaler Inhale 2 puffs into the lungs every 8 (eight) hours. 10/22/12   [provider]  solifenacin (VESICARE) 10 MG tablet Take 1 tablet (10 mg total) by mouth daily. 08/09/14   Zara Council A, PA-C  spironolactone (ALDACTONE) 25 MG tablet Take 12.5 mg by mouth daily.  06/20/16   [provider]    Allergies Ace inhibitors and Pravastatin  Family History  Problem Relation Age of Onset  . Colon cancer Father   . Colon polyps Brother   . Colon cancer Brother   . Diabetes Mother   . Alcohol abuse Mother   . Heart disease Sister   . Breast cancer Sister   . Alcohol abuse Brother   . Drug abuse Brother   . Alcohol abuse Brother   . Drug abuse Brother   . Alcohol abuse Brother   . Drug abuse Brother   . Alcohol abuse Brother   . Drug abuse Brother   . Alcohol abuse Brother   . Drug abuse Brother   . Alcohol abuse Brother   . Drug abuse Brother   . Colon polyps Daughter   . Diabetes Daughter   . Kidney disease Neg Hx   . Bladder Cancer Neg Hx   . Ovarian cancer Neg Hx     Social History Social History    Tobacco Use  . Smoking status: Former Smoker    Packs/day: 0.50    Years: 20.00    Pack years: 10.00    Types: Cigarettes  . Smokeless tobacco: Never Used  Substance Use Topics  . Alcohol use: No    Alcohol/week: 0.0 oz  . Drug use: No    Review of Systems  Constitutional: Negative for fever. Eyes: Negative for visual changes. ENT: Negative for sore throat. Cardiovascular: Negative for chest pain. Respiratory: Negative for shortness of breath. Gastrointestinal: Negative for abdominal pain, vomiting and diarrhea. Genitourinary: Negative for dysuria. Musculoskeletal: Negative for back pain. Skin: Negative for rash. Palpable right neck nodule as above. Neurological: Negative  for headaches, focal weakness or numbness. ____________________________________________  PHYSICAL EXAM:  VITAL SIGNS: ED Triage Vitals  Enc Vitals Group     BP 05/25/17 1323 (!) 141/75     Pulse Rate 05/25/17 1323 93     Resp 05/25/17 1323 18     Temp 05/25/17 1323 98.7 F (37.1 C)     Temp Source 05/25/17 1323 Oral     SpO2 05/25/17 1323 96 %     Weight 05/25/17 1328 234 lb (106.1 kg)     Height 05/25/17 1328 5\' 1"  (1.549 m)     Head Circumference --      Peak Flow --      Pain Score 05/25/17 1324 0     Pain Loc --      Pain Edu? --      Excl. in [place]? --     Constitutional: Alert and oriented. Well appearing and in no distress. Head: Normocephalic and atraumatic. Eyes: Conjunctivae are normal. PERRL. Normal extraocular movements Ears: Canals clear. TMs intact bilaterally. Nose: No congestion/rhinorrhea/epistaxis. Mouth/Throat: Mucous membranes are moist. Neck: Supple. No thyromegaly. Hematological/Lymphatic/Immunological: Suspected palpable, mobile, soft, nontender cervical lymphadenopathy on the right. Cardiovascular: Normal rate, regular rhythm. Normal distal pulses. Respiratory: Normal respiratory effort. No wheezes/rales/rhonchi. Musculoskeletal: Nontender with normal range of  motion in all extremities.  Neurologic:  Normal gait without ataxia. Normal speech and language. No gross focal neurologic deficits are appreciated. Skin:  Skin is warm, dry and intact. No rash noted. Psychiatric: Mood and affect are normal. Patient exhibits appropriate insight and judgment. ____________________________________________   RADIOLOGY  US Soft Tissue Neck  IMPRESSION: No focal abnormality at the area of concern. No enlarged lymph nodes.  Incidentally, there may be carotid artery calcifications. ____________________________________________  INITIAL IMPRESSION / ASSESSMENT AND PLAN / ED COURSE  Patient with ED evaluation of an acute palpable notch noted to the right neck.  Patient denies any significant pain.  Her ultrasound does reveal that the area of concern likely represents a mildly enlarged lymph node.  Patient will be discharged with a prescription for Augmentin dose as directed.  She is reassured by her ultrasound findings.  She will follow with primary provider or return to the ED as needed. ____________________________________________  FINAL CLINICAL IMPRESSION(S) / ED DIAGNOSES  Final diagnoses:  Adenitis, acute      Carmie End, Dannielle Karvonen, PA-C 05/25/17 2047    Schuyler Amor, MD 05/25/17 2056

## 2017-05-25 NOTE — ED Triage Notes (Signed)
Patient reports feeling a swelling on right side of neck beginning at 1030 am today. Patient denies pain or difficulty swallowing. Patient states she was recently ill with a "head cold."

## 2018-08-18 ENCOUNTER — Other Ambulatory Visit: Payer: Self-pay | Admitting: Family Medicine

## 2018-08-18 DIAGNOSIS — Z1231 Encounter for screening mammogram for malignant neoplasm of breast: Secondary | ICD-10-CM

## 2018-09-02 ENCOUNTER — Other Ambulatory Visit: Payer: Self-pay

## 2018-09-02 DIAGNOSIS — Z86010 Personal history of colonic polyps: Secondary | ICD-10-CM

## 2018-09-02 DIAGNOSIS — Z800 Family history of malignant neoplasm of digestive organs: Secondary | ICD-10-CM

## 2018-09-02 DIAGNOSIS — Z1211 Encounter for screening for malignant neoplasm of colon: Secondary | ICD-10-CM

## 2018-09-03 ENCOUNTER — Telehealth: Payer: Self-pay

## 2018-09-03 NOTE — Telephone Encounter (Signed)
Gastroenterology Pre-Procedure Review  Request Date: 09/14/18 Requesting Physician: Dr. Marius Ditch  PATIENT REVIEW QUESTIONS: The patient responded to the following health history questions as indicated:    1. Are you having any GI issues? no 2. Do you have a personal history of Polyps? yes (2016) 3. Do you have a family history of Colon Cancer or Polyps? yes (father colon cancer) 4. Diabetes Mellitus? yes (yes oral meds) 5. Joint replacements in the past 12 months?no 6. Major health problems in the past 3 months?no 7. Any artificial heart valves, MVP, or defibrillator?no    MEDICATIONS & ALLERGIES:    Patient reports the following regarding taking any anticoagulation/antiplatelet therapy:   Plavix, Coumadin, Eliquis, Xarelto, Lovenox, Pradaxa, Brilinta, or Effient? no Aspirin? yes (81 mg daily)  Patient confirms/reports the following medications:  Current Outpatient Medications  Medication Sig Dispense Refill  . amLODipine (NORVASC) 10 MG tablet Take 1 tablet by mouth daily.    Marland Kitchen amoxicillin-clavulanate (AUGMENTIN) 875-125 MG tablet Take 1 tablet by mouth 2 (two) times daily. 20 tablet 0  . aspirin 81 MG tablet Take 81 mg by mouth daily.    Marland Kitchen atorvastatin (LIPITOR) 20 MG tablet Take 1 tablet by mouth at bedtime.     . baclofen (LIORESAL) 10 MG tablet Take 1 tablet by mouth 3 (three) times daily.    . Calcium Carbonate-Vitamin D (CALCIUM + D PO) Take 2 tablets by mouth daily.    . cephALEXin (KEFLEX) 250 MG capsule Take 1 capsule by mouth daily.    . citalopram (CELEXA) 40 MG tablet Take 1 tablet (40 mg total) by mouth daily. 30 tablet 0  . clonazePAM (KLONOPIN) 0.25 MG disintegrating tablet Take 0.25 mg by mouth 2 (two) times daily as needed for anxiety.    . cloNIDine (CATAPRES) 0.2 MG tablet Take 1 tablet by mouth 2 (two) times daily.    . furosemide (LASIX) 20 MG tablet Take 1 tablet by mouth 2 (two) times daily.     Marland Kitchen gabapentin (NEURONTIN) 100 MG capsule Take 2 capsules by mouth 2  (two) times a week.    Marland Kitchen glipiZIDE (GLUCOTROL) 5 MG tablet Take 5 mg by mouth 2 (two) times daily before a meal.     . hydrALAZINE (APRESOLINE) 25 MG tablet Take 1 tablet (25 mg total) by mouth 3 (three) times daily. 90 tablet 0  . insulin glargine (LANTUS) 100 UNIT/ML injection Inject 20 Units into the skin daily.    . mirabegron ER (MYRBETRIQ) 25 MG TB24 tablet Take 1 tablet (25 mg total) by mouth daily. 30 tablet 12  . omeprazole (PRILOSEC) 20 MG capsule Take 2 capsules by mouth daily.    . predniSONE (DELTASONE) 20 MG tablet Take 1 tablet (20 mg total) by mouth daily with breakfast. 3 tablet 0  . PREMARIN vaginal cream Place 1 g vaginally once a week.     Marland Kitchen PROAIR HFA 108 (90 BASE) MCG/ACT inhaler Inhale 2 puffs into the lungs every 8 (eight) hours.    . solifenacin (VESICARE) 10 MG tablet Take 1 tablet (10 mg total) by mouth daily. 30 tablet 12  . spironolactone (ALDACTONE) 25 MG tablet Take 12.5 mg by mouth daily.      No current facility-administered medications for this visit.     Patient confirms/reports the following allergies:  Allergies  Allergen Reactions  . Ace Inhibitors Swelling  . Pravastatin Itching    Per patient "itching on the bottom of feet"    No orders of the defined  types were placed in this encounter.   AUTHORIZATION INFORMATION Primary Insurance: 1D#: Group #:  Secondary Insurance: 1D#: Group #:  SCHEDULE INFORMATION: Date: 09/14/18 Time: Location:[facility]

## 2018-09-10 ENCOUNTER — Other Ambulatory Visit
Admission: RE | Admit: 2018-09-10 | Discharge: 2018-09-10 | Disposition: A | Discharge status: Home / Self Care | Payer: Medicare Other | Source: Ambulatory Visit | Attending: Gastroenterology | Admitting: Gastroenterology

## 2018-09-10 ENCOUNTER — Other Ambulatory Visit: Payer: Self-pay

## 2018-09-10 DIAGNOSIS — Z01812 Encounter for preprocedural laboratory examination: Secondary | ICD-10-CM | POA: Insufficient documentation

## 2018-09-10 DIAGNOSIS — Z1159 Encounter for screening for other viral diseases: Secondary | ICD-10-CM | POA: Insufficient documentation

## 2018-09-11 ENCOUNTER — Encounter: Payer: Self-pay | Admitting: *Deleted

## 2018-09-11 LAB — SARS CORONAVIRUS 2 (TAT 6-24 HRS): SARS Coronavirus 2: NEGATIVE

## 2018-09-14 ENCOUNTER — Encounter: Payer: Self-pay | Admitting: *Deleted

## 2018-09-14 ENCOUNTER — Ambulatory Visit
Admission: RE | Admit: 2018-09-14 | Discharge: 2018-09-14 | Disposition: A | Discharge status: Home / Self Care | Payer: Medicare Other | Attending: Gastroenterology | Admitting: Gastroenterology

## 2018-09-14 ENCOUNTER — Other Ambulatory Visit: Payer: Self-pay | Admitting: Gastroenterology

## 2018-09-14 ENCOUNTER — Ambulatory Visit: Payer: Medicare Other | Admitting: Certified Registered"

## 2018-09-14 ENCOUNTER — Encounter
Admission: RE | Disposition: A | Discharge status: Home / Self Care | Payer: Self-pay | Source: Home / Self Care | Attending: Gastroenterology

## 2018-09-14 DIAGNOSIS — Z860100 Personal history of colon polyps, unspecified: Secondary | ICD-10-CM

## 2018-09-14 DIAGNOSIS — I509 Heart failure, unspecified: Secondary | ICD-10-CM | POA: Insufficient documentation

## 2018-09-14 DIAGNOSIS — D122 Benign neoplasm of ascending colon: Secondary | ICD-10-CM | POA: Diagnosis not present

## 2018-09-14 DIAGNOSIS — I130 Hypertensive heart and chronic kidney disease with heart failure and stage 1 through stage 4 chronic kidney disease, or unspecified chronic kidney disease: Secondary | ICD-10-CM | POA: Insufficient documentation

## 2018-09-14 DIAGNOSIS — Z8673 Personal history of transient ischemic attack (TIA), and cerebral infarction without residual deficits: Secondary | ICD-10-CM | POA: Diagnosis not present

## 2018-09-14 DIAGNOSIS — F419 Anxiety disorder, unspecified: Secondary | ICD-10-CM | POA: Insufficient documentation

## 2018-09-14 DIAGNOSIS — N189 Chronic kidney disease, unspecified: Secondary | ICD-10-CM | POA: Diagnosis not present

## 2018-09-14 DIAGNOSIS — K5730 Diverticulosis of large intestine without perforation or abscess without bleeding: Secondary | ICD-10-CM | POA: Insufficient documentation

## 2018-09-14 DIAGNOSIS — Z7982 Long term (current) use of aspirin: Secondary | ICD-10-CM | POA: Insufficient documentation

## 2018-09-14 DIAGNOSIS — K219 Gastro-esophageal reflux disease without esophagitis: Secondary | ICD-10-CM | POA: Insufficient documentation

## 2018-09-14 DIAGNOSIS — Z800 Family history of malignant neoplasm of digestive organs: Secondary | ICD-10-CM | POA: Diagnosis not present

## 2018-09-14 DIAGNOSIS — K635 Polyp of colon: Secondary | ICD-10-CM | POA: Diagnosis not present

## 2018-09-14 DIAGNOSIS — Z8371 Family history of colonic polyps: Secondary | ICD-10-CM | POA: Diagnosis not present

## 2018-09-14 DIAGNOSIS — Z87891 Personal history of nicotine dependence: Secondary | ICD-10-CM | POA: Insufficient documentation

## 2018-09-14 DIAGNOSIS — Z833 Family history of diabetes mellitus: Secondary | ICD-10-CM | POA: Insufficient documentation

## 2018-09-14 DIAGNOSIS — Z888 Allergy status to other drugs, medicaments and biological substances status: Secondary | ICD-10-CM | POA: Diagnosis not present

## 2018-09-14 DIAGNOSIS — Z8249 Family history of ischemic heart disease and other diseases of the circulatory system: Secondary | ICD-10-CM | POA: Diagnosis not present

## 2018-09-14 DIAGNOSIS — Z79899 Other long term (current) drug therapy: Secondary | ICD-10-CM | POA: Insufficient documentation

## 2018-09-14 DIAGNOSIS — E1022 Type 1 diabetes mellitus with diabetic chronic kidney disease: Secondary | ICD-10-CM | POA: Insufficient documentation

## 2018-09-14 DIAGNOSIS — Z794 Long term (current) use of insulin: Secondary | ICD-10-CM | POA: Diagnosis not present

## 2018-09-14 HISTORY — DX: Dyspnea, unspecified: R06.00

## 2018-09-14 HISTORY — PX: COLONOSCOPY WITH PROPOFOL: SHX5780

## 2018-09-14 LAB — GLUCOSE, CAPILLARY: Glucose-Capillary: 172 mg/dL — ABNORMAL HIGH (ref 70–99)

## 2018-09-14 SURGERY — COLONOSCOPY WITH PROPOFOL
Anesthesia: General

## 2018-09-14 MED ORDER — SODIUM CHLORIDE 0.9 % IV SOLN
INTRAVENOUS | Status: DC
  Administered 2018-09-14: 09:00:00 1000 mL via INTRAVENOUS
  Administered 2018-09-14: 09:00:00 via INTRAVENOUS

## 2018-09-14 MED ORDER — PEG 3350-KCL-NA BICARB-NACL 420 G PO SOLR: 4000.0000 mL | Freq: Once | ORAL | 0 refills | Status: AC

## 2018-09-14 MED ORDER — PROPOFOL 10 MG/ML IV BOLUS
INTRAVENOUS | Status: DC | PRN
  Administered 2018-09-14 (×2): 20 mg via INTRAVENOUS
  Administered 2018-09-14: 30 mg via INTRAVENOUS
  Administered 2018-09-14 (×12): 20 mg via INTRAVENOUS
  Administered 2018-09-14: 30 mg via INTRAVENOUS
  Administered 2018-09-14 (×2): 20 mg via INTRAVENOUS

## 2018-09-14 NOTE — Anesthesia Post-op Follow-up Note (Signed)
Anesthesia QCDR form completed.        

## 2018-09-14 NOTE — H&P (Signed)
Cephas Darby, MD [address]  [place],  [address]  Main: [phone]  Fax: [phone] Pager: [phone]  Primary Care Physician:  System, Pcp Not In Primary Gastroenterologist:  Dr. Cephas Darby  Pre-Procedure History & Physical: HPI:  Kathryn Kramer is a 68 y.o. female is here for an colonoscopy.   Past Medical History:  Diagnosis Date  . Acid reflux   . Anxiety   . Arrhythmia   . BP (high blood pressure) 01/25/2013  . CCF (congestive cardiac failure) ([place]) 08/04/2013  . CHF (congestive heart failure) ([place])   . Chronic kidney disease   . Constipation   . Cystocele, grade 2   . Diabetes mellitus type I ([place])   . Diabetes mellitus without complication ([place])   . Dyspnea   . Female incontinence   . Hypertension   . Recurrent UTI   . Stroke [facility]) 2001    Past Surgical History:  Procedure Laterality Date  . COLONOSCOPY  02/16/13  . elbow     pinch nerve  . HAND SURGERY Bilateral 2458,0998   carpel tunnel  . HIP SURGERY Right 1999  . MULTIPLE TOOTH EXTRACTIONS  2014  . TUBAL LIGATION  1985    Prior to Admission medications   Medication Sig Start Date End Date Taking? Authorizing Provider  amLODipine (NORVASC) 10 MG tablet Take 1 tablet by mouth daily. 12/17/12   [provider]  amoxicillin-clavulanate (AUGMENTIN) 875-125 MG tablet Take 1 tablet by mouth 2 (two) times daily. 05/25/17   Menshew, Dannielle Karvonen, PA-C  aspirin 81 MG tablet Take 81 mg by mouth daily.    [provider]  atorvastatin (LIPITOR) 20 MG tablet Take 1 tablet by mouth at bedtime.  12/18/12   [provider]  baclofen (LIORESAL) 10 MG tablet Take 1 tablet by mouth 3 (three) times daily. 12/18/12   [provider]  Calcium Carbonate-Vitamin D (CALCIUM + D PO) Take 2 tablets by mouth daily.    [provider]  cephALEXin (KEFLEX) 250 MG capsule Take 1 capsule by mouth daily. 03/07/17   [provider]  citalopram  (CELEXA) 40 MG tablet Take 1 tablet (40 mg total) by mouth daily. 07/12/15   Rainey Pines, MD  clonazePAM (KLONOPIN) 0.25 MG disintegrating tablet Take 0.25 mg by mouth 2 (two) times daily as needed for anxiety.    [provider]  cloNIDine (CATAPRES) 0.2 MG tablet Take 1 tablet by mouth 2 (two) times daily. 06/20/16   [provider]  furosemide (LASIX) 20 MG tablet Take 1 tablet by mouth 2 (two) times daily.  12/17/12   [provider]  gabapentin (NEURONTIN) 100 MG capsule Take 2 capsules by mouth 2 (two) times a week. 01/13/17   [provider]  glipiZIDE (GLUCOTROL) 5 MG tablet Take 5 mg by mouth 2 (two) times daily before a meal.  12/18/12   [provider]  hydrALAZINE (APRESOLINE) 25 MG tablet Take 1 tablet (25 mg total) by mouth 3 (three) times daily. 07/27/16 07/27/17  Hillary Bow, MD  insulin glargine (LANTUS) 100 UNIT/ML injection Inject 20 Units into the skin daily.    [provider]  mirabegron ER (MYRBETRIQ) 25 MG TB24 tablet Take 1 tablet (25 mg total) by mouth daily. 07/03/15   McGowan, Larene Beach A, PA-C  PROAIR HFA 108 (90 BASE) MCG/ACT inhaler Inhale 2 puffs into the lungs every 8 (eight) hours. 10/22/12   [provider]  solifenacin (VESICARE) 10  MG tablet Take 1 tablet (10 mg total) by mouth daily. 08/09/14   Zara Council A, PA-C  spironolactone (ALDACTONE) 25 MG tablet Take 12.5 mg by mouth daily.  06/20/16   [provider]    Allergies as of 09/02/2018 - Review Complete 05/25/2017  Allergen Reaction Noted  . Ace inhibitors Swelling 07/26/2016  . Pravastatin Itching 12/23/2012    Family History  Problem Relation Age of Onset  . Colon cancer Father   . Colon polyps Brother   . Colon cancer Brother   . Diabetes Mother   . Alcohol abuse Mother   . Heart disease Sister   . Breast cancer Sister   . Alcohol abuse Brother   . Drug abuse Brother   . Alcohol abuse Brother   . Drug abuse Brother   .  Alcohol abuse Brother   . Drug abuse Brother   . Alcohol abuse Brother   . Drug abuse Brother   . Alcohol abuse Brother   . Drug abuse Brother   . Alcohol abuse Brother   . Drug abuse Brother   . Colon polyps Daughter   . Diabetes Daughter   . Kidney disease Neg Hx   . Bladder Cancer Neg Hx   . Ovarian cancer Neg Hx     Social History   Socioeconomic History  . Marital status: Married    Spouse name: Not on file  . Number of children: Not on file  . Years of education: Not on file  . Highest education level: Not on file  Occupational History  . Occupation: Retired  Scientific laboratory technician  . Financial resource strain: Not on file  . Food insecurity    Worry: Not on file    Inability: Not on file  . Transportation needs    Medical: Not on file    Non-medical: Not on file  Tobacco Use  . Smoking status: Former Smoker    Packs/day: 0.50    Years: 20.00    Pack years: 10.00    Types: Cigarettes  . Smokeless tobacco: Current User    Types: Snuff  Substance and Sexual Activity  . Alcohol use: No    Alcohol/week: 0.0 standard drinks  . Drug use: No  . Sexual activity: Never    Birth control/protection: None  Lifestyle  . Physical activity    Days per week: Not on file    Minutes per session: Not on file  . Stress: Not on file  Relationships  . Social Herbalist on phone: Not on file    Gets together: Not on file    Attends religious service: Not on file    Active member of club or organization: Not on file    Attends meetings of clubs or organizations: Not on file    Relationship status: Not on file  . Intimate partner violence    Fear of current or ex partner: Not on file    Emotionally abused: Not on file    Physically abused: Not on file    Forced sexual activity: Not on file  Other Topics Concern  . Not on file  Social History Narrative  . Not on file    Review of Systems: See HPI, otherwise negative ROS  Physical Exam: BP (!) 174/106   Pulse  (!) 47   Temp (!) 97.5 F (36.4 C) (Tympanic)   Resp 18   Ht 5\' 1"  (1.549 m)   Wt 103.9 kg   SpO2 95%  BMI 43.27 kg/m  General:   Alert,  pleasant and cooperative in NAD Head:  Normocephalic and atraumatic. Neck:  Supple; no masses or thyromegaly. Lungs:  Clear throughout to auscultation.    Heart:  Regular rate and rhythm. Abdomen:  Soft, nontender and nondistended. Normal bowel sounds, without guarding, and without rebound.   Neurologic:  Alert and  oriented x4;  grossly normal neurologically.  Impression/Plan: Kathryn Kramer is here for an colonoscopy to be performed for h/o colon adenoma  Risks, benefits, limitations, and alternatives regarding  colonoscopy have been reviewed with the patient.  Questions have been answered.  All parties agreeable.   Sherri Sear, MD  09/14/2018, 8:32 AM

## 2018-09-14 NOTE — Transfer of Care (Signed)
Immediate Anesthesia Transfer of Care Note  Patient: Kathryn Kramer  Procedure(s) Performed: COLONOSCOPY WITH PROPOFOL (N/A )  Patient Location: Endoscopy Unit  Anesthesia Type:General  Level of Consciousness: awake and alert   Airway & Oxygen Therapy: Patient Spontanous Breathing and Patient connected to face mask oxygen  Post-op Assessment: Report given to RN and Post -op Vital signs reviewed and stable  Post vital signs: Reviewed and stable  Last Vitals:  Vitals Value Taken Time  BP    Temp    Pulse 83 09/14/18 0958  Resp    SpO2 99 % 09/14/18 0958  Vitals shown include unvalidated device data.  Last Pain:  Vitals:   09/14/18 0809  TempSrc: Tympanic  PainSc: 0-No pain         Complications: No apparent anesthesia complications

## 2018-09-14 NOTE — Anesthesia Preprocedure Evaluation (Addendum)
Anesthesia Evaluation  Patient identified by MRN, date of birth, ID band Patient awake    Reviewed: Allergy & Precautions, NPO status , Patient's Chart, lab work & pertinent test results  Airway Mallampati: III       Dental   Pulmonary shortness of breath and with exertion, former smoker,    Pulmonary exam normal        Cardiovascular hypertension, + Peripheral Vascular Disease and +CHF  Normal cardiovascular exam     Neuro/Psych Anxiety CVA    GI/Hepatic Neg liver ROS, GERD  ,  Endo/Other  diabetes  Renal/GU Renal InsufficiencyRenal disease Bladder dysfunction      Musculoskeletal   Abdominal Normal abdominal exam  (+)   Peds negative pediatric ROS (+)  Hematology negative hematology ROS (+)   Anesthesia Other Findings   Reproductive/Obstetrics                            Anesthesia Physical Anesthesia Plan  ASA: III  Anesthesia Plan: General   Post-op Pain Management:    Induction: Intravenous  PONV Risk Score and Plan:   Airway Management Planned: Nasal Cannula  Additional Equipment:   Intra-op Plan:   Post-operative Plan:   Informed Consent: I have reviewed the patients History and Physical, chart, labs and discussed the procedure including the risks, benefits and alternatives for the proposed anesthesia with the patient or authorized representative who has indicated his/her understanding and acceptance.     Dental advisory given  Plan Discussed with: CRNA and Surgeon  Anesthesia Plan Comments:         Anesthesia Quick Evaluation

## 2018-09-14 NOTE — Anesthesia Postprocedure Evaluation (Signed)
Anesthesia Post Note  Patient: Kathryn Kramer  Procedure(s) Performed: COLONOSCOPY WITH PROPOFOL (N/A )  Patient location during evaluation: Endoscopy Anesthesia Type: General Level of consciousness: awake and alert and oriented Pain management: pain level controlled Vital Signs Assessment: post-procedure vital signs reviewed and stable Respiratory status: spontaneous breathing Cardiovascular status: blood pressure returned to baseline Anesthetic complications: no     Last Vitals:  Vitals:   09/14/18 0957 09/14/18 1007  BP: (!) 155/93 (!) 174/95  Pulse:    Resp:    Temp: (!) 36.1 C   SpO2:      Last Pain:  Vitals:   09/14/18 1007  TempSrc:   PainSc: 0-No pain                 Valerie Cones

## 2018-09-14 NOTE — Op Note (Signed)
[facility] Patient Name: Kathryn Kramer Procedure Date: 09/14/2018 8:48 AM MRN: [id number] Account #: [id number] Date of Birth: 05-24-1950 Admit Type: Outpatient Age: 68 Room: [facility] ENDO ROOM 4 Gender: Female Note Status: Finalized Procedure:            Colonoscopy Indications:          Surveillance: Personal history of adenomatous polyps on                        last colonoscopy 5 years ago, Last colonoscopy: January                        2016 Providers:            Lin Landsman MD, MD Medicines:            Monitored Anesthesia Care Complications:        No immediate complications. Estimated blood loss: None. Procedure:            Pre-Anesthesia Assessment:                       - Prior to the procedure, a History and Physical was                        performed, and patient medications and allergies were                        reviewed. The patient is competent. The risks and                        benefits of the procedure and the sedation options and                        risks were discussed with the patient. All questions                        were answered and informed consent was obtained.                        Patient identification and proposed procedure were                        verified by the physician, the nurse, the                        anesthesiologist, the anesthetist and the technician in                        the pre-procedure area in the procedure room in the                        endoscopy suite. Mental Status Examination: alert and                        oriented. Airway Examination: normal oropharyngeal                        airway and neck mobility. Respiratory Examination:  clear to auscultation. CV Examination: normal.                        Prophylactic Antibiotics: The patient does not require                        prophylactic antibiotics. Prior Anticoagulants: The                         patient has taken no previous anticoagulant or                        antiplatelet agents. ASA Grade Assessment: III - A                        patient with severe systemic disease. After reviewing                        the risks and benefits, the patient was deemed in                        satisfactory condition to undergo the procedure. The                        anesthesia plan was to use monitored anesthesia care                        (MAC). Immediately prior to administration of                        medications, the patient was re-assessed for adequacy                        to receive sedatives. The heart rate, respiratory rate,                        oxygen saturations, blood pressure, adequacy of                        pulmonary ventilation, and response to care were                        monitored throughout the procedure. The physical status                        of the patient was re-assessed after the procedure.                       After obtaining informed consent, the colonoscope was                        passed under direct vision. Throughout the procedure,                        the patient's blood pressure, pulse, and oxygen                        saturations were monitored continuously. The                        Colonoscope  was introduced through the anus and                        advanced to the the cecum, identified by appendiceal                        orifice and ileocecal valve. The colonoscopy was                        extremely difficult due to multiple diverticula in the                        colon, significant looping and the patient's body                        habitus. Successful completion of the procedure was                        aided by increasing the dose of sedation medication,                        withdrawing the scope and replacing with the pediatric                        colonoscope, applying abdominal pressure and lavage.                         The patient tolerated the procedure fairly well. The                        quality of the bowel preparation was poor. Findings:      The perianal and digital rectal examinations were normal. Pertinent       negatives include normal sphincter tone and no palpable rectal lesions.      A 6 mm polyp was found in the ascending colon. The polyp was sessile.       The polyp was removed with a hot snare. Resection and retrieval were       complete.      Copious quantities of semi-liquid stool was found in the entire colon,       precluding visualization. Lavage of the area was performed using 200 -       500 mL of sterile water, resulting in incomplete clearance with       continued poor visualization.      The retroflexed view of the distal rectum and anal verge was normal and       showed no anal or rectal abnormalities. Impression:           - Preparation of the colon was poor.                       - One 6 mm polyp in the ascending colon, removed with a                        hot snare. Resected and retrieved.                       - Stool in the entire examined colon.                       -  The distal rectum and anal verge are normal on                        retroflexion view. Recommendation:       - Discharge patient to home (with escort).                       - Clear liquid diet today.                       - Continue present medications.                       - Await pathology results.                       - Repeat colonoscopy tomorrow because the bowel                        preparation was poor if agreeable with patient. Procedure Code(s):    --- Professional ---                       [phone], Colonoscopy, flexible; with removal of tumor(s),                        polyp(s), or other lesion(s) by snare technique Diagnosis Code(s):    --- Professional ---                       Z86.010, Personal history of colonic polyps                       K63.5, Polyp of  colon CPT copyright 2019 American Medical Association. All rights reserved. The codes documented in this report are preliminary and upon coder review may  be revised to meet current compliance requirements. Dr. Ulyess Mort Lin Landsman MD, MD 09/14/2018 9:57:11 AM This report has been signed electronically. Number of Addenda: 0 Note Initiated On: 09/14/2018 8:48 AM Scope Withdrawal Time: 0 hours 27 minutes 59 seconds  Total Procedure Duration: 0 hours 48 minutes 22 seconds  Estimated Blood Loss: Estimated blood loss: none.      [facility]

## 2018-09-15 ENCOUNTER — Encounter: Payer: Self-pay | Admitting: Gastroenterology

## 2018-09-15 ENCOUNTER — Ambulatory Visit
Admission: RE | Admit: 2018-09-15 | Discharge: 2018-09-15 | Disposition: A | Discharge status: Home / Self Care | Payer: Medicare Other | Attending: Gastroenterology | Admitting: Gastroenterology

## 2018-09-15 ENCOUNTER — Ambulatory Visit: Payer: Medicare Other | Admitting: Certified Registered Nurse Anesthetist

## 2018-09-15 ENCOUNTER — Encounter
Admission: RE | Disposition: A | Discharge status: Home / Self Care | Payer: Self-pay | Source: Home / Self Care | Attending: Gastroenterology

## 2018-09-15 DIAGNOSIS — Z79899 Other long term (current) drug therapy: Secondary | ICD-10-CM | POA: Diagnosis not present

## 2018-09-15 DIAGNOSIS — Z87440 Personal history of urinary (tract) infections: Secondary | ICD-10-CM | POA: Diagnosis not present

## 2018-09-15 DIAGNOSIS — N189 Chronic kidney disease, unspecified: Secondary | ICD-10-CM | POA: Diagnosis not present

## 2018-09-15 DIAGNOSIS — Z86010 Personal history of colonic polyps: Secondary | ICD-10-CM | POA: Diagnosis not present

## 2018-09-15 DIAGNOSIS — Z800 Family history of malignant neoplasm of digestive organs: Secondary | ICD-10-CM | POA: Diagnosis not present

## 2018-09-15 DIAGNOSIS — E1122 Type 2 diabetes mellitus with diabetic chronic kidney disease: Secondary | ICD-10-CM | POA: Diagnosis not present

## 2018-09-15 DIAGNOSIS — I509 Heart failure, unspecified: Secondary | ICD-10-CM | POA: Diagnosis not present

## 2018-09-15 DIAGNOSIS — I493 Ventricular premature depolarization: Secondary | ICD-10-CM | POA: Insufficient documentation

## 2018-09-15 DIAGNOSIS — D122 Benign neoplasm of ascending colon: Secondary | ICD-10-CM | POA: Diagnosis not present

## 2018-09-15 DIAGNOSIS — Z7982 Long term (current) use of aspirin: Secondary | ICD-10-CM | POA: Diagnosis not present

## 2018-09-15 DIAGNOSIS — Z8249 Family history of ischemic heart disease and other diseases of the circulatory system: Secondary | ICD-10-CM | POA: Insufficient documentation

## 2018-09-15 DIAGNOSIS — D120 Benign neoplasm of cecum: Secondary | ICD-10-CM | POA: Diagnosis not present

## 2018-09-15 DIAGNOSIS — K5900 Constipation, unspecified: Secondary | ICD-10-CM | POA: Insufficient documentation

## 2018-09-15 DIAGNOSIS — F419 Anxiety disorder, unspecified: Secondary | ICD-10-CM | POA: Insufficient documentation

## 2018-09-15 DIAGNOSIS — Z6841 Body Mass Index (BMI) 40.0 and over, adult: Secondary | ICD-10-CM | POA: Insufficient documentation

## 2018-09-15 DIAGNOSIS — K644 Residual hemorrhoidal skin tags: Secondary | ICD-10-CM | POA: Diagnosis not present

## 2018-09-15 DIAGNOSIS — Z1211 Encounter for screening for malignant neoplasm of colon: Secondary | ICD-10-CM | POA: Insufficient documentation

## 2018-09-15 DIAGNOSIS — K635 Polyp of colon: Secondary | ICD-10-CM

## 2018-09-15 DIAGNOSIS — Z803 Family history of malignant neoplasm of breast: Secondary | ICD-10-CM | POA: Insufficient documentation

## 2018-09-15 DIAGNOSIS — D123 Benign neoplasm of transverse colon: Secondary | ICD-10-CM | POA: Insufficient documentation

## 2018-09-15 DIAGNOSIS — Z833 Family history of diabetes mellitus: Secondary | ICD-10-CM | POA: Insufficient documentation

## 2018-09-15 DIAGNOSIS — Z811 Family history of alcohol abuse and dependence: Secondary | ICD-10-CM | POA: Insufficient documentation

## 2018-09-15 DIAGNOSIS — E1151 Type 2 diabetes mellitus with diabetic peripheral angiopathy without gangrene: Secondary | ICD-10-CM | POA: Diagnosis not present

## 2018-09-15 DIAGNOSIS — K219 Gastro-esophageal reflux disease without esophagitis: Secondary | ICD-10-CM | POA: Insufficient documentation

## 2018-09-15 DIAGNOSIS — I130 Hypertensive heart and chronic kidney disease with heart failure and stage 1 through stage 4 chronic kidney disease, or unspecified chronic kidney disease: Secondary | ICD-10-CM | POA: Insufficient documentation

## 2018-09-15 DIAGNOSIS — Z794 Long term (current) use of insulin: Secondary | ICD-10-CM | POA: Diagnosis not present

## 2018-09-15 DIAGNOSIS — Z8371 Family history of colonic polyps: Secondary | ICD-10-CM | POA: Insufficient documentation

## 2018-09-15 DIAGNOSIS — Z8489 Family history of other specified conditions: Secondary | ICD-10-CM | POA: Insufficient documentation

## 2018-09-15 DIAGNOSIS — I69398 Other sequelae of cerebral infarction: Secondary | ICD-10-CM | POA: Insufficient documentation

## 2018-09-15 DIAGNOSIS — Z888 Allergy status to other drugs, medicaments and biological substances status: Secondary | ICD-10-CM | POA: Insufficient documentation

## 2018-09-15 HISTORY — PX: COLONOSCOPY WITH PROPOFOL: SHX5780

## 2018-09-15 LAB — GLUCOSE, CAPILLARY: Glucose-Capillary: 130 mg/dL — ABNORMAL HIGH (ref 70–99)

## 2018-09-15 SURGERY — COLONOSCOPY WITH PROPOFOL
Anesthesia: General

## 2018-09-15 MED ORDER — SODIUM CHLORIDE 0.9 % IV SOLN
INTRAVENOUS | Status: DC
  Administered 2018-09-15: 14:00:00 1000 mL via INTRAVENOUS

## 2018-09-15 MED ORDER — PROPOFOL 10 MG/ML IV BOLUS
INTRAVENOUS | Status: DC | PRN
  Administered 2018-09-15: 70 mg via INTRAVENOUS

## 2018-09-15 MED ORDER — PROPOFOL 500 MG/50ML IV EMUL
INTRAVENOUS | Status: AC
  Filled 2018-09-15: qty 100

## 2018-09-15 MED ORDER — PROPOFOL 500 MG/50ML IV EMUL
INTRAVENOUS | Status: DC | PRN
  Administered 2018-09-15: 100 ug/kg/min via INTRAVENOUS

## 2018-09-15 NOTE — Transfer of Care (Signed)
Immediate Anesthesia Transfer of Care Note  Patient: Jesyka E Wands  Procedure(s) Performed: COLONOSCOPY WITH PROPOFOL (N/A )  Patient Location: PACU  Anesthesia Type:General  Level of Consciousness: sedated  Airway & Oxygen Therapy: Patient Spontanous Breathing and Patient connected to nasal cannula oxygen  Post-op Assessment: Report given to RN and Post -op Vital signs reviewed and stable  Post vital signs: Reviewed and stable  Last Vitals:  Vitals Value Taken Time  BP 148/85 09/15/18 1543  Temp 36.2 C 09/15/18 1543  Pulse 106 09/15/18 1543  Resp 19 09/15/18 1544  SpO2 98 % 09/15/18 1543  Vitals shown include unvalidated device data.  Last Pain:  Vitals:   09/15/18 1543  TempSrc: Temporal  PainSc:          Complications: No apparent anesthesia complications

## 2018-09-15 NOTE — Anesthesia Post-op Follow-up Note (Signed)
Anesthesia QCDR form completed.        

## 2018-09-15 NOTE — Anesthesia Preprocedure Evaluation (Addendum)
Anesthesia Evaluation  Patient identified by MRN, date of birth, ID band Patient awake    Reviewed: Allergy & Precautions, H&P , NPO status , Patient's Chart, lab work & pertinent test results  Airway Mallampati: III  TM Distance: >3 FB Neck ROM: full    Dental  (+) Edentulous Lower, Edentulous Upper   Pulmonary neg shortness of breath, COPD, neg recent URI, former smoker,           Cardiovascular hypertension, + Peripheral Vascular Disease and +CHF (diastolic)  (-) Past MI and (-) Cardiac Stents + dysrhythmias (PVC's) Atrial Fibrillation   - Left ventricle: Wall thickness was increased in a pattern of mild   LVH. Systolic function was normal. The estimated ejection   fraction was in the range of 55% to 60%. Doppler parameters are   consistent with abnormal left ventricular relaxation (grade 1   diastolic dysfunction). - Aortic valve: Valve area (VTI): 1.54 cm^2. Valve area (Vmax):   1.56 cm^2. Valve area (Vmean): 1.54 cm^2. - Right ventricle: The cavity size was mildly dilated.   Neuro/Psych Anxiety CVA (numbness R side), Residual Symptoms    GI/Hepatic Neg liver ROS, GERD  Controlled,  Endo/Other  diabetesMorbid obesity  Renal/GU CRFnegative Renal ROS  negative genitourinary   Musculoskeletal   Abdominal   Peds  Hematology negative hematology ROS (+)   Anesthesia Other Findings Past Medical History: No date: Acid reflux No date: Anxiety No date: Arrhythmia 01/25/2013: BP (high blood pressure) 08/04/2013: CCF (congestive cardiac failure) ([facility]) No date: CHF (congestive heart failure) ([place]) No date: Chronic kidney disease No date: Constipation No date: Cystocele, grade 2 No date: Diabetes mellitus type I ([place]) No date: Diabetes mellitus without complication ([place]) No date: Dyspnea No date: Female incontinence No date: Hypertension No date: Recurrent UTI 2001: Stroke [facility])  Past Surgical History: 02/16/13:  COLONOSCOPY 09/14/2018: COLONOSCOPY WITH PROPOFOL; N/A     Comment:  Procedure: COLONOSCOPY WITH PROPOFOL;  Surgeon: Lin Landsman, MD;  Location: [facility] ENDOSCOPY;  Service:               Gastroenterology;  Laterality: N/A; No date: elbow     Comment:  pinch nerve 4650,3546: HAND SURGERY; Bilateral     Comment:  carpel tunnel 1999: HIP SURGERY; Right 2014: MULTIPLE TOOTH EXTRACTIONS 1985: TUBAL LIGATION  BMI    Body Mass Index: 43.08 kg/m      Reproductive/Obstetrics negative OB ROS                            Anesthesia Physical Anesthesia Plan  ASA: III  Anesthesia Plan: General   Post-op Pain Management:    Induction:   PONV Risk Score and Plan: Propofol infusion and TIVA  Airway Management Planned: Natural Airway and Nasal Cannula  Additional Equipment:   Intra-op Plan:   Post-operative Plan:   Informed Consent: I have reviewed the patients History and Physical, chart, labs and discussed the procedure including the risks, benefits and alternatives for the proposed anesthesia with the patient or authorized representative who has indicated his/her understanding and acceptance.     Dental Advisory Given  Plan Discussed with: Anesthesiologist and CRNA  Anesthesia Plan Comments:         Anesthesia Quick Evaluation

## 2018-09-15 NOTE — Op Note (Signed)
[facility] Gastroenterology Patient Name: Kathryn Kramer Procedure Date: 09/15/2018 2:25 PM MRN: [id number] Account #: [id number] Date of Birth: 1951/02/11 Admit Type: Outpatient Age: 68 Room: [facility] ENDO ROOM 4 Gender: Female Note Status: Finalized Procedure:            Colonoscopy Indications:          Surveillance: Personal history of adenomatous polyps on                        last colonoscopy 5 years ago, Surveillance: Personal                        history of adenomatous polyps, inadequate prep on last                        colonoscopy (less than 1 year ago), Last colonoscopy:                        July 2020 Providers:            Lin Landsman MD, MD Medicines:            Monitored Anesthesia Care Complications:        No immediate complications. Estimated blood loss: None. Procedure:            Pre-Anesthesia Assessment:                       - Prior to the procedure, a History and Physical was                        performed, and patient medications and allergies were                        reviewed. The patient is competent. The risks and                        benefits of the procedure and the sedation options and                        risks were discussed with the patient. All questions                        were answered and informed consent was obtained.                        Patient identification and proposed procedure were                        verified by the physician, the nurse, the                        anesthesiologist, the anesthetist and the technician in                        the pre-procedure area in the procedure room in the                        endoscopy suite. Mental Status Examination: alert and  oriented. Airway Examination: normal oropharyngeal                        airway and neck mobility. Respiratory Examination:                        clear to auscultation. CV Examination: normal.            Prophylactic Antibiotics: The patient does not require                        prophylactic antibiotics. Prior Anticoagulants: The                        patient has taken no previous anticoagulant or                        antiplatelet agents. ASA Grade Assessment: III - A                        patient with severe systemic disease. After reviewing                        the risks and benefits, the patient was deemed in                        satisfactory condition to undergo the procedure. The                        anesthesia plan was to use monitored anesthesia care                        (MAC). Immediately prior to administration of                        medications, the patient was re-assessed for adequacy                        to receive sedatives. The heart rate, respiratory rate,                        oxygen saturations, blood pressure, adequacy of                        pulmonary ventilation, and response to care were                        monitored throughout the procedure. The physical status                        of the patient was re-assessed after the procedure.                       After obtaining informed consent, the colonoscope was                        passed under direct vision. Throughout the procedure,                        the patient's blood pressure, pulse, and oxygen  saturations were monitored continuously. The                        Colonoscope was introduced through the anus and                        advanced to the the cecum, identified by appendiceal                        orifice and ileocecal valve. The colonoscopy was                        technically difficult and complex due to significant                        looping and the patient's body habitus. Successful                        completion of the procedure was aided by changing the                        patient to a prone position and applying abdominal                         pressure. The patient tolerated the procedure well. The                        quality of the bowel preparation was evaluated using                        the BBPS [facility] Bowel Preparation Scale) with scores                        of: Right Colon = 2 (minor amount of residual staining,                        small fragments of stool and/or opaque liquid, but                        mucosa seen well), Transverse Colon = 3 (entire mucosa                        seen well with no residual staining, small fragments of                        stool or opaque liquid) and Left Colon = 3 (entire                        mucosa seen well with no residual staining, small                        fragments of stool or opaque liquid). The total BBPS                        score equals 8. Findings:      Exam performed in prone position      Hemorrhoids were found on perianal exam.      Three sessile polyps were found in the transverse colon, ascending  colon       and cecum. The polyps were 5 to 6 mm in size. These polyps were removed       with a cold snare. Resection and retrieval were complete.      Non-bleeding external hemorrhoids were found during endoscopy. The       hemorrhoids were medium-sized.      The exam was otherwise without abnormality. Impression:           - Hemorrhoids found on perianal exam.                       - Three 5 to 6 mm polyps in the transverse colon, in                        the ascending colon and in the cecum, removed with a                        cold snare. Resected and retrieved.                       - Non-bleeding external hemorrhoids.                       - The examination was otherwise normal. Recommendation:       - Discharge patient to home (with escort).                       - Resume previous diet today.                       - High fiber diet.                       - Continue present medications.                       - Await pathology  results.                       - Repeat colonoscopy in 5 years for surveillance. Procedure Code(s):    --- Professional ---                       [phone], Colonoscopy, flexible; with removal of tumor(s),                        polyp(s), or other lesion(s) by snare technique Diagnosis Code(s):    --- Professional ---                       K64.4, Residual hemorrhoidal skin tags                       K63.5, Polyp of colon                       Z86.010, Personal history of colonic polyps CPT copyright 2019 American Medical Association. All rights reserved. The codes documented in this report are preliminary and upon coder review may  be revised to meet current compliance requirements. Dr. Ulyess Mort Lin Landsman MD, MD 09/15/2018 3:40:30 PM This report has been signed electronically. Number of Addenda: 0 Note Initiated On: 09/15/2018 2:25 PM Scope  Withdrawal Time: 0 hours 24 minutes 21 seconds  Total Procedure Duration: 0 hours 37 minutes 11 seconds  Estimated Blood Loss: Estimated blood loss: none.      [facility]

## 2018-09-15 NOTE — H&P (Signed)
Cephas Darby, MD [address]  [place]  [place], [place] [address]  Main: [phone]  Fax: [phone] Pager: [phone]  Primary Care Physician:  [facility], [facility] Primary Gastroenterologist:  Dr. Cephas Darby  Pre-Procedure History & Physical: HPI:  Kathryn Kramer is a 68 y.o. female is here for an colonoscopy.   Past Medical History:  Diagnosis Date  . Acid reflux   . Anxiety   . Arrhythmia   . BP (high blood pressure) 01/25/2013  . CCF (congestive cardiac failure) ([place]) 08/04/2013  . CHF (congestive heart failure) ([place])   . Chronic kidney disease   . Constipation   . Cystocele, grade 2   . Diabetes mellitus type I ([place])   . Diabetes mellitus without complication ([place])   . Dyspnea   . Female incontinence   . Hypertension   . Recurrent UTI   . Stroke [facility]) 2001    Past Surgical History:  Procedure Laterality Date  . COLONOSCOPY  02/16/13  . COLONOSCOPY WITH PROPOFOL N/A 09/14/2018   Procedure: COLONOSCOPY WITH PROPOFOL;  Surgeon: Lin Landsman, MD;  Location: [facility] ENDOSCOPY;  Service: Gastroenterology;  Laterality: N/A;  . elbow     pinch nerve  . HAND SURGERY Bilateral [id number]   carpel tunnel  . HIP SURGERY Right 1999  . MULTIPLE TOOTH EXTRACTIONS  2014  . TUBAL LIGATION  1985    Prior to Admission medications   Medication Sig Start Date End Date Taking? Authorizing Provider  amLODipine (NORVASC) 10 MG tablet Take 1 tablet by mouth daily. 12/17/12  Yes [provider]  amoxicillin-clavulanate (AUGMENTIN) 875-125 MG tablet Take 1 tablet by mouth 2 (two) times daily. 05/25/17  Yes Menshew, Dannielle Karvonen, PA-C  aspirin 81 MG tablet Take 81 mg by mouth daily.   Yes [provider]  atorvastatin (LIPITOR) 20 MG tablet Take 1 tablet by mouth at bedtime.  12/18/12  Yes [provider]  baclofen (LIORESAL) 10 MG tablet Take 1 tablet by mouth 3 (three) times daily. 12/18/12  Yes [provider]   Calcium Carbonate-Vitamin D (CALCIUM + D PO) Take 2 tablets by mouth daily.   Yes [provider]  cephALEXin (KEFLEX) 250 MG capsule Take 1 capsule by mouth daily. 03/07/17  Yes [provider]  citalopram (CELEXA) 40 MG tablet Take 1 tablet (40 mg total) by mouth daily. 07/12/15  Yes Rainey Pines, MD  clonazePAM (KLONOPIN) 0.25 MG disintegrating tablet Take 0.25 mg by mouth 2 (two) times daily as needed for anxiety.   Yes [provider]  cloNIDine (CATAPRES) 0.2 MG tablet Take 1 tablet by mouth 2 (two) times daily. 06/20/16  Yes [provider]  furosemide (LASIX) 20 MG tablet Take 1 tablet by mouth 2 (two) times daily.  12/17/12  Yes [provider]  gabapentin (NEURONTIN) 100 MG capsule Take 2 capsules by mouth 2 (two) times a week. 01/13/17  Yes [provider]  insulin glargine (LANTUS) 100 UNIT/ML injection Inject 20 Units into the skin daily.   Yes [provider]  mirabegron ER (MYRBETRIQ) 25 MG TB24 tablet Take 1 tablet (25 mg total) by mouth daily. 07/03/15  Yes McGowan, Shannon A, PA-C  PROAIR HFA 108 (90 BASE) MCG/ACT inhaler Inhale 2 puffs into the lungs every 8 (eight) hours. 10/22/12  Yes [provider]  solifenacin (VESICARE) 10 MG tablet Take 1 tablet (10 mg total) by mouth daily. 08/09/14  Yes McGowan, Larene [place] A, PA-C  spironolactone (  ALDACTONE) 25 MG tablet Take 12.5 mg by mouth daily.  06/20/16  Yes [provider]  glipiZIDE (GLUCOTROL) 5 MG tablet Take 5 mg by mouth 2 (two) times daily before a meal.  12/18/12   [provider]  hydrALAZINE (APRESOLINE) 25 MG tablet Take 1 tablet (25 mg total) by mouth 3 (three) times daily. 07/27/16 07/27/17  Hillary Bow, MD    Allergies as of 09/14/2018 - Review Complete 09/14/2018  Allergen Reaction Noted  . Ace inhibitors Swelling 07/26/2016  . Pravastatin Itching 12/23/2012    Family History  Problem Relation Age of Onset  . Colon cancer Father   .  Colon polyps Brother   . Colon cancer Brother   . Diabetes Mother   . Alcohol abuse Mother   . Heart disease Sister   . Breast cancer Sister   . Alcohol abuse Brother   . Drug abuse Brother   . Alcohol abuse Brother   . Drug abuse Brother   . Alcohol abuse Brother   . Drug abuse Brother   . Alcohol abuse Brother   . Drug abuse Brother   . Alcohol abuse Brother   . Drug abuse Brother   . Alcohol abuse Brother   . Drug abuse Brother   . Colon polyps Daughter   . Diabetes Daughter   . Kidney disease Neg Hx   . Bladder Cancer Neg Hx   . Ovarian cancer Neg Hx     Social History   Socioeconomic History  . Marital status: Married    Spouse name: Not on file  . Number of children: Not on file  . Years of education: Not on file  . Highest education level: Not on file  Occupational History  . Occupation: Retired  Scientific laboratory technician  . Financial resource strain: Not on file  . Food insecurity    Worry: Not on file    Inability: Not on file  . Transportation needs    Medical: Not on file    Non-medical: Not on file  Tobacco Use  . Smoking status: Former Smoker    Packs/day: 0.50    Years: 20.00    Pack years: 10.00    Types: Cigarettes    Quit date: 09/15/2015    Years since quitting: 3.0  . Smokeless tobacco: Current User    Types: Snuff  Substance and Sexual Activity  . Alcohol use: No    Alcohol/week: 0.0 standard drinks  . Drug use: No  . Sexual activity: Never    Birth control/protection: None  Lifestyle  . Physical activity    Days per week: Not on file    Minutes per session: Not on file  . Stress: Not on file  Relationships  . Social Herbalist on phone: Not on file    Gets together: Not on file    Attends religious service: Not on file    Active member of club or organization: Not on file    Attends meetings of clubs or organizations: Not on file    Relationship status: Not on file  . Intimate partner violence    Fear of current or ex partner:  Not on file    Emotionally abused: Not on file    Physically abused: Not on file    Forced sexual activity: Not on file  Other Topics Concern  . Not on file  Social History Narrative  . Not on file    Review of Systems: See HPI, otherwise  negative ROS  Physical Exam: BP (!) 173/117   Pulse 61   Temp 97.7 F (36.5 C) (Tympanic)   Resp 17   Ht 5\' 1"  (1.549 m)   Wt 103.4 kg   SpO2 99%   BMI 43.08 kg/m  General:   Alert,  pleasant and cooperative in NAD Head:  Normocephalic and atraumatic. Neck:  Supple; no masses or thyromegaly. Lungs:  Clear throughout to auscultation.    Heart:  Regular rate and rhythm. Abdomen:  Soft, nontender and nondistended. Normal bowel sounds, without guarding, and without rebound.   Neurologic:  Alert and  oriented x4;  grossly normal neurologically.  Impression/Plan: Kathryn Kramer is here for an colonoscopy to be performed for h/o colon polyp  Risks, benefits, limitations, and alternatives regarding  colonoscopy have been reviewed with the patient.  Questions have been answered.  All parties agreeable.   Sherri Sear, MD  09/15/2018, 2:26 PM

## 2018-09-16 ENCOUNTER — Encounter: Payer: Self-pay | Admitting: Gastroenterology

## 2018-09-16 LAB — SURGICAL PATHOLOGY

## 2018-09-16 NOTE — Anesthesia Postprocedure Evaluation (Signed)
Anesthesia Post Note  Patient: Kathryn Kramer  Procedure(s) Performed: COLONOSCOPY WITH PROPOFOL (N/A )  Patient location during evaluation: PACU Anesthesia Type: General Level of consciousness: awake and alert Pain management: pain level controlled Vital Signs Assessment: post-procedure vital signs reviewed and stable Respiratory status: spontaneous breathing, nonlabored ventilation and respiratory function stable Cardiovascular status: blood pressure returned to baseline and stable Postop Assessment: no apparent nausea or vomiting Anesthetic complications: no     Last Vitals:  Vitals:   09/15/18 1602 09/15/18 1612  BP: (!) 168/102 (!) 157/106  Pulse: 86   Resp:    Temp:    SpO2: 100%     Last Pain:  Vitals:   09/15/18 1543  TempSrc: Temporal  PainSc:                  Durenda Hurt

## 2018-09-17 LAB — SURGICAL PATHOLOGY

## 2018-09-21 ENCOUNTER — Encounter: Payer: Self-pay | Admitting: Gastroenterology

## 2018-09-28 ENCOUNTER — Encounter: Payer: Self-pay | Admitting: General Surgery

## 2018-12-30 IMAGING — CR DG CHEST 2V
1 series · 2 of 2 positions shown · non-contrast
Comparison: 04/26/2014

CLINICAL DATA: Acute presentation with bradycardia.

EXAM:
CHEST  2 VIEW

[Series 1: dg chest 2 view · 0.14mm/px · 2 of 2 slices shown]
[im 1/2]
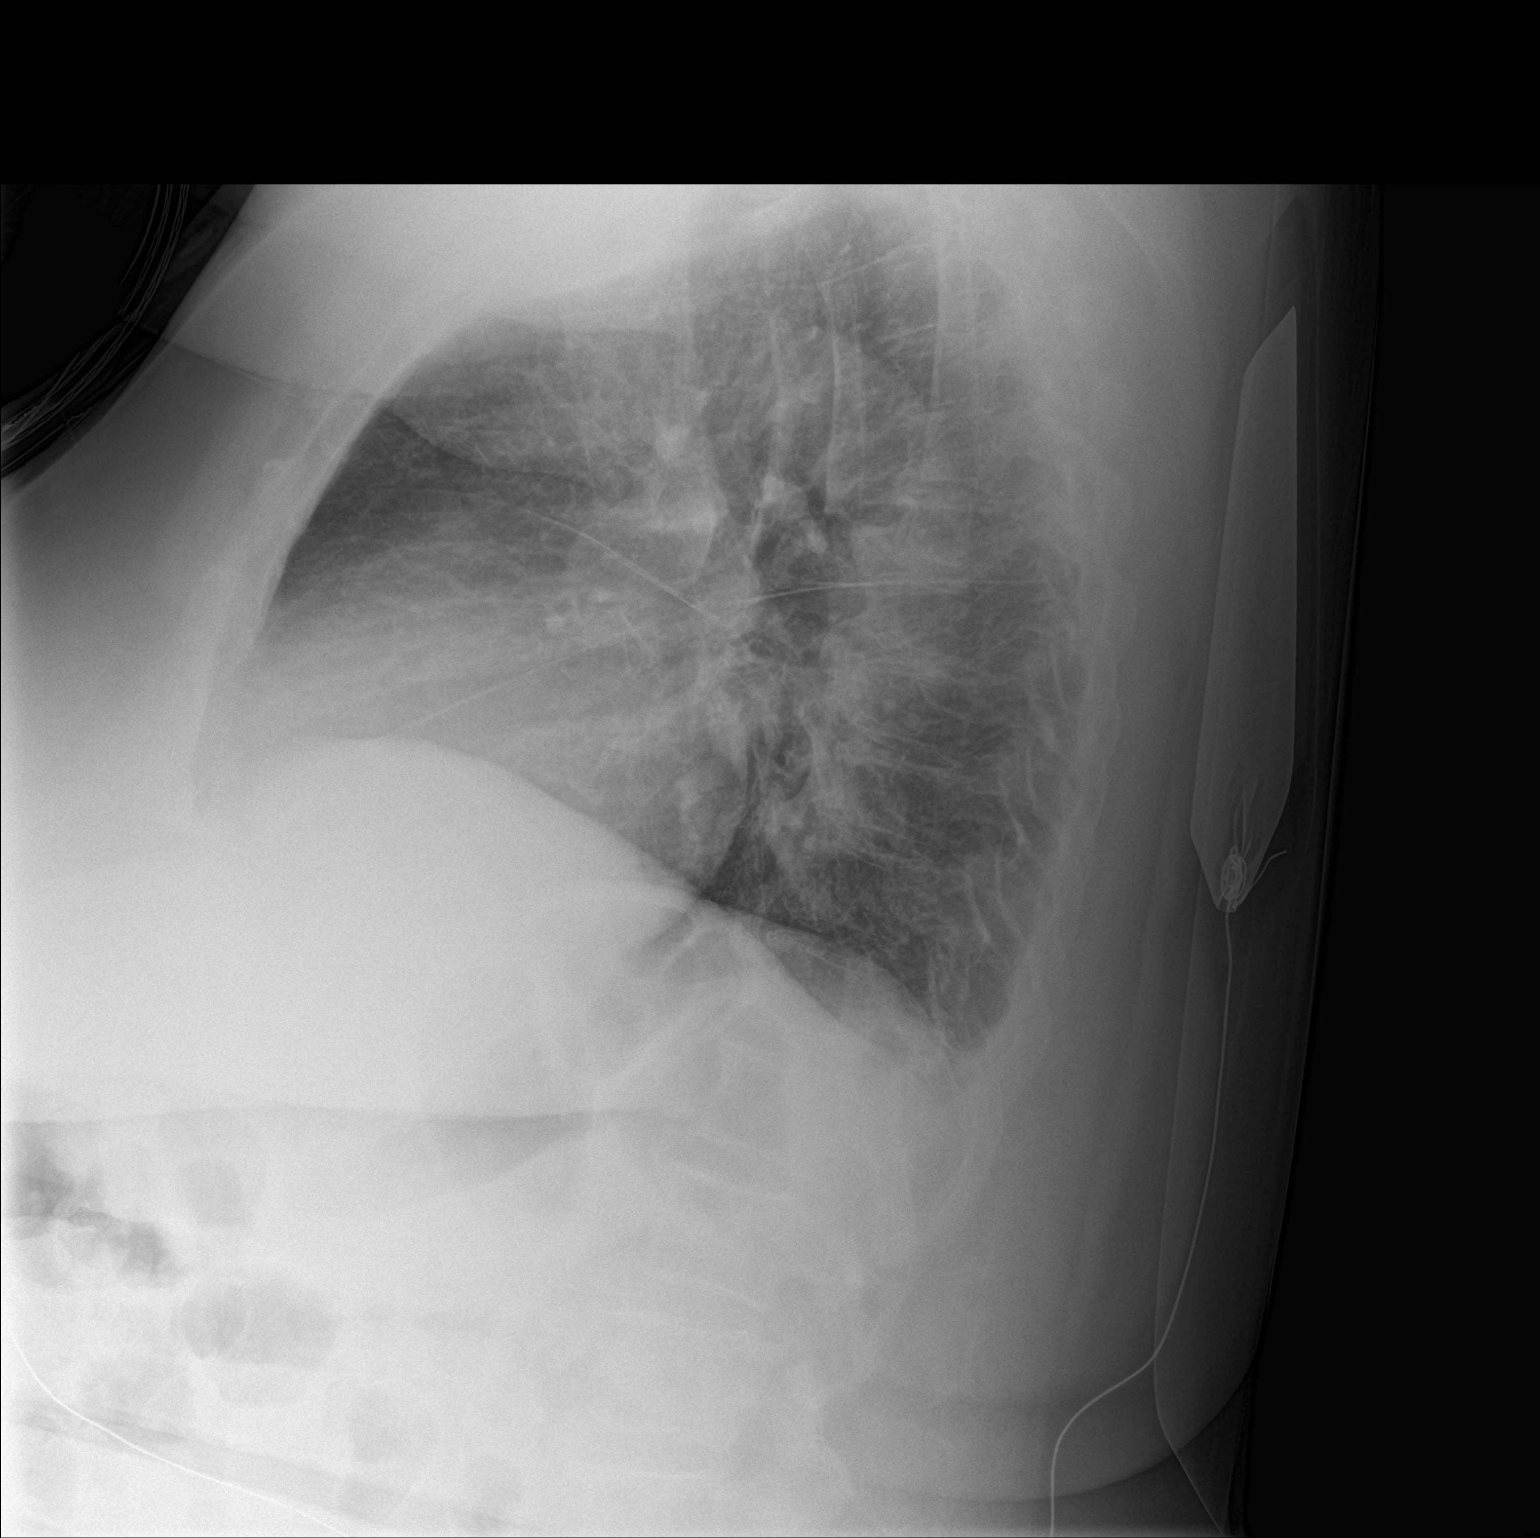
[im 2/2]
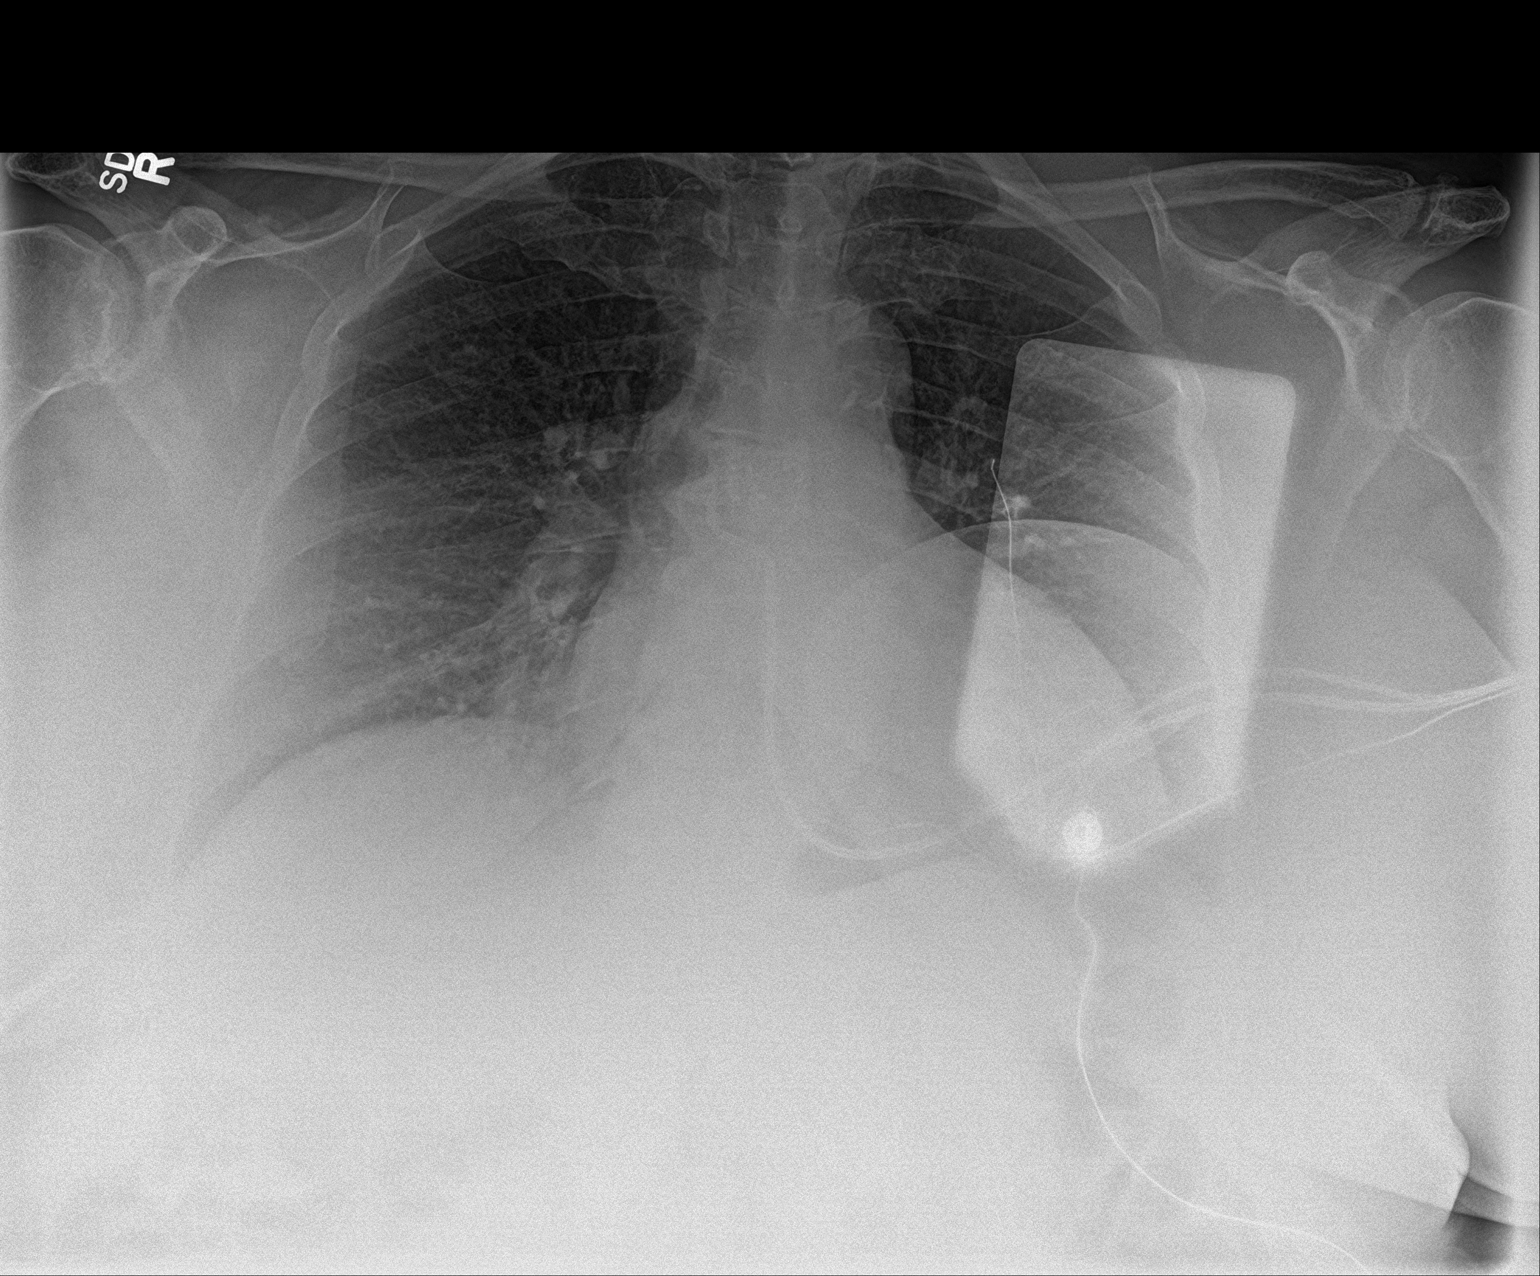

[2 of 2 positions shown; findings below may reference images not displayed]

FINDINGS: External pacemakers overlie the chest. Borderline cardiomegaly.
Aortic atherosclerosis. The lungs are clear. Question early
interstitial edema/fluid in the fissures. No visible effusions. No
significant bone finding.
IMPRESSION: Borderline/early CHF. Early interstitial edema and fluid in the
fissures.

## 2019-03-07 IMAGING — US US SOFT TISSUE HEAD/NECK
1 series · 14 of 25 positions shown · non-contrast
Comparison: None.

CLINICAL DATA: Right neck swelling.

EXAM:
ULTRASOUND OF HEAD/NECK SOFT TISSUES
TECHNIQUE: Ultrasound examination of the head and neck soft tissues was
performed in the area of clinical concern.

[Series 1: us soft tissue head/neck · 14 of 32 slices shown]
[im 1/32]
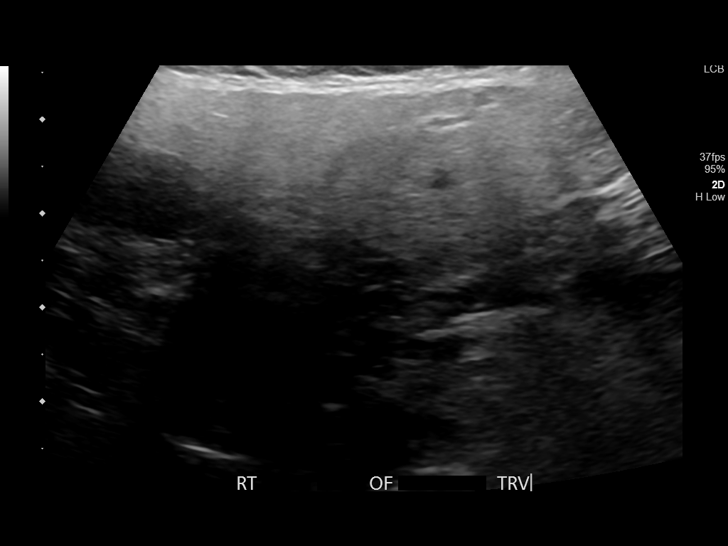
[im 3/32]
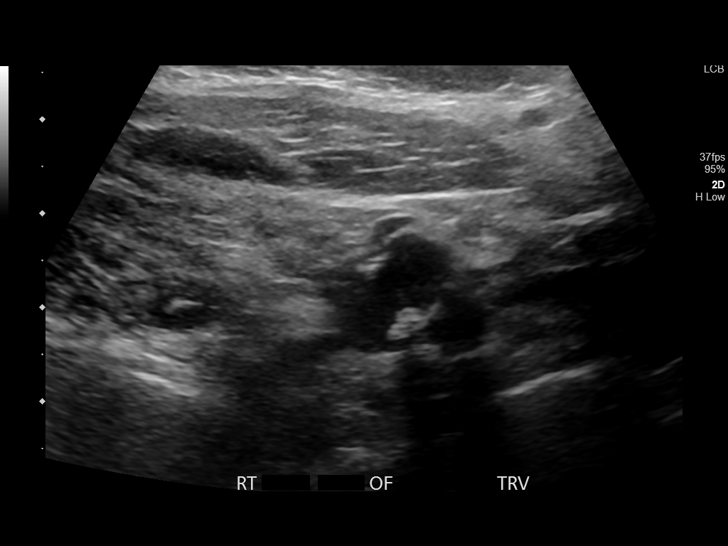
[im 6/32]
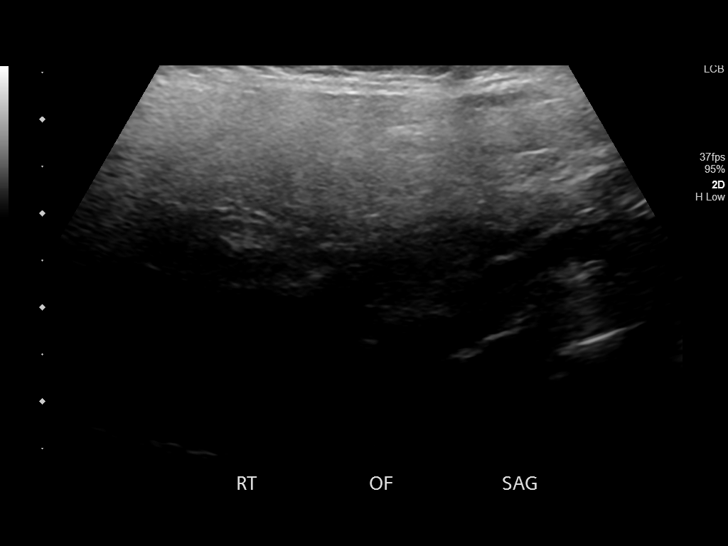
[im 8/32]
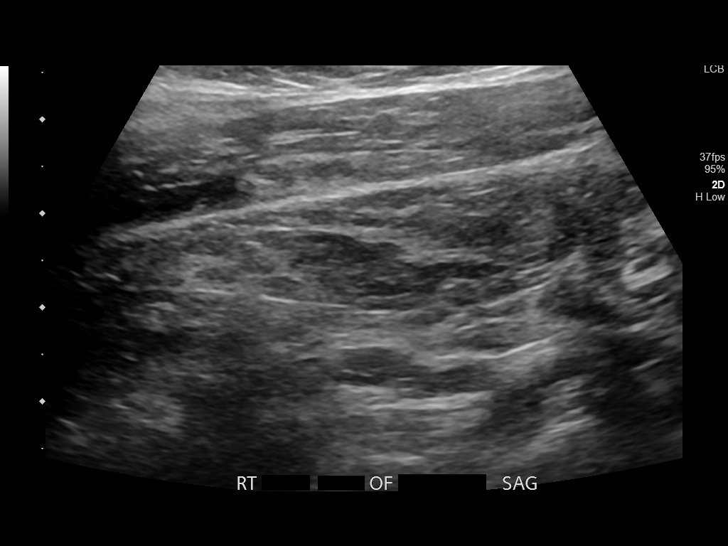
[im 11/32]
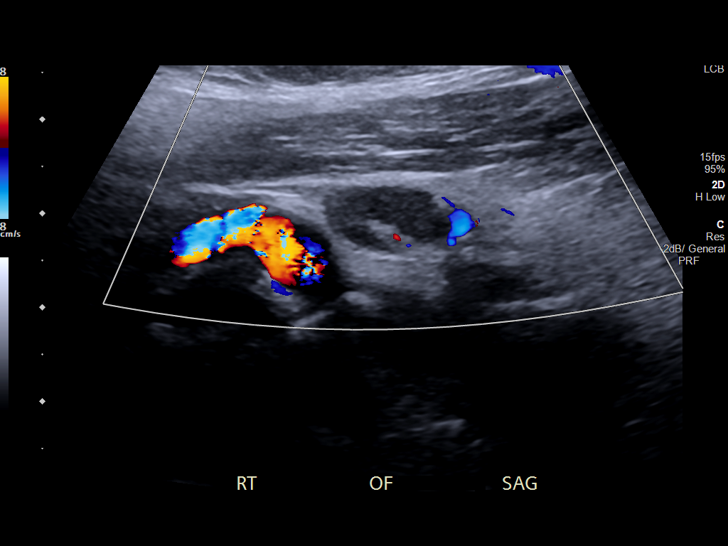
[im 12/32]
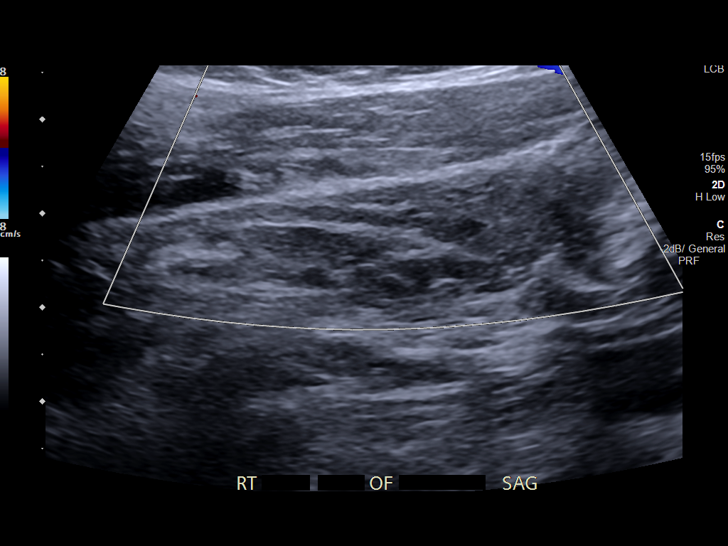
[im 15/32]
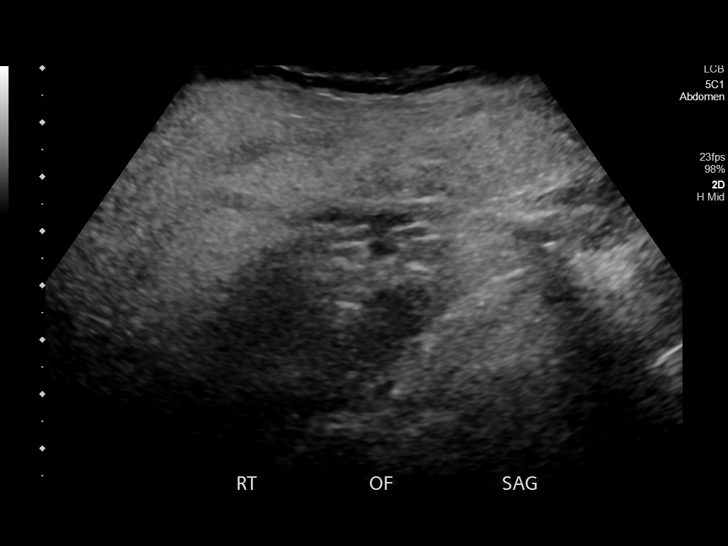
[im 17/32]
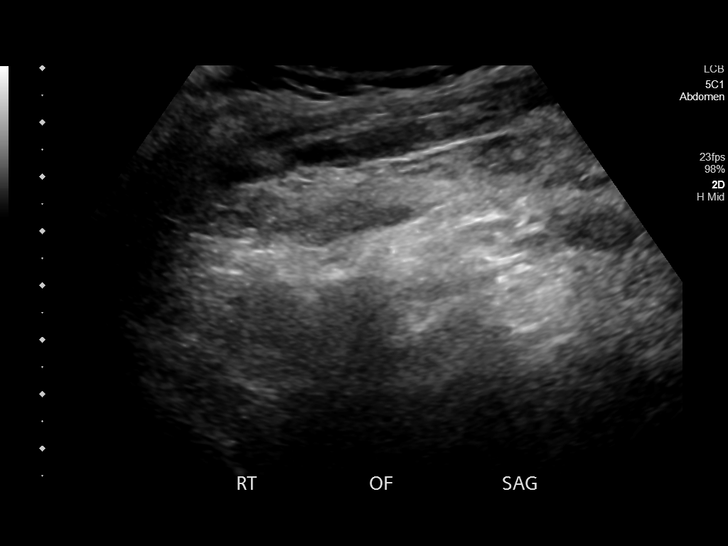
[im 20/32]
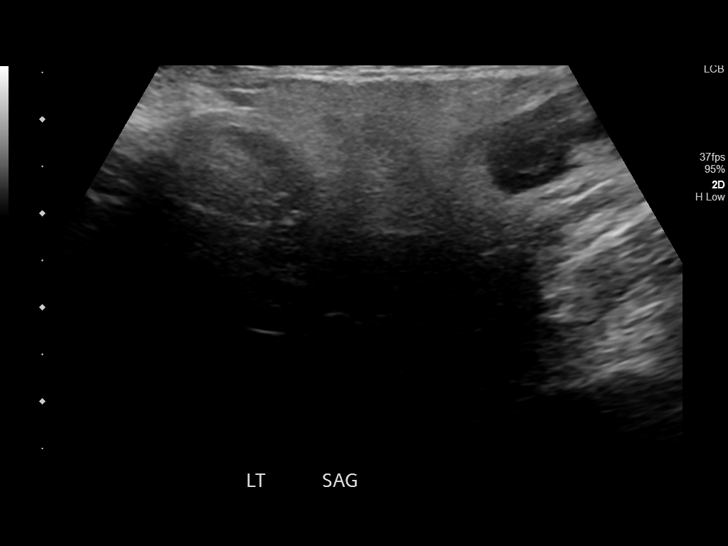
[im 21/32]
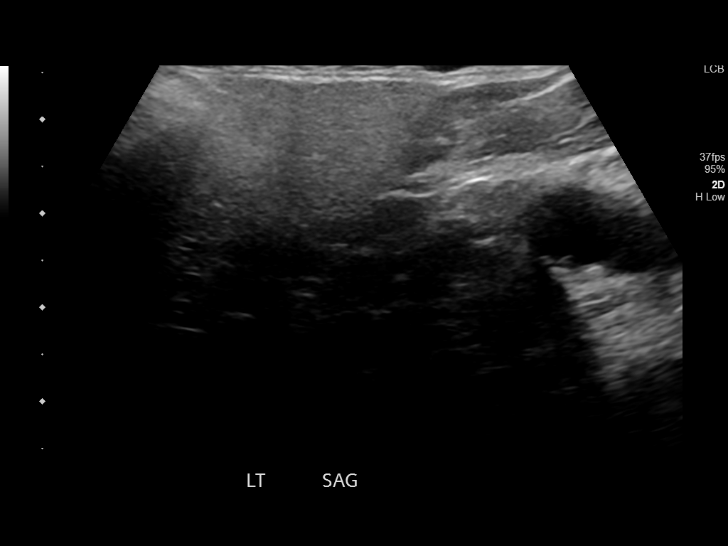
[im 24/32]
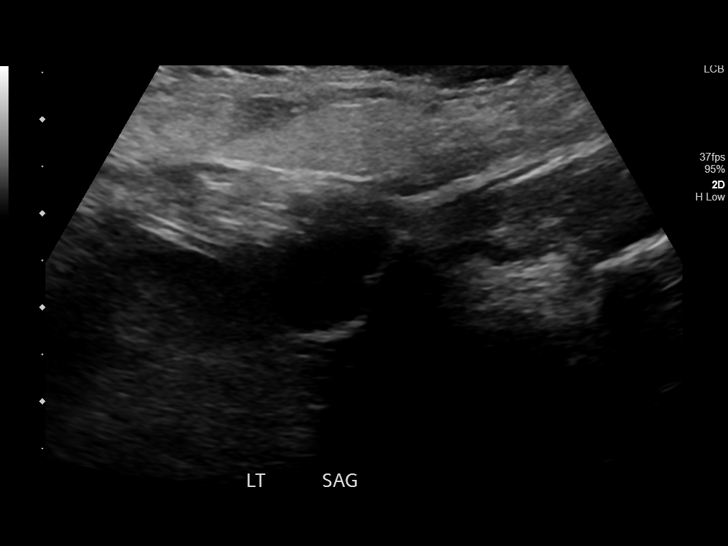
[im 26/32]
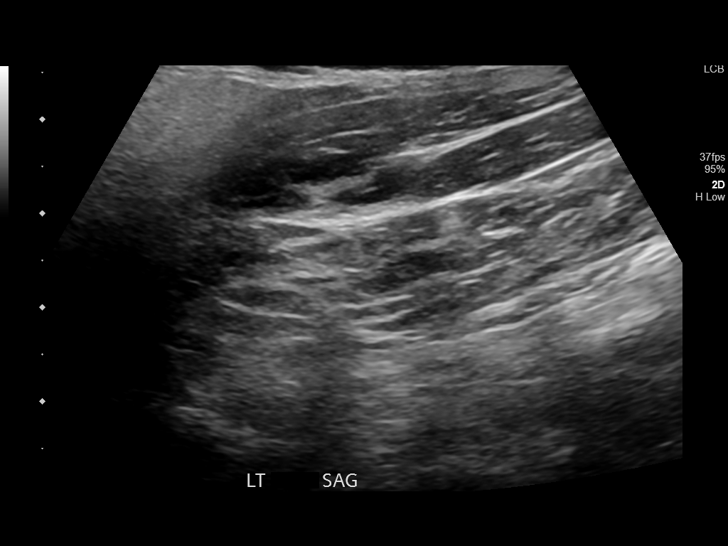
[im 29/32]
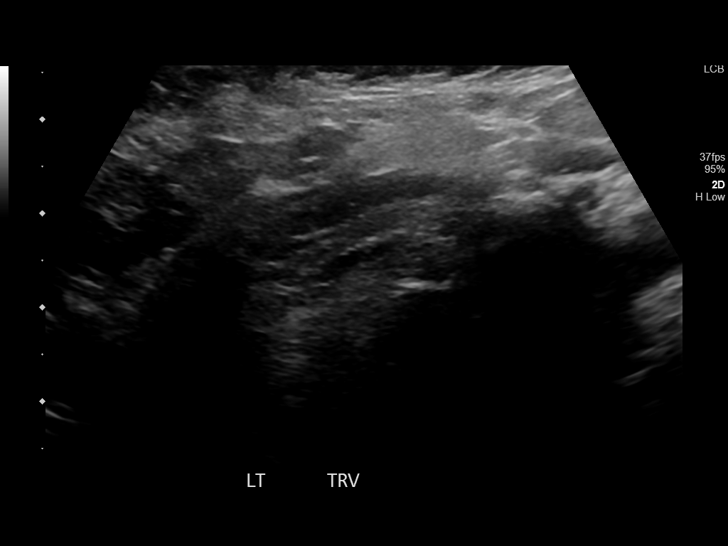
[im 32/32]
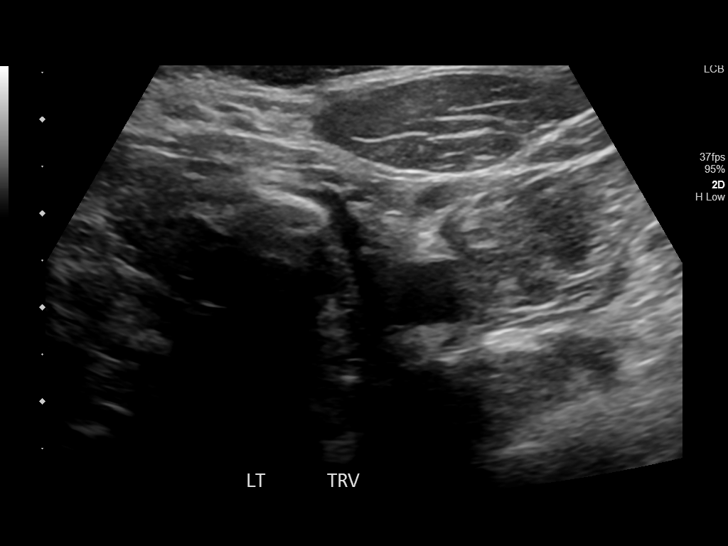

[14 of 25 positions shown; findings below may reference images not displayed]

FINDINGS: Right submandibular glands appears to be within normal limits. There
is no significant lymph node enlargement and no suspicious solid or
cystic lesions at the area of concern. Small normal appearing lymph
node at the area of concern measures 0.7 cm in the short axis with a
normal fatty hilum. Images of the left neck are unremarkable. Normal
appearance of the left submandibular gland. Incidentally, there may
be calcified plaque involving the carotid arteries.
IMPRESSION: No focal abnormality at the area of concern. No enlarged lymph
nodes.

Incidentally, there may be carotid artery calcifications.

## 2019-12-07 ENCOUNTER — Emergency Department: Payer: Medicare Other

## 2019-12-07 ENCOUNTER — Emergency Department
Admission: EM | Admit: 2019-12-07 | Discharge: 2019-12-07 | Disposition: A | Discharge status: Home / Self Care | Payer: Medicare Other | Attending: Emergency Medicine | Admitting: Emergency Medicine

## 2019-12-07 ENCOUNTER — Other Ambulatory Visit: Payer: Self-pay

## 2019-12-07 DIAGNOSIS — Z87891 Personal history of nicotine dependence: Secondary | ICD-10-CM | POA: Diagnosis not present

## 2019-12-07 DIAGNOSIS — Z7984 Long term (current) use of oral hypoglycemic drugs: Secondary | ICD-10-CM | POA: Diagnosis not present

## 2019-12-07 DIAGNOSIS — I959 Hypotension, unspecified: Secondary | ICD-10-CM | POA: Diagnosis present

## 2019-12-07 DIAGNOSIS — I509 Heart failure, unspecified: Secondary | ICD-10-CM | POA: Diagnosis not present

## 2019-12-07 DIAGNOSIS — I130 Hypertensive heart and chronic kidney disease with heart failure and stage 1 through stage 4 chronic kidney disease, or unspecified chronic kidney disease: Secondary | ICD-10-CM | POA: Diagnosis not present

## 2019-12-07 DIAGNOSIS — R55 Syncope and collapse: Secondary | ICD-10-CM | POA: Insufficient documentation

## 2019-12-07 DIAGNOSIS — I952 Hypotension due to drugs: Secondary | ICD-10-CM | POA: Diagnosis not present

## 2019-12-07 DIAGNOSIS — N183 Chronic kidney disease, stage 3 unspecified: Secondary | ICD-10-CM | POA: Diagnosis not present

## 2019-12-07 DIAGNOSIS — Z79899 Other long term (current) drug therapy: Secondary | ICD-10-CM | POA: Diagnosis not present

## 2019-12-07 DIAGNOSIS — Z794 Long term (current) use of insulin: Secondary | ICD-10-CM | POA: Insufficient documentation

## 2019-12-07 DIAGNOSIS — Z7982 Long term (current) use of aspirin: Secondary | ICD-10-CM | POA: Insufficient documentation

## 2019-12-07 DIAGNOSIS — E1122 Type 2 diabetes mellitus with diabetic chronic kidney disease: Secondary | ICD-10-CM | POA: Insufficient documentation

## 2019-12-07 LAB — COMPREHENSIVE METABOLIC PANEL
ALT: 16 U/L (ref 0–44)
AST: 22 U/L (ref 15–41)
Albumin: 3.6 g/dL (ref 3.5–5.0)
Alkaline Phosphatase: 58 U/L (ref 38–126)
Anion gap: 12 (ref 5–15)
BUN: 22 mg/dL (ref 8–23)
CO2: 26 mmol/L (ref 22–32)
Calcium: 9 mg/dL (ref 8.9–10.3)
Chloride: 94 mmol/L — ABNORMAL LOW (ref 98–111)
Creatinine, Ser: 2.06 mg/dL — ABNORMAL HIGH (ref 0.44–1.00)
GFR calc non Af Amer: 24 mL/min — ABNORMAL LOW (ref >60)
Glucose, Bld: 119 mg/dL — ABNORMAL HIGH (ref 70–99)
Potassium: 2.7 mmol/L — CL (ref 3.5–5.1)
Sodium: 132 mmol/L — ABNORMAL LOW (ref 135–145)
Total Bilirubin: 0.7 mg/dL (ref 0.3–1.2)
Total Protein: 6.9 g/dL (ref 6.5–8.1)

## 2019-12-07 LAB — TROPONIN I (HIGH SENSITIVITY): Troponin I (High Sensitivity): 10 ng/L (ref <18)

## 2019-12-07 LAB — CBC
HCT: 34.9 % — ABNORMAL LOW (ref 36.0–46.0)
Hemoglobin: 11.4 g/dL — ABNORMAL LOW (ref 12.0–15.0)
MCH: 28.4 pg (ref 26.0–34.0)
MCHC: 32.7 g/dL (ref 30.0–36.0)
MCV: 87 fL (ref 80.0–100.0)
Platelets: 255 10*3/uL (ref 150–400)
RBC: 4.01 MIL/uL (ref 3.87–5.11)
RDW: 13.7 % (ref 11.5–15.5)
WBC: 10.4 10*3/uL (ref 4.0–10.5)
nRBC: 0 % (ref 0.0–0.2)

## 2019-12-07 LAB — BRAIN NATRIURETIC PEPTIDE: B Natriuretic Peptide: 6.7 pg/mL (ref 0.0–100.0)

## 2019-12-07 MED ORDER — SODIUM CHLORIDE 0.9 % IV BOLUS
1000.0000 mL | Freq: Once | INTRAVENOUS | Status: AC
  Administered 2019-12-07: 1000 mL via INTRAVENOUS

## 2019-12-07 NOTE — ED Provider Notes (Signed)
[facility] Emergency Department Provider Note   ____________________________________________   First MD Initiated Contact with Patient 12/07/19 1411     (approximate)  I have reviewed the triage vital signs and the nursing notes.   HISTORY  Chief Complaint Hypotension and Dizziness    HPI Kathryn Kramer is a 69 y.o. female with a stated past medical history of CHF, CKD, type 2 diabetes, and previous CVA who presents for hypotension and presyncope that began this morning and has been stable since onset.  Patient states that she is on multiple medications for her blood pressure and fears that she may be on too many.  Patient states that she had a blood pressure medication that was just added recently that is likely caused a significant worsening in the symptoms.  Patient states that she had multiple episodes today when she was attempting to get up from sitting down and felt so lightheaded that she had to sit back down to keep from passing out.  Patient denies any associated chest pain, shortness of breath, weakness/numbness/syncope in any extremity, vision changes, or tinnitus         Past Medical History:  Diagnosis Date  . Acid reflux   . Anxiety   . Arrhythmia   . BP (high blood pressure) 01/25/2013  . CCF (congestive cardiac failure) ([place]) 08/04/2013  . CHF (congestive heart failure) ([place])   . Chronic kidney disease   . Constipation   . Cystocele, grade 2   . Diabetes mellitus type I ([place])   . Diabetes mellitus without complication ([place])   . Dyspnea   . Female incontinence   . Hypertension   . Recurrent UTI   . Stroke [facility]) 2001    Patient Active Problem List   Diagnosis Date Noted  . Bradycardia 03/19/2017  . Angioedema 07/26/2016  . Urinary incontinence, mixed 07/06/2015  . Morbid (severe) obesity due to excess calories ([place]) 01/19/2015  . Morbid obesity ([place]) 12/07/2014  . Recurrent UTI 10/29/2014  . Extreme obesity 08/09/2014  .  Cystocele, grade 2 08/09/2014  . Atrophic vaginitis 08/09/2014  . Incontinence 08/09/2014  . Compulsive tobacco user syndrome 05/09/2014  . Current tobacco use 05/09/2014  . Hx of adenomatous colonic polyps 02/21/2014  . CCF (congestive cardiac failure) ([place]) 08/04/2013  . Chronic kidney disease (CKD), stage III (moderate) ([facility]) 01/25/2013  . Diabetes ([place]) 01/25/2013  . BP (high blood pressure) 01/25/2013  . Angiopathy, peripheral ([place]) 01/25/2013  . Peripheral arterial occlusive disease ([place]) 01/25/2013  . Encounter for screening colonoscopy 12/23/2012  . Rectal bleeding 12/23/2012    Past Surgical History:  Procedure Laterality Date  . COLONOSCOPY  02/16/13  . COLONOSCOPY WITH PROPOFOL N/A 09/14/2018   Procedure: COLONOSCOPY WITH PROPOFOL;  Surgeon: Lin Landsman, MD;  Location: [facility] ENDOSCOPY;  Service: Gastroenterology;  Laterality: N/A;  . COLONOSCOPY WITH PROPOFOL N/A 09/15/2018   Procedure: COLONOSCOPY WITH PROPOFOL;  Surgeon: Lin Landsman, MD;  Location: [facility] ENDOSCOPY;  Service: Gastroenterology;  Laterality: N/A;  . elbow     pinch nerve  . HAND SURGERY Bilateral [id number]   carpel tunnel  . HIP SURGERY Right 1999  . MULTIPLE TOOTH EXTRACTIONS  2014  . TUBAL LIGATION  1985    Prior to Admission medications   Medication Sig Start Date End Date Taking? Authorizing Provider  aspirin 81 MG tablet Take 81 mg by mouth daily.    [provider]  atorvastatin (LIPITOR) 20 MG tablet Take 1 tablet by mouth  at bedtime.  12/18/12   [provider]  baclofen (LIORESAL) 10 MG tablet Take 1 tablet by mouth 3 (three) times daily. 12/18/12   [provider]  Calcium Carbonate-Vitamin D (CALCIUM + D PO) Take 2 tablets by mouth daily.    [provider]  cephALEXin (KEFLEX) 250 MG capsule Take 1 capsule by mouth daily. 03/07/17   [provider]  citalopram (CELEXA) 40 MG tablet Take 1 tablet (40 mg total) by mouth daily. 07/12/15    Rainey Pines, MD  clonazePAM (KLONOPIN) 0.25 MG disintegrating tablet Take 0.25 mg by mouth 2 (two) times daily as needed for anxiety.    [provider]  cloNIDine (CATAPRES) 0.2 MG tablet Take 1 tablet by mouth 2 (two) times daily. 06/20/16   [provider]  furosemide (LASIX) 20 MG tablet Take 1 tablet by mouth 2 (two) times daily.  12/17/12   [provider]  gabapentin (NEURONTIN) 100 MG capsule Take 2 capsules by mouth 2 (two) times a week. 01/13/17   [provider]  glipiZIDE (GLUCOTROL) 5 MG tablet Take 5 mg by mouth 2 (two) times daily before a meal.  12/18/12   [provider]  hydrALAZINE (APRESOLINE) 25 MG tablet Take 1 tablet (25 mg total) by mouth 3 (three) times daily. 07/27/16 07/27/17  Hillary Bow, MD  insulin glargine (LANTUS) 100 UNIT/ML injection Inject 20 Units into the skin daily.    [provider]  mirabegron ER (MYRBETRIQ) 25 MG TB24 tablet Take 1 tablet (25 mg total) by mouth daily. 07/03/15   McGowan, Larene Beach A, PA-C  PROAIR HFA 108 (90 BASE) MCG/ACT inhaler Inhale 2 puffs into the lungs every 8 (eight) hours. 10/22/12   [provider]  solifenacin (VESICARE) 10 MG tablet Take 1 tablet (10 mg total) by mouth daily. 08/09/14   Zara Council A, PA-C  spironolactone (ALDACTONE) 25 MG tablet Take 12.5 mg by mouth daily.  06/20/16   [provider]  amLODipine (NORVASC) 10 MG tablet Take 1 tablet by mouth daily. 12/17/12 12/07/19  [provider]    Allergies Ace inhibitors and Pravastatin  Family History  Problem Relation Age of Onset  . Colon cancer Father   . Colon polyps Brother   . Colon cancer Brother   . Diabetes Mother   . Alcohol abuse Mother   . Heart disease Sister   . Breast cancer Sister   . Alcohol abuse Brother   . Drug abuse Brother   . Alcohol abuse Brother   . Drug abuse Brother   . Alcohol abuse Brother   . Drug abuse Brother   . Alcohol abuse Brother   . Drug  abuse Brother   . Alcohol abuse Brother   . Drug abuse Brother   . Alcohol abuse Brother   . Drug abuse Brother   . Colon polyps Daughter   . Diabetes Daughter   . Kidney disease Neg Hx   . Bladder Cancer Neg Hx   . Ovarian cancer Neg Hx     Social History Social History   Tobacco Use  . Smoking status: Former Smoker    Packs/day: 0.50    Years: 20.00    Pack years: 10.00    Types: Cigarettes    Quit date: 09/15/2015    Years since quitting: 4.2  . Smokeless tobacco: Current User    Types: Snuff  Vaping Use  . Vaping Use: Never used  Substance Use Topics  . Alcohol use: No  Alcohol/week: 0.0 standard drinks  . Drug use: No    Review of Systems Constitutional: No fever/chills Eyes: No visual changes. ENT: No sore throat. Cardiovascular: Denies chest pain. Respiratory: Denies shortness of breath. Gastrointestinal: No abdominal pain.  No nausea, no vomiting.  No diarrhea. Genitourinary: Negative for dysuria. Musculoskeletal: Negative for acute arthralgias Skin: Negative for rash. Neurological: Negative for headaches, weakness/numbness/paresthesias in any extremity Psychiatric: Negative for suicidal ideation/homicidal ideation   ____________________________________________   PHYSICAL EXAM:  VITAL SIGNS: ED Triage Vitals  Enc Vitals Group     BP 12/07/19 1337 99/67     Pulse Rate 12/07/19 1337 78     Resp 12/07/19 1337 15     Temp 12/07/19 1337 98 F (36.7 C)     Temp Source 12/07/19 1337 Oral     SpO2 12/07/19 1337 98 %     Weight 12/07/19 1338 227 lb 1.2 oz (103 kg)     Height 12/07/19 1338 5\' 1"  (1.549 m)     Head Circumference --      Peak Flow --      Pain Score 12/07/19 1337 0     Pain Loc --      Pain Edu? --      Excl. in [place]? --    Constitutional: Alert and oriented. Well appearing and in no acute distress. Eyes: Conjunctivae are normal. PERRL. Head: Atraumatic. Nose: No congestion/rhinnorhea. Mouth/Throat: Mucous membranes are  moist. Neck: No stridor Cardiovascular: Grossly normal heart sounds.  Good peripheral circulation. Respiratory: Normal respiratory effort.  No retractions. Gastrointestinal: Soft and nontender. No distention. Musculoskeletal: No obvious deformities Neurologic:  Normal speech and language. No gross focal neurologic deficits are appreciated. Skin:  Skin is warm and dry. No rash noted. Psychiatric: Mood and affect are normal. Speech and behavior are normal.  ____________________________________________   LABS (all labs ordered are listed, but only abnormal results are displayed)  Labs Reviewed  CBC - Abnormal; Notable for the following components:      Result Value   Hemoglobin 11.4 (*)    HCT 34.9 (*)    All other components within normal limits  COMPREHENSIVE METABOLIC PANEL - Abnormal; Notable for the following components:   Sodium 132 (*)    Potassium 2.7 (*)    Chloride 94 (*)    Glucose, Bld 119 (*)    Creatinine, Ser 2.06 (*)    GFR calc non Af Amer 24 (*)    All other components within normal limits  BRAIN NATRIURETIC PEPTIDE  CBG MONITORING, ED  TROPONIN I (HIGH SENSITIVITY)   ____________________________________________  EKG  ED ECG REPORT I, Naaman Plummer, the attending physician, personally viewed and interpreted this ECG.  Date: 12/07/2019 EKG Time: 1400 Rate: 83 Rhythm: normal sinus rhythm QRS Axis: normal Intervals: normal ST/T Wave abnormalities: normal Narrative Interpretation: no evidence of acute ischemia  ____________________________________________  RADIOLOGY  ED MD interpretation: Portable 1 view x-ray of the chest shows cardiomegaly without significant pulmonary edema.  No evidence of pneumonia, pneumothorax, or widened mediastinum  Official radiology report(s): DG Chest Portable 1 View  Result Date: 12/07/2019 CLINICAL DATA:  Shortness of breath. EXAM: PORTABLE CHEST 1 VIEW COMPARISON:  03/19/2017 FINDINGS: Mildly degraded exam due to AP  portable technique and patient body habitus. Midline trachea. Moderate cardiomegaly. Atherosclerosis in the transverse aorta. No pleural effusion or pneumothorax. Numerous leads and wires project over the chest. No lobar consolidation. No convincing evidence of congestive failure. IMPRESSION: Cardiomegaly without congestive failure. Mild limitations as  detailed above. Aortic Atherosclerosis (ICD10-I70.0). Electronically Signed   By: Abigail Miyamoto M.D.   On: 12/07/2019 14:33    ____________________________________________   PROCEDURES  Procedure(s) performed (including Critical Care):  Procedures   ____________________________________________   INITIAL IMPRESSION / ASSESSMENT AND PLAN / ED COURSE  As part of my medical decision making, I reviewed the following data within the [place] notes reviewed and incorporated, Labs reviewed, EKG interpreted, Old chart reviewed, Radiograph reviewed and Notes from prior ED visits reviewed and incorporated        Patient is a 69 year old female with multiple past comorbidities that presents for persistent hypotension in the setting of polypharmacy for antihypertensives.  Differential diagnosis includes ACS, cardiogenic syncope, neurogenic syncope, dehydration, orthostatic syncope.  Patient's laboratory evaluation was significant for potassium of 2.7 without any evidence of EKG changes as possible cause for her syncope.  It is likely that the syncope is caused by the multiple medications that she is on.  I suggested to the patient that she would likely need to decrease or discontinue some of her medications.  Patient was encouraged to follow-up with her primary care physician as well as her cardiologist for further management and her antihypertensives.  The patient has been reexamined and is ready to be discharged.  All diagnostic results have been reviewed and discussed with the patient/family.  Care plan has been outlined and the  patient/family understands all current diagnoses, results, and treatment plans.  There are no new complaints, changes, or physical findings at this time.  All questions have been addressed and answered.  All medications, if any, that were given while in the emergency department or any that are being prescribed have been reviewed with the patient/family.  All side effects and adverse reactions have been explained.  Patient was instructed to, and agrees to follow-up with their primary care physician as well as return to the emergency department if any new or worsening symptoms develop.      ____________________________________________   FINAL CLINICAL IMPRESSION(S) / ED DIAGNOSES  Final diagnoses:  Hypotension due to drugs  Near syncope     ED Discharge Orders    None       Note:  This document was prepared using Dragon voice recognition software and may include unintentional dictation errors.   Naaman Plummer, MD 12/07/19 [phone]

## 2019-12-07 NOTE — ED Triage Notes (Addendum)
Pt presents to the [facility] via EMS from home with c/o intermittent episodes of hypotension and dizziness over the past week. EMS states that pt had BP in the 80's upon arrival to residence. Pt states that her blood pressure was low this morning but still took her hypertension medications.

## 2019-12-07 NOTE — Discharge Instructions (Addendum)
Please discontinue taking your amlodipine

## 2020-05-08 ENCOUNTER — Ambulatory Visit (INDEPENDENT_AMBULATORY_CARE_PROVIDER_SITE_OTHER): Payer: Medicare Other | Admitting: Internal Medicine

## 2020-05-08 VITALS — BP 125/79 | HR 83 | Temp 97.9°F | Resp 16 | Ht 61.0 in | Wt 219.0 lb

## 2020-05-08 DIAGNOSIS — G4733 Obstructive sleep apnea (adult) (pediatric): Secondary | ICD-10-CM

## 2020-05-08 DIAGNOSIS — Z9989 Dependence on other enabling machines and devices: Secondary | ICD-10-CM

## 2020-05-08 DIAGNOSIS — Z7189 Other specified counseling: Secondary | ICD-10-CM | POA: Diagnosis not present

## 2020-05-08 DIAGNOSIS — I509 Heart failure, unspecified: Secondary | ICD-10-CM | POA: Diagnosis not present

## 2020-05-08 DIAGNOSIS — Z6841 Body Mass Index (BMI) 40.0 and over, adult: Secondary | ICD-10-CM

## 2020-05-08 DIAGNOSIS — I10 Essential (primary) hypertension: Secondary | ICD-10-CM

## 2020-05-08 NOTE — Patient Instructions (Signed)

## 2020-05-08 NOTE — Progress Notes (Addendum)
[facility] [place], [place] [address]  Pulmonary Sleep Medicine   Office Visit Note  Patient Name: Kathryn Kramer DOB: Apr 14, 1950 MRN [id number]    Chief Complaint: Obstructive Sleep Apnea visit  Brief History:  Kathryn Kramer is seen today for follow up The patient has a 6 year history of sleep apnea. Patient is using PAP nightly. Her BIPAP is past end of life and also has been on recall therefore not able to use. Her BiPAP is on the recall but she has not yet registered it. The patient feels more rested after sleeping with PAP.  The patient reports benefiting from PAP use. Reported sleepiness is much improved and the Epworth Sleepiness Score is 2 out of 24. The patient does take naps. The patient complains of the following: no complaints  The compliance download shows excellent compliance with an average use time of 6.5 hours. The AHI is 2.3  The patient does not complain of limb movements disrupting sleep.  ROS  General: (-) fever, (-) chills, (-) night sweat Nose and Sinuses: (-) nasal stuffiness or itchiness, (-) postnasal drip, (-) nosebleeds, (-) sinus trouble. Mouth and Throat: (-) sore throat, (-) hoarseness. Neck: (-) swollen glands, (-) enlarged thyroid, (-) neck pain. Respiratory: - cough, - shortness of breath, - wheezing. Neurologic: - numbness, - tingling. Psychiatric: - anxiety, - depression   Current Medication: Outpatient Encounter Medications as of 05/08/2020  Medication Sig  . conjugated estrogens (PREMARIN) vaginal cream   . furosemide (LASIX) 20 MG tablet Take by mouth.  . hydrALAZINE (APRESOLINE) 100 MG tablet   . hydrochlorothiazide (HYDRODIURIL) 25 MG tablet   . liraglutide (VICTOZA) 18 MG/3ML SOPN   . losartan (COZAAR) 25 MG tablet   . metoprolol tartrate (LOPRESSOR) 50 MG tablet Take by mouth.  Marland Kitchen amLODipine (NORVASC) 10 MG tablet Take by mouth.  Marland Kitchen aspirin 81 MG EC tablet Take by mouth.  Marland Kitchen aspirin 81 MG tablet Take 81 mg by mouth  daily.  Marland Kitchen atorvastatin (LIPITOR) 20 MG tablet Take 1 tablet by mouth at bedtime.   Marland Kitchen atorvastatin (LIPITOR) 20 MG tablet Take by mouth.  . baclofen (LIORESAL) 10 MG tablet Take 1 tablet by mouth 3 (three) times daily.  . baclofen (LIORESAL) 10 MG tablet Take by mouth.  . Calcium Carbonate-Vitamin D (CALCIUM + D PO) Take 2 tablets by mouth daily.  . carvedilol (COREG) 25 MG tablet Take by mouth.  . cephALEXin (KEFLEX) 250 MG capsule Take 1 capsule by mouth daily.  . citalopram (CELEXA) 40 MG tablet Take 1 tablet (40 mg total) by mouth daily.  . citalopram (CELEXA) 40 MG tablet Take by mouth.  . clonazePAM (KLONOPIN) 0.25 MG disintegrating tablet Take 0.25 mg by mouth 2 (two) times daily as needed for anxiety.  . cloNIDine (CATAPRES) 0.2 MG tablet Take 1 tablet by mouth 2 (two) times daily.  . fluticasone (FLONASE) 50 MCG/ACT nasal spray Place into both nostrils.  . Fluticasone-Salmeterol (ADVAIR) 250-50 MCG/DOSE AEPB Inhale into the lungs.  . furosemide (LASIX) 20 MG tablet Take 1 tablet by mouth 2 (two) times daily.   Marland Kitchen gabapentin (NEURONTIN) 100 MG capsule Take 2 capsules by mouth 2 (two) times a week.  Marland Kitchen glipiZIDE (GLUCOTROL) 5 MG tablet Take 5 mg by mouth 2 (two) times daily before a meal.   . hydrALAZINE (APRESOLINE) 25 MG tablet Take 1 tablet (25 mg total) by mouth 3 (three) times daily.  . insulin glargine (LANTUS) 100 UNIT/ML injection Inject 20 Units  into the skin daily.  . mirabegron ER (MYRBETRIQ) 25 MG TB24 tablet Take 1 tablet (25 mg total) by mouth daily.  . Potassium Chloride ER 20 MEQ TBCR Take 1 tablet by mouth once a day  1 tablet daily  . PROAIR HFA 108 (90 BASE) MCG/ACT inhaler Inhale 2 puffs into the lungs every 8 (eight) hours.  Marland Kitchen QUEtiapine (SEROQUEL) 100 MG tablet Take by mouth.  . solifenacin (VESICARE) 10 MG tablet Take 1 tablet (10 mg total) by mouth daily.  Marland Kitchen spironolactone (ALDACTONE) 25 MG tablet Take 12.5 mg by mouth daily.   . [DISCONTINUED] amLODipine  (NORVASC) 10 MG tablet Take 1 tablet by mouth daily.   No facility-administered encounter medications on file as of 05/08/2020.    Surgical History: Past Surgical History:  Procedure Laterality Date  . COLONOSCOPY  02/16/13  . COLONOSCOPY WITH PROPOFOL N/A 09/14/2018   Procedure: COLONOSCOPY WITH PROPOFOL;  Surgeon: Lin Landsman, MD;  Location: [facility] ENDOSCOPY;  Service: Gastroenterology;  Laterality: N/A;  . COLONOSCOPY WITH PROPOFOL N/A 09/15/2018   Procedure: COLONOSCOPY WITH PROPOFOL;  Surgeon: Lin Landsman, MD;  Location: [facility] ENDOSCOPY;  Service: Gastroenterology;  Laterality: N/A;  . elbow     pinch nerve  . HAND SURGERY Bilateral [id number]   carpel tunnel  . HIP SURGERY Right 1999  . MULTIPLE TOOTH EXTRACTIONS  2014  . TUBAL LIGATION  1985    Medical History: Past Medical History:  Diagnosis Date  . Acid reflux   . Anxiety   . Arrhythmia   . BP (high blood pressure) 01/25/2013  . CCF (congestive cardiac failure) ([place]) 08/04/2013  . CHF (congestive heart failure) ([place])   . Chronic kidney disease   . Constipation   . Cystocele, grade 2   . Diabetes mellitus type I ([place])   . Diabetes mellitus without complication ([place])   . Dyspnea   . Female incontinence   . Hypertension   . Recurrent UTI   . Stroke [facility]) 2001    Family History: Non contributory to the present illness  Social History: Social History   Socioeconomic History  . Marital status: Married    Spouse name: Not on file  . Number of children: Not on file  . Years of education: Not on file  . Highest education level: Not on file  Occupational History  . Occupation: Retired  Tobacco Use  . Smoking status: Former Smoker    Packs/day: 0.50    Years: 20.00    Pack years: 10.00    Types: Cigarettes    Quit date: 09/15/2015    Years since quitting: 4.6  . Smokeless tobacco: Current User    Types: Snuff  Vaping Use  . Vaping Use: Never used  Substance and Sexual Activity  . Alcohol use:  No    Alcohol/week: 0.0 standard drinks  . Drug use: No  . Sexual activity: Never    Birth control/protection: None  Other Topics Concern  . Not on file  Social History Narrative  . Not on file   Social Determinants of Health   Financial Resource Strain: Not on file  Food Insecurity: Not on file  Transportation Needs: Not on file  Physical Activity: Not on file  Stress: Not on file  Social Connections: Not on file  Intimate Partner Violence: Not on file    Vital Signs: Blood pressure 125/79, pulse 83, temperature 97.9 F (36.6 C), temperature source Temporal, resp. rate 16, height 5\' 1"  (1.549 m), weight 219 lb (  99.3 kg), SpO2 100 %.  Examination: General Appearance: The patient is well-developed, well-nourished, and in no distress. Neck Circumference: 38 Skin: Gross inspection of skin unremarkable. Head: normocephalic, no gross deformities. Eyes: no gross deformities noted. ENT: ears appear grossly normal Neurologic: Alert and oriented. No involuntary movements.    EPWORTH SLEEPINESS SCALE:  Scale:  (0)= no chance of dozing; (1)= slight chance of dozing; (2)= moderate chance of dozing; (3)= high chance of dozing  Chance  Situtation    Sitting and reading: 1    Watching TV: 0    Sitting Inactive in public: 0    As a passenger in car: 0      Lying down to rest: 1    Sitting and talking: 0    Sitting quielty after lunch: 0    In a car, stopped in traffic: 0   TOTAL SCORE:   2 out of 24    SLEEP STUDIES:  1. PSG 07/23/18 - AHI 96.7,  Low SpO2 82%   CPAP COMPLIANCE DATA:  Date Range: 05/09/19 - 05/07/20  Average Daily Use: 6:22 hours  Median Use: 6:29  Compliance for > 4 Hours: 88.5%  AHI: 2.3 respiratory events per hour  Days Used: 359/365  Mask Leak: 1:17/night  95th Percentile Pressure: epap- 10.3cm, PS- 6.3        LABS: No results found for this or any previous visit (from the past 2160 hour(s)).  Radiology: DG Chest  Portable 1 View  Result Date: 12/07/2019 CLINICAL DATA:  Shortness of breath. EXAM: PORTABLE CHEST 1 VIEW COMPARISON:  03/19/2017 FINDINGS: Mildly degraded exam due to AP portable technique and patient body habitus. Midline trachea. Moderate cardiomegaly. Atherosclerosis in the transverse aorta. No pleural effusion or pneumothorax. Numerous leads and wires project over the chest. No lobar consolidation. No convincing evidence of congestive failure. IMPRESSION: Cardiomegaly without congestive failure. Mild limitations as detailed above. Aortic Atherosclerosis (ICD10-I70.0). Electronically Signed   By: Abigail Miyamoto M.D.   On: 12/07/2019 14:33    No results found.  No results found.    Assessment and Plan: Patient Active Problem List   Diagnosis Date Noted  . Benign essential HTN 04/04/2017  . Hyperlipidemia, mixed 04/04/2017  . Bradycardia 03/19/2017  . Headache disorder 08/19/2016  . Angioedema 07/26/2016  . Urinary incontinence, mixed 07/06/2015  . Morbid (severe) obesity due to excess calories ([place]) 01/19/2015  . Morbid obesity ([place]) 12/07/2014  . Recurrent UTI 10/29/2014  . Extreme obesity 08/09/2014  . Cystocele, grade 2 08/09/2014  . Atrophic vaginitis 08/09/2014  . Incontinence 08/09/2014  . Compulsive tobacco user syndrome 05/09/2014  . Current tobacco use 05/09/2014  . Hx of adenomatous colonic polyps 02/21/2014  . CCF (congestive cardiac failure) ([place]) 08/04/2013  . CHF (congestive heart failure) ([place]) 08/04/2013  . Chronic kidney disease (CKD), stage III (moderate) (H[facility]) 01/25/2013  . Diabetes ([place]) 01/25/2013  . BP (high blood pressure) 01/25/2013  . Angiopathy, peripheral ([place]) 01/25/2013  . Peripheral arterial occlusive disease ([place]) 01/25/2013  . Encounter for screening colonoscopy 12/23/2012  . Rectal bleeding 12/23/2012   1. OSA on CPAP The patient does tolerate PAP and reports significant benefit from PAP use. The patient is caring for her machine as  recommended and advised to continue using the BiPAP despite the recall due to her severe sleep apnea.  Mask leak is elevated but does not disturb her.The patient was also counselled on the importance of weight loss in treating the sleep apnea..  The compliance has been  excellent and the apnea is controlled. OSA- continue excellent compliance  Addendum: As noted above her BIPAP is past the end of life for the machine. In addition her machine is on the recall list and therefore needs a replacement device   2. CPAP use counseling CPAP Counseling: had a lengthy discussion with the patient regarding the importance of PAP therapy in management of the sleep apnea. Patient appears to understand the risk factor reduction and also understands the risks associated with untreated sleep apnea. Patient will try to make a good faith effort to remain compliant with therapy. Also instructed the patient on proper cleaning of the device including the water must be changed daily if possible and use of distilled water is preferred. Patient understands that the machine should be regularly cleaned with appropriate recommended cleaning solutions that do not damage the PAP machine for example given white vinegar and water rinses. Other methods such as ozone treatment may not be as good as these simple methods to achieve cleaning.  3. Morbid obesity ([place]). She has been losing weight consistently, commended on success.  Obesity Counseling: Had a lengthy discussion regarding patients BMI and weight issues. Patient was instructed on portion control as well as increased activity. Also discussed caloric restrictions with trying to maintain intake less than 2000 Kcal. Discussions were made in accordance with the 5As of weight management. Simple actions such as not eating late and if able to, taking a walk is suggested.  4. Congestive heart failure, unspecified HF chronicity, unspecified heart failure type ([place]) Well compensated.  Continue with present medications.   5. Hypertension, unspecified type Well controlled, continue with present medications. Hypertension Counseling:   The following hypertensive lifestyle modification were recommended and discussed:  1. Limiting alcohol intake to less than 1 oz/day of ethanol:(24 oz of beer or 8 oz of wine or 2 oz of 100-proof whiskey). 2. Take baby ASA 81 mg daily. 3. Importance of regular aerobic exercise and losing weight. 4. Reduce dietary saturated fat and cholesterol intake for overall cardiovascular health. 5. Maintaining adequate dietary potassium, calcium, and magnesium intake. 6. Regular monitoring of the blood pressure. 7. Reduce sodium intake to less than 100 mmol/day (less than 2.3 gm of sodium or less than 6 gm of sodium choride)    The patient does tolerate PAP and reports significant benefit from PAP use. The patient is caring for her machine as recommended and advised to continue using the BiPAP despite the recall due to her severe sleep apnea.  Mask leak is elevated but does not disturb her.The patient was also counselled on the importance of weight loss in treating the sleep apnea..  The compliance has been excellent and the apnea is controlled  2. OSA- continue excellent compliance  General Counseling: I have discussed the findings of the evaluation and examination with [facility].  I have also discussed any further diagnostic evaluation thatmay be needed or ordered today. Kathryn Kramer verbalizes understanding of the findings of todays visit. We also reviewed her medications today and discussed drug interactions and side effects including but not limited excessive drowsiness and altered mental states. We also discussed that there is always a risk not just to her but also people around her. she has been encouraged to call the office with any questions or concerns that should arise related to todays visit.  No orders of the defined types were placed in this encounter.        I have personally obtained a history, examined the patient, evaluated  laboratory and imaging results, formulated the assessment and plan and placed orders.   This patient was seen today by Tressie Ellis, PA-C in collaboration with Dr. Devona Konig.  Kathryn Ito Saunders Glance, PhD, FAASM  Diplomate, American Board of Sleep Medicine    Allyne Gee, MD [facility] Diplomate ABMS Pulmonary and Critical Care Medicine Sleep medicine

## 2021-05-07 ENCOUNTER — Ambulatory Visit (INDEPENDENT_AMBULATORY_CARE_PROVIDER_SITE_OTHER): Payer: Medicare Other | Admitting: Internal Medicine

## 2021-05-07 VITALS — BP 138/87 | HR 83 | Resp 16 | Ht 61.0 in | Wt 230.0 lb

## 2021-05-07 DIAGNOSIS — Z7189 Other specified counseling: Secondary | ICD-10-CM | POA: Insufficient documentation

## 2021-05-07 DIAGNOSIS — I509 Heart failure, unspecified: Secondary | ICD-10-CM | POA: Diagnosis not present

## 2021-05-07 DIAGNOSIS — G4733 Obstructive sleep apnea (adult) (pediatric): Secondary | ICD-10-CM | POA: Insufficient documentation

## 2021-05-07 NOTE — Progress Notes (Signed)
[facility] [place], [place] [address]  Pulmonary Sleep Medicine   Office Visit Note  Patient Name: Kathryn Kramer DOB: [birth year] MRN [id number]    Chief Complaint: Obstructive Sleep Apnea visit  Brief History:  Curley is seen today for follow up visit.  The patient has a 6 year history of sleep apnea. Patient is using PAP nightly.  The patient feels feel good after sleeping with PAP.  The patient reports benefiting from PAP use. Reported sleepiness is  resolved and the Epworth Sleepiness Score is 3 out of 24. The patient does take naps 30 min naps 2 - 3 times week and wears BIPAP sometimes. The patient complains of the following: no problems  The compliance download shows  compliance with an average use time of 8:32 hours '@98'$ .6%. The AHI is 1.6  The patient does not complain of limb movements disrupting sleep.  ROS  General: (-) fever, (-) chills, (-) night sweat Nose and Sinuses: (-) nasal stuffiness or itchiness, (-) postnasal drip, (-) nosebleeds, (-) sinus trouble. Mouth and Throat: (-) sore throat, (-) hoarseness. Neck: (-) swollen glands, (-) enlarged thyroid, (-) neck pain. Respiratory: - cough, - shortness of breath, - wheezing. Neurologic: - numbness, - tingling. Psychiatric: - anxiety, - depression   Current Medication: Outpatient Encounter Medications as of 05/07/2021  Medication Sig   amLODipine (NORVASC) 10 MG tablet Take by mouth.   aspirin 81 MG EC tablet Take by mouth.   aspirin 81 MG tablet Take 81 mg by mouth daily.   atorvastatin (LIPITOR) 20 MG tablet Take 1 tablet by mouth at bedtime.    atorvastatin (LIPITOR) 20 MG tablet Take by mouth.   baclofen (LIORESAL) 10 MG tablet Take 1 tablet by mouth 3 (three) times daily.   baclofen (LIORESAL) 10 MG tablet Take by mouth.   Calcium Carbonate-Vitamin D (CALCIUM + D PO) Take 2 tablets by mouth daily.   carvedilol (COREG) 25 MG tablet Take by mouth.   citalopram (CELEXA) 40 MG tablet Take 1  tablet (40 mg total) by mouth daily.   citalopram (CELEXA) 40 MG tablet Take by mouth.   clonazePAM (KLONOPIN) 0.25 MG disintegrating tablet Take 0.25 mg by mouth 2 (two) times daily as needed for anxiety.   cloNIDine (CATAPRES) 0.2 MG tablet Take 1 tablet by mouth 2 (two) times daily.   conjugated estrogens (PREMARIN) vaginal cream    fluticasone (FLONASE) 50 MCG/ACT nasal spray Place into both nostrils.   Fluticasone-Salmeterol (ADVAIR) 250-50 MCG/DOSE AEPB Inhale into the lungs.   furosemide (LASIX) 20 MG tablet Take 1 tablet by mouth 2 (two) times daily.    furosemide (LASIX) 20 MG tablet Take by mouth.   gabapentin (NEURONTIN) 100 MG capsule Take 2 capsules by mouth 2 (two) times a week.   glipiZIDE (GLUCOTROL) 5 MG tablet Take 5 mg by mouth 2 (two) times daily before a meal.    hydrALAZINE (APRESOLINE) 100 MG tablet    hydrALAZINE (APRESOLINE) 25 MG tablet Take 1 tablet (25 mg total) by mouth 3 (three) times daily.   hydrochlorothiazide (HYDRODIURIL) 25 MG tablet    insulin glargine (LANTUS) 100 UNIT/ML injection Inject 20 Units into the skin daily.   liraglutide (VICTOZA) 18 MG/3ML SOPN    losartan (COZAAR) 25 MG tablet    metoprolol tartrate (LOPRESSOR) 50 MG tablet Take by mouth.   mirabegron ER (MYRBETRIQ) 25 MG TB24 tablet Take 1 tablet (25 mg total) by mouth daily.   Potassium Chloride ER 20  MEQ TBCR Take 1 tablet by mouth once a day  1 tablet daily   PROAIR HFA 108 (90 BASE) MCG/ACT inhaler Inhale 2 puffs into the lungs every 8 (eight) hours.   QUEtiapine (SEROQUEL) 100 MG tablet Take by mouth.   solifenacin (VESICARE) 10 MG tablet Take 1 tablet (10 mg total) by mouth daily.   spironolactone (ALDACTONE) 25 MG tablet Take 12.5 mg by mouth daily.    [DISCONTINUED] cephALEXin (KEFLEX) 250 MG capsule Take 1 capsule by mouth daily.   No facility-administered encounter medications on file as of 05/07/2021.    Surgical History: Past Surgical History:  Procedure Laterality Date    COLONOSCOPY  02/16/13   COLONOSCOPY WITH PROPOFOL N/A 09/14/2018   Procedure: COLONOSCOPY WITH PROPOFOL;  Surgeon: Lin Landsman, MD;  Location: [facility] ENDOSCOPY;  Service: Gastroenterology;  Laterality: N/A;   COLONOSCOPY WITH PROPOFOL N/A 09/15/2018   Procedure: COLONOSCOPY WITH PROPOFOL;  Surgeon: Lin Landsman, MD;  Location: [facility] ENDOSCOPY;  Service: Gastroenterology;  Laterality: N/A;   elbow     pinch nerve   HAND SURGERY Bilateral [id number]   carpel tunnel   HIP SURGERY Right 1999   MULTIPLE TOOTH EXTRACTIONS  2014   TUBAL LIGATION  1985    Medical History: Past Medical History:  Diagnosis Date   Acid reflux    Anxiety    Arrhythmia    BP (high blood pressure) 01/25/2013   CCF (congestive cardiac failure) ([place]) 08/04/2013   CHF (congestive heart failure) (HCC)    Chronic kidney disease    Constipation    Cystocele, grade 2    Diabetes mellitus type I ([place])    Diabetes mellitus without complication ([place])    Dyspnea    Female incontinence    Hypertension    Recurrent UTI    Stroke ([place]) 2001    Family History: Non contributory to the present illness  Social History: Social History   Socioeconomic History   Marital status: Married    Spouse name: Not on file   Number of children: Not on file   Years of education: Not on file   Highest education level: Not on file  Occupational History   Occupation: Retired  Tobacco Use   Smoking status: Former    Packs/day: 0.50    Years: 20.00    Pack years: 10.00    Types: Cigarettes    Quit date: 09/15/2015    Years since quitting: 5.6   Smokeless tobacco: Current    Types: Snuff  Vaping Use   Vaping Use: Never used  Substance and Sexual Activity   Alcohol use: No    Alcohol/week: 0.0 standard drinks   Drug use: No   Sexual activity: Never    Birth control/protection: None  Other Topics Concern   Not on file  Social History Narrative   Not on file   Social Determinants of Health   Financial  Resource Strain: Not on file  Food Insecurity: Not on file  Transportation Needs: Not on file  Physical Activity: Not on file  Stress: Not on file  Social Connections: Not on file  Intimate Partner Violence: Not on file    Vital Signs: Blood pressure 138/87, pulse 83, resp. rate 16, height '5\' 1"'$  (1.549 m), weight 230 lb (104.3 kg), SpO2 99 %. Body mass index is 43.46 kg/m.    Examination: General Appearance: The patient is well-developed, well-nourished, and in no distress. Neck Circumference: 38cm Skin: Gross inspection of skin unremarkable. Head: normocephalic, no  gross deformities. Eyes: no gross deformities noted. ENT: ears appear grossly normal Neurologic: Alert and oriented. No involuntary movements.    EPWORTH SLEEPINESS SCALE:  Scale:  (0)= no chance of dozing; (1)= slight chance of dozing; (2)= moderate chance of dozing; (3)= high chance of dozing  Chance  Situtation    Sitting and reading: 1    Watching TV: 1    Sitting Inactive in public: 0    As a passenger in car: 0      Lying down to rest: 1    Sitting and talking: 0    Sitting quielty after lunch: 0    In a car, stopped in traffic: 0   TOTAL SCORE:   3 out of 24    SLEEP STUDIES:  PSG 07/23/18 - AHI 96.7,  Low SpO2 82%   CPAP COMPLIANCE DATA:  Date Range: 05/08/20 - 05/06/21  Average Daily Use: 8:32 hours  Median Use: 5:36  Compliance for > 4 Hours: 98.6%  AHI: 1.6 respiratory events per hour  Days Used: 361/365  Mask Leak: avg time in high leak 15 min, 7 seconds  95th Percentile Pressure: BiPAP ASV auto BiPAP ASV @ min/max epap 8/15cmH2O,  min/max PS 4/15cmH2O,  max press 25cmH2O    LABS: No results found for this or any previous visit (from the past 2160 hour(s)).  Radiology: DG Chest Portable 1 View  Result Date: 12/07/2019 CLINICAL DATA:  Shortness of breath. EXAM: PORTABLE CHEST 1 VIEW COMPARISON:  03/19/2017 FINDINGS: Mildly degraded exam due to AP portable  technique and patient body habitus. Midline trachea. Moderate cardiomegaly. Atherosclerosis in the transverse aorta. No pleural effusion or pneumothorax. Numerous leads and wires project over the chest. No lobar consolidation. No convincing evidence of congestive failure. IMPRESSION: Cardiomegaly without congestive failure. Mild limitations as detailed above. Aortic Atherosclerosis (ICD10-I70.0). Electronically Signed   By: Abigail Miyamoto M.D.   On: 12/07/2019 14:33    No results found.  No results found.    Assessment and Plan: Patient Active Problem List   Diagnosis Date Noted   OSA treated with BiPAP 05/07/2021   Encounter for BiPAP use counseling 05/07/2021   Benign essential HTN 04/04/2017   Hyperlipidemia, mixed 04/04/2017   Bradycardia 03/19/2017   Headache disorder 08/19/2016   Angioedema 07/26/2016   Urinary incontinence, mixed 07/06/2015   Morbid (severe) obesity due to excess calories ([place]) 01/19/2015   Morbid obesity ([place]) 12/07/2014   Recurrent UTI 10/29/2014   Extreme obesity 08/09/2014   Cystocele, grade 2 08/09/2014   Atrophic vaginitis 08/09/2014   Incontinence 08/09/2014   Compulsive tobacco user syndrome 05/09/2014   Current tobacco use 05/09/2014   Hx of adenomatous colonic polyps 02/21/2014   CCF (congestive cardiac failure) ([place]) 08/04/2013   CHF (congestive heart failure) ([place]) 08/04/2013   Chronic kidney disease (CKD), stage III (moderate) ([place]) 01/25/2013   Diabetes ([place]) 01/25/2013   BP (high blood pressure) 01/25/2013   Angiopathy, peripheral ([place]) 01/25/2013   Peripheral arterial occlusive disease ([place]) 01/25/2013   Encounter for screening colonoscopy 12/23/2012   Rectal bleeding 12/23/2012   1. OSA treated with BiPAP The patient does tolerate PAP and reports  benefit from PAP use. The patient was reminded how to clean equipment and advised to replace supplies routinely. The patient was also counselled on weight loss. She has a documented history of  CHF but we have no echocardiogram to review- pt states last one she had was about 5 years ago and she was told she was "  perfect". We discussed risks of ASV with pts with chf, she will ask her pcp to order an updated echo and we will follow up if her ejection fraction is below 45%. . The compliance is excellent. The AHI is 1.6.   OSA treated with bipap- obtain echo. F/u one year.    2. Encounter for BiPAP use counseling Counseling: had a lengthy discussion with the patient regarding the importance of PAP therapy in management of the sleep apnea. Patient appears to understand the risk factor reduction and also understands the risks associated with untreated sleep apnea. Patient will try to make a good faith effort to remain compliant with therapy. Also instructed the patient on proper cleaning of the device including the water must be changed daily if possible and use of distilled water is preferred. Patient understands that the machine should be regularly cleaned with appropriate recommended cleaning solutions that do not damage the PAP machine for example given white vinegar and water rinses. Other methods such as ozone treatment may not be as good as these simple methods to achieve cleaning.   3. Congestive heart failure, unspecified HF chronicity, unspecified heart failure type ([place]) Will obtain echo in light of ASV. Clinically compensated and stable.     General Counseling: I have discussed the findings of the evaluation and examination with [facility].  I have also discussed any further diagnostic evaluation thatmay be needed or ordered today. Cruz verbalizes understanding of the findings of todays visit. We also reviewed her medications today and discussed drug interactions and side effects including but not limited excessive drowsiness and altered mental states. We also discussed that there is always a risk not just to her but also people around her. she has been encouraged to call the office with any  questions or concerns that should arise related to todays visit.  No orders of the defined types were placed in this encounter.       I have personally obtained a history, examined the patient, evaluated laboratory and imaging results, formulated the assessment and plan and placed orders. This patient was seen today by Tressie Ellis, PA-C in collaboration with Dr. Devona Konig.   Allyne Gee, MD [facility] Diplomate ABMS Pulmonary Critical Care Medicine and Sleep Medicine

## 2021-05-07 NOTE — Patient Instructions (Signed)

## 2021-05-22 ENCOUNTER — Other Ambulatory Visit (HOSPITAL_COMMUNITY): Payer: Self-pay | Admitting: Family Medicine

## 2021-05-22 ENCOUNTER — Other Ambulatory Visit: Payer: Self-pay | Admitting: Family Medicine

## 2021-05-22 DIAGNOSIS — Z87891 Personal history of nicotine dependence: Secondary | ICD-10-CM

## 2021-06-11 ENCOUNTER — Ambulatory Visit
Admission: RE | Admit: 2021-06-11 | Discharge: 2021-06-11 | Disposition: A | Discharge status: Home / Self Care | Payer: Medicare Other | Source: Ambulatory Visit | Attending: Family Medicine | Admitting: Family Medicine

## 2021-06-11 DIAGNOSIS — Z136 Encounter for screening for cardiovascular disorders: Secondary | ICD-10-CM | POA: Diagnosis present

## 2021-06-11 DIAGNOSIS — Z87891 Personal history of nicotine dependence: Secondary | ICD-10-CM | POA: Diagnosis not present

## 2021-07-02 ENCOUNTER — Ambulatory Visit (INDEPENDENT_AMBULATORY_CARE_PROVIDER_SITE_OTHER): Payer: Medicare Other | Admitting: Cardiology

## 2021-07-02 ENCOUNTER — Encounter: Payer: Self-pay | Admitting: Cardiology

## 2021-07-02 VITALS — BP 106/68 | HR 76 | Ht 61.0 in | Wt 235.0 lb

## 2021-07-02 DIAGNOSIS — I10 Essential (primary) hypertension: Secondary | ICD-10-CM

## 2021-07-02 DIAGNOSIS — I5030 Unspecified diastolic (congestive) heart failure: Secondary | ICD-10-CM

## 2021-07-02 DIAGNOSIS — I493 Ventricular premature depolarization: Secondary | ICD-10-CM | POA: Diagnosis not present

## 2021-07-02 NOTE — Progress Notes (Signed)
?Cardiology Office Note:   ? ?Date:  07/02/2021  ? ?ID:  Kathryn Kramer, DOB 1951/03/02, MRN [id number] ? ?PCP:  Weyman Rodney, MD ?  ?[facility] Providers ?Cardiologist:  None    ? ?Referring MD: Weyman Rodney, MD  ? ?Chief Complaint  ?Patient presents with  ? Other  ?  Est. Care no complaints today. Meds reviewed verbally with pt.  ? ? ?History of Present Illness:   ? ?Kathryn Kramer is a 71 y.o. female with a hx of hypertension, hyperlipidemia, diabetes, HFpEF, CVA 2020, former smoker x8 years who presents to establish care. ? ?Previously seen at [facility] cardiology from a cardiac perspective.  Has a history of PVCs, takes Coreg and Lopressor.  States feeling well, denies chest pain or shortness of breath.  Has occasional edema well controlled with Lasix. ? ?Echocardiogram 08/2016 EF 55 to 60%, impaired relaxation. ? ?Past Medical History:  ?Diagnosis Date  ? Acid reflux   ? Anxiety   ? Arrhythmia   ? BP (high blood pressure) 01/25/2013  ? CCF (congestive cardiac failure) ([place]) 08/04/2013  ? CHF (congestive heart failure) ([place])   ? Chronic kidney disease   ? Constipation   ? Cystocele, grade 2   ? Diabetes mellitus type I ([place])   ? Diabetes mellitus without complication ([place])   ? Dyspnea   ? Female incontinence   ? Hypertension   ? Recurrent UTI   ? Stroke [facility]) 2001  ? ? ?Past Surgical History:  ?Procedure Laterality Date  ? COLONOSCOPY  02/16/13  ? COLONOSCOPY WITH PROPOFOL N/A 09/14/2018  ? Procedure: COLONOSCOPY WITH PROPOFOL;  Surgeon: Lin Landsman, MD;  Location: [facility] ENDOSCOPY;  Service: Gastroenterology;  Laterality: N/A;  ? COLONOSCOPY WITH PROPOFOL N/A 09/15/2018  ? Procedure: COLONOSCOPY WITH PROPOFOL;  Surgeon: Lin Landsman, MD;  Location: [facility] ENDOSCOPY;  Service: Gastroenterology;  Laterality: N/A;  ? elbow    ? pinch nerve  ? HAND SURGERY Bilateral [id number]  ? carpel tunnel  ? HIP SURGERY Right 1999  ? MULTIPLE TOOTH EXTRACTIONS  2014  ? TUBAL LIGATION  1985  ? ? ?Current Medications: ?Current  Meds  ?Medication Sig  ? amLODipine (NORVASC) 10 MG tablet Take by mouth.  ? aspirin 81 MG tablet Take 81 mg by mouth daily.  ? atorvastatin (LIPITOR) 20 MG tablet Take by mouth.  ? baclofen (LIORESAL) 10 MG tablet Take 1 tablet by mouth 3 (three) times daily.  ? Calcium Carbonate-Vitamin D (CALCIUM + D PO) Take 2 tablets by mouth daily.  ? carvedilol (COREG) 25 MG tablet Take by mouth.  ? citalopram (CELEXA) 40 MG tablet Take 1 tablet (40 mg total) by mouth daily.  ? clonazePAM (KLONOPIN) 0.25 MG disintegrating tablet Take 0.25 mg by mouth 2 (two) times daily as needed for anxiety.  ? cloNIDine (CATAPRES) 0.2 MG tablet Take 1 tablet by mouth 2 (two) times daily.  ? conjugated estrogens (PREMARIN) vaginal cream   ? fluticasone (FLONASE) 50 MCG/ACT nasal spray Place into both nostrils.  ? Fluticasone-Salmeterol (ADVAIR) 250-50 MCG/DOSE AEPB Inhale into the lungs.  ? furosemide (LASIX) 20 MG tablet Take 1 tablet by mouth 2 (two) times daily.  ? gabapentin (NEURONTIN) 100 MG capsule Take 2 capsules by mouth at bedtime.  ? glipiZIDE (GLUCOTROL) 5 MG tablet Take 5 mg by mouth 2 (two) times daily before a meal.   ? hydrALAZINE (APRESOLINE) 25 MG tablet Take 1 tablet (25 mg total) by mouth 3 (three) times daily.  ?  insulin glargine (LANTUS) 100 UNIT/ML injection Inject 20 Units into the skin daily.  ? liraglutide (VICTOZA) 18 MG/3ML SOPN   ? losartan (COZAAR) 25 MG tablet Take 25 mg by mouth daily.  ? mirabegron ER (MYRBETRIQ) 25 MG TB24 tablet Take 1 tablet (25 mg total) by mouth daily.  ? Potassium Chloride ER 20 MEQ TBCR Take 1 tablet by mouth once a day  1 tablet daily  ? PROAIR HFA 108 (90 BASE) MCG/ACT inhaler Inhale 2 puffs into the lungs every 8 (eight) hours.  ? QUEtiapine (SEROQUEL) 100 MG tablet Take 100 mg by mouth at bedtime.  ? solifenacin (VESICARE) 10 MG tablet Take 1 tablet (10 mg total) by mouth daily.  ? spironolactone (ALDACTONE) 25 MG tablet Take 12.5 mg by mouth daily.   ? [DISCONTINUED] metoprolol  tartrate (LOPRESSOR) 50 MG tablet Take 50 mg by mouth daily.  ?  ? ?Allergies:   Ace inhibitors and Pravastatin  ? ?Social History  ? ?Socioeconomic History  ? Marital status: Married  ?  Spouse name: Not on file  ? Number of children: Not on file  ? Years of education: Not on file  ? Highest education level: Not on file  ?Occupational History  ? Occupation: Retired  ?Tobacco Use  ? Smoking status: Former  ?  Packs/day: 0.50  ?  Years: 20.00  ?  Pack years: 10.00  ?  Types: Cigarettes  ?  Quit date: 09/15/2015  ?  Years since quitting: 5.8  ? Smokeless tobacco: Current  ?  Types: Snuff  ?Vaping Use  ? Vaping Use: Never used  ?Substance and Sexual Activity  ? Alcohol use: No  ?  Alcohol/week: 0.0 standard drinks  ? Drug use: No  ? Sexual activity: Never  ?  Birth control/protection: None  ?Other Topics Concern  ? Not on file  ?Social History Narrative  ? Not on file  ? ?Social Determinants of Health  ? ?Financial Resource Strain: Not on file  ?Food Insecurity: Not on file  ?Transportation Needs: Not on file  ?Physical Activity: Not on file  ?Stress: Not on file  ?Social Connections: Not on file  ?  ? ?Family History: ?The patient's family history includes Alcohol abuse in her brother, brother, brother, brother, brother, brother, and mother; Breast cancer in her sister; Colon cancer in her brother and father; Colon polyps in her brother and daughter; Diabetes in her daughter and mother; Drug abuse in her brother, brother, brother, brother, brother, and brother; Heart disease in her sister. There is no history of Kidney disease, Bladder Cancer, or Ovarian cancer. ? ?ROS:   ?Please see the history of present illness.    ? All other systems reviewed and are negative. ? ?EKGs/Labs/Other Studies Reviewed:   ? ?The following studies were reviewed today: ? ? ?EKG:  EKG is  ordered today.  The ekg ordered today demonstrates sinus rhythm, frequent PVCs. ? ?Recent Labs: ?No results found for requested labs within last 8760  hours.  ?Recent Lipid Panel ?No results found for: CHOL, TRIG, HDL, CHOLHDL, VLDL, LDLCALC, LDLDIRECT ? ? ?Risk Assessment/Calculations:   ? ?    ? ?Physical Exam:   ? ?VS:  BP 106/68 (BP Location: Right Arm, Patient Position: Sitting, Cuff Size: Large)   Pulse 76   Ht '5\' 1"'$  (1.549 m)   Wt 235 lb (106.6 kg)   SpO2 98%   BMI 44.40 kg/m?    ? ?Wt Readings from Last 3 Encounters:  ?07/02/21 235 lb (  106.6 kg)  ?05/07/21 230 lb (104.3 kg)  ?05/08/20 219 lb (99.3 kg)  ?  ? ?GEN:  Well nourished, well developed in no acute distress ?HEENT: Normal ?NECK: No JVD; No carotid bruits ?CARDIAC: RRR, no murmurs, rubs, gallops ?RESPIRATORY:  Clear to auscultation without rales, wheezing or rhonchi  ?ABDOMEN: Soft, non-tender, non-distended ?MUSCULOSKELETAL:  No edema; No deformity  ?SKIN: Warm and dry ?NEUROLOGIC:  Alert and oriented x 3 ?PSYCHIATRIC:  Normal affect  ? ?ASSESSMENT:   ? ?1. Frequent PVCs   ?2. Primary hypertension   ?3. Heart failure with preserved ejection fraction, unspecified HF chronicity ([place])   ?4. Morbid obesity ([place])   ? ?PLAN:   ? ?In order of problems listed above: ? ?Frequent PVCs, obtain echocardiogram.  Continue Coreg 25 mg twice daily. ?Hypertension, BP controlled.  Continue Coreg, losartan, Aldactone.  Stop Lopressor with taking coreg. ?HFpEF, echo as above.  Appears euvolemic.  Continue Lasix. ?Morbid obesity, weight loss, low-calorie diet advised. ? ?Follow-up after echocardiogram. ? ?   ? ?Medication Adjustments/Labs and Tests Ordered: ?Current medicines are reviewed at length with the patient today.  Concerns regarding medicines are outlined above.  ?Orders Placed This Encounter  ?Procedures  ? EKG 12-Lead  ? ECHOCARDIOGRAM COMPLETE  ? ?No orders of the defined types were placed in this encounter. ? ? ?Patient Instructions  ?Medication Instructions:  ? ?Your physician has recommended you make the following change in your medication:  ? ? STOP taking your Lopressor. ? ?*If you need a refill  on your cardiac medications before your next appointment, please call your pharmacy* ? ? ?Lab Work: ?None ordered ? ?If you have labs (blood work) drawn today and your tests are completely normal, you will receiv

## 2021-07-02 NOTE — Patient Instructions (Signed)
Medication Instructions:  ? ?Your physician has recommended you make the following change in your medication:  ? ? STOP taking your Lopressor. ? ?*If you need a refill on your cardiac medications before your next appointment, please call your pharmacy* ? ? ?Lab Work: ?None ordered ? ?If you have labs (blood work) drawn today and your tests are completely normal, you will receive your results only by: ?MyChart Message (if you have MyChart) OR ?A paper copy in the mail ?If you have any lab test that is abnormal or we need to change your treatment, we will call you to review the results. ? ? ?Testing/Procedures: ? ?Your physician has requested that you have an echocardiogram. Echocardiography is a painless test that uses sound waves to create images of your heart. It provides your doctor with information about the size and shape of your heart and how well your heart?s chambers and valves are working. This procedure takes approximately one hour. There are no restrictions for this procedure. ? ? ? ?Follow-Up: ?At [facility], you and your health needs are our priority.  As part of our continuing mission to provide you with exceptional heart care, we have created designated Provider Care Teams.  These Care Teams include your primary Cardiologist (physician) and Advanced Practice Providers (APPs -  Physician Assistants and Nurse Practitioners) who all work together to provide you with the care you need, when you need it. ? ?We recommend signing up for the patient portal called "MyChart".  Sign up information is provided on this After Visit Summary.  MyChart is used to connect with patients for Virtual Visits (Telemedicine).  Patients are able to view lab/test results, encounter notes, upcoming appointments, etc.  Non-urgent messages can be sent to your provider as well.   ?To learn more about what you can do with MyChart, go to [email or web address].   ? ?Your next appointment:   ?4-6 week(s) ? ?The format for your  next appointment:   ?In Person ? ?Provider:   ?You may see Kate Sable, MD or one of the following Advanced Practice Providers on your designated Care Team:   ?Murray Hodgkins, NP ?Christell Faith, PA-C ?Cadence Kathlen Mody, PA-C  ? ? ?Other Instructions ? ? ?Important Information About Sugar ? ? ? ? ? ? ?

## 2021-07-24 ENCOUNTER — Ambulatory Visit (INDEPENDENT_AMBULATORY_CARE_PROVIDER_SITE_OTHER): Payer: Medicare Other

## 2021-07-24 DIAGNOSIS — I493 Ventricular premature depolarization: Secondary | ICD-10-CM | POA: Diagnosis not present

## 2021-07-24 DIAGNOSIS — I5030 Unspecified diastolic (congestive) heart failure: Secondary | ICD-10-CM

## 2021-07-24 LAB — ECHOCARDIOGRAM COMPLETE
AR max vel: 1.9 cm2
AV Area VTI: 2.53 cm2
AV Area mean vel: 1.63 cm2
AV Mean grad: 3 mmHg
AV Peak grad: 4.8 mmHg
Ao pk vel: 1.09 m/s
Area-P 1/2: 5.79 cm2
Calc EF: 47.7 %
S' Lateral: 4 cm
Single Plane A2C EF: 44.6 %
Single Plane A4C EF: 50.9 %

## 2021-08-13 ENCOUNTER — Encounter: Payer: Self-pay | Admitting: Cardiology

## 2021-08-13 ENCOUNTER — Ambulatory Visit (INDEPENDENT_AMBULATORY_CARE_PROVIDER_SITE_OTHER): Payer: Medicare Other | Admitting: Cardiology

## 2021-08-13 VITALS — BP 120/72 | HR 83 | Ht 61.0 in | Wt 234.0 lb

## 2021-08-13 DIAGNOSIS — I5030 Unspecified diastolic (congestive) heart failure: Secondary | ICD-10-CM

## 2021-08-13 DIAGNOSIS — I493 Ventricular premature depolarization: Secondary | ICD-10-CM | POA: Diagnosis not present

## 2021-08-13 DIAGNOSIS — I10 Essential (primary) hypertension: Secondary | ICD-10-CM

## 2021-08-13 NOTE — Progress Notes (Signed)
Cardiology Office Note:    Date:  08/13/2021   ID:  Kathryn Kramer, DOB [birth year], MRN [id number]  PCP:  Kathryn Rodney, MD   [facility] Providers Cardiologist:  None     Referring MD: Kathryn Rodney, MD   Chief Complaint  Patient presents with   Follow-up    F/U after echo    History of Present Illness:    Kathryn Kramer is a 71 y.o. female with a hx of hypertension, hyperlipidemia, diabetes, HFpEF, CVA 2020, former smoker x8 years who presents for follow-up.   Seen previously due to HFpEF, PVCs.  Echocardiogram ordered to evaluate cardiac function.  Takes Lasix with good effect to help with edema.  States taking Victoza, eating healthier to help lose weight.  Feels well, denies any new concerns.  Echocardiogram 08/2016 EF 55 to 60%, impaired relaxation.  Past Medical History:  Diagnosis Date   Acid reflux    Anxiety    Arrhythmia    BP (high blood pressure) 01/25/2013   CCF (congestive cardiac failure) ([place]) 08/04/2013   CHF (congestive heart failure) (HCC)    Chronic kidney disease    Constipation    Cystocele, grade 2    Diabetes mellitus type I ([place])    Diabetes mellitus without complication ([place])    Dyspnea    Female incontinence    Hypertension    Recurrent UTI    Stroke [facility]) 2001    Past Surgical History:  Procedure Laterality Date   COLONOSCOPY  02/16/13   COLONOSCOPY WITH PROPOFOL N/A 09/14/2018   Procedure: COLONOSCOPY WITH PROPOFOL;  Surgeon: Lin Landsman, MD;  Location: [place];  Service: Gastroenterology;  Laterality: N/A;   COLONOSCOPY WITH PROPOFOL N/A 09/15/2018   Procedure: COLONOSCOPY WITH PROPOFOL;  Surgeon: Lin Landsman, MD;  Location: [facility] ENDOSCOPY;  Service: Gastroenterology;  Laterality: N/A;   elbow     pinch nerve   HAND SURGERY Bilateral 0254,[id number]   carpel tunnel   HIP SURGERY Right 1999   MULTIPLE TOOTH EXTRACTIONS  2014   TUBAL LIGATION  1985    Current Medications: Current Meds  Medication Sig    aspirin 81 MG tablet Take 81 mg by mouth daily.   atorvastatin (LIPITOR) 20 MG tablet Take 20 mg by mouth daily.   baclofen (LIORESAL) 10 MG tablet Take 1 tablet by mouth 3 (three) times daily.   Calcium Carbonate-Vitamin D (CALCIUM + D PO) Take 2 tablets by mouth daily.   carvedilol (COREG) 25 MG tablet Take 25 mg by mouth 2 (two) times daily with a meal.   citalopram (CELEXA) 40 MG tablet Take 1 tablet (40 mg total) by mouth daily.   clonazePAM (KLONOPIN) 0.25 MG disintegrating tablet Take 0.25 mg by mouth 2 (two) times daily as needed for anxiety.   cloNIDine (CATAPRES) 0.2 MG tablet Take 1 tablet by mouth 2 (two) times daily.   conjugated estrogens (PREMARIN) vaginal cream Place 1 applicator vaginally daily.   fluticasone (FLONASE) 50 MCG/ACT nasal spray Place into both nostrils as needed.   Fluticasone-Salmeterol (ADVAIR) 250-50 MCG/DOSE AEPB Inhale 1 puff into the lungs 2 (two) times daily.   furosemide (LASIX) 20 MG tablet Take 1 tablet by mouth 2 (two) times daily.   gabapentin (NEURONTIN) 100 MG capsule Take 2 capsules by mouth at bedtime.   glipiZIDE (GLUCOTROL) 5 MG tablet Take 5 mg by mouth 2 (two) times daily before a meal.    hydrALAZINE (APRESOLINE) 25 MG tablet Take 1 tablet (  25 mg total) by mouth 3 (three) times daily.   insulin glargine (LANTUS) 100 UNIT/ML injection Inject 20 Units into the skin daily.   liraglutide (VICTOZA) 18 MG/3ML SOPN 2.4 mg daily.   losartan (COZAAR) 25 MG tablet Take 25 mg by mouth daily.   mirabegron ER (MYRBETRIQ) 25 MG TB24 tablet Take 1 tablet (25 mg total) by mouth daily.   Potassium Chloride ER 20 MEQ TBCR Take 1 tablet by mouth once a day  1 tablet daily   PROAIR HFA 108 (90 BASE) MCG/ACT inhaler Inhale 2 puffs into the lungs every 8 (eight) hours.   QUEtiapine (SEROQUEL) 100 MG tablet Take 100 mg by mouth at bedtime.   solifenacin (VESICARE) 10 MG tablet Take 1 tablet (10 mg total) by mouth daily.   spironolactone (ALDACTONE) 25 MG tablet  Take 12.5 mg by mouth daily.      Allergies:   Ace inhibitors and Pravastatin   Social History   Socioeconomic History   Marital status: Married    Spouse name: Not on file   Number of children: Not on file   Years of education: Not on file   Highest education level: Not on file  Occupational History   Occupation: Retired  Tobacco Use   Smoking status: Former    Packs/day: 0.50    Years: 20.00    Total pack years: 10.00    Types: Cigarettes    Quit date: 09/15/2015    Years since quitting: 5.9   Smokeless tobacco: Current    Types: Snuff  Vaping Use   Vaping Use: Never used  Substance and Sexual Activity   Alcohol use: No    Alcohol/week: 0.0 standard drinks of alcohol   Drug use: No   Sexual activity: Never    Birth control/protection: None  Other Topics Concern   Not on file  Social History Narrative   Not on file   Social Determinants of Health   Financial Resource Strain: Not on file  Food Insecurity: Not on file  Transportation Needs: Not on file  Physical Activity: Not on file  Stress: Not on file  Social Connections: Not on file     Family History: The patient's family history includes Alcohol abuse in her brother, brother, brother, brother, brother, brother, and mother; Breast cancer in her sister; Colon cancer in her brother and father; Colon polyps in her brother and daughter; Diabetes in her daughter and mother; Drug abuse in her brother, brother, brother, brother, brother, and brother; Heart failure in her sister. There is no history of Kidney disease, Bladder Cancer, or Ovarian cancer.  ROS:   Please see the history of present illness.     All other systems reviewed and are negative.  EKGs/Labs/Other Studies Reviewed:    The following studies were reviewed today:   EKG:  EKG not ordered today.    Recent Labs: No results found for requested labs within last 365 days.  Recent Lipid Panel No results found for: "CHOL", "TRIG", "HDL", "CHOLHDL",  "VLDL", "LDLCALC", "LDLDIRECT"   Risk Assessment/Calculations:         Physical Exam:    VS:  BP 120/72 (BP Location: Left Arm, Patient Position: Sitting, Cuff Size: Large)   Pulse 83   Ht '5\' 1"'$  (1.549 m)   Wt 234 lb (106.1 kg)   SpO2 98%   BMI 44.21 kg/m     Wt Readings from Last 3 Encounters:  08/13/21 234 lb (106.1 kg)  07/02/21 235 lb (106.6 kg)  05/07/21 230 lb (104.3 kg)     GEN:  Well nourished, well developed in no acute distress HEENT: Normal NECK: No JVD; No carotid bruits CARDIAC: RRR, no murmurs, rubs, gallops RESPIRATORY:  Clear to auscultation without rales, wheezing or rhonchi  ABDOMEN: Soft, non-tender, non-distended MUSCULOSKELETAL:  No edema; No deformity  SKIN: Warm and dry NEUROLOGIC:  Alert and oriented x 3 PSYCHIATRIC:  Normal affect   ASSESSMENT:    1. Frequent PVCs   2. Primary hypertension   3. Heart failure with preserved ejection fraction, unspecified HF chronicity ([place])   4. Morbid obesity ([place])     PLAN:    In order of problems listed above:  Frequent PVCs, echo 07/2021 EF 55% continue Coreg 25 mg twice daily. Hypertension, BP controlled.  Continue Coreg, losartan, Aldactone.   HFpEF.  Appears euvolemic.  Continue Aldactone, Lasix. Morbid obesity, weight loss advised, slowly improving with Victoza.  Follow-up yearly      Medication Adjustments/Labs and Tests Ordered: Current medicines are reviewed at length with the patient today.  Concerns regarding medicines are outlined above.  No orders of the defined types were placed in this encounter.  No orders of the defined types were placed in this encounter.   Patient Instructions  Medication Instructions:  Your physician recommends that you continue on your current medications as directed. Please refer to the Current Medication list given to you today.  *If you need a refill on your cardiac medications before your next appointment, please call your pharmacy*   Lab  Work: None ordered  If you have labs (blood work) drawn today and your tests are completely normal, you will receive your results only by: [place] (if you have MyChart) OR A paper copy in the mail If you have any lab test that is abnormal or we need to change your treatment, we will call you to review the results.   Testing/Procedures: None ordered  Follow-Up: At [facility], you and your health needs are our priority.  As part of our continuing mission to provide you with exceptional heart care, we have created designated Provider Care Teams.  These Care Teams include your primary Cardiologist (physician) and Advanced Practice Providers (APPs -  Physician Assistants and Nurse Practitioners) who all work together to provide you with the care you need, when you need it.  We recommend signing up for the patient portal called "MyChart".  Sign up information is provided on this After Visit Summary.  MyChart is used to connect with patients for Virtual Visits (Telemedicine).  Patients are able to view lab/test results, encounter notes, upcoming appointments, etc.  Non-urgent messages can be sent to your provider as well.   To learn more about what you can do with MyChart, go to [email or web address].    Your next appointment:   1 year(s)  The format for your next appointment:   In Person  Provider:   You may see Dr. Garen Lah or one of the following Advanced Practice Providers on your designated Care Team:   Murray Hodgkins, NP Christell Faith, PA-C Cadence Kathlen Mody, [place]  Important Information About Sugar         Signed, Kate Sable, MD  08/13/2021 2:37 PM    [place]

## 2021-08-13 NOTE — Patient Instructions (Signed)
Medication Instructions:  Your physician recommends that you continue on your current medications as directed. Please refer to the Current Medication list given to you today.  *If you need a refill on your cardiac medications before your next appointment, please call your pharmacy*   Lab Work: None ordered  If you have labs (blood work) drawn today and your tests are completely normal, you will receive your results only by: [place] (if you have MyChart) OR A paper copy in the mail If you have any lab test that is abnormal or we need to change your treatment, we will call you to review the results.   Testing/Procedures: None ordered  Follow-Up: At [facility], you and your health needs are our priority.  As part of our continuing mission to provide you with exceptional heart care, we have created designated Provider Care Teams.  These Care Teams include your primary Cardiologist (physician) and Advanced Practice Providers (APPs -  Physician Assistants and Nurse Practitioners) who all work together to provide you with the care you need, when you need it.  We recommend signing up for the patient portal called "MyChart".  Sign up information is provided on this After Visit Summary.  MyChart is used to connect with patients for Virtual Visits (Telemedicine).  Patients are able to view lab/test results, encounter notes, upcoming appointments, etc.  Non-urgent messages can be sent to your provider as well.   To learn more about what you can do with MyChart, go to [email or web address].    Your next appointment:   1 year(s)  The format for your next appointment:   In Person  Provider:   You may see Dr. Garen Lah or one of the following Advanced Practice Providers on your designated Care Team:   Murray Hodgkins, NP Christell Faith, PA-C Cadence Kathlen Mody, PA-C{  Important Information About Sugar

## 2022-05-06 ENCOUNTER — Ambulatory Visit (INDEPENDENT_AMBULATORY_CARE_PROVIDER_SITE_OTHER): Payer: 59 | Admitting: Internal Medicine

## 2022-05-06 VITALS — BP 151/91 | HR 80 | Resp 16 | Ht 61.0 in | Wt 220.0 lb

## 2022-05-06 DIAGNOSIS — G4733 Obstructive sleep apnea (adult) (pediatric): Secondary | ICD-10-CM

## 2022-05-06 DIAGNOSIS — I10 Essential (primary) hypertension: Secondary | ICD-10-CM

## 2022-05-06 DIAGNOSIS — Z7189 Other specified counseling: Secondary | ICD-10-CM

## 2022-05-06 NOTE — Patient Instructions (Signed)

## 2022-05-06 NOTE — Progress Notes (Unsigned)
[facility] [place], [place] [address]  Pulmonary Sleep Medicine   Office Visit Note  Patient Name: Kathryn Kramer DOB: 08-May-1950 MRN [id number]    Chief Complaint: Obstructive Sleep Apnea visit  Brief History:  Kathryn Kramer is seen today for an annual follow up on BIPAP ASV.  The patient is interested in getting a new machine.  The patient has a 7 year history of sleep apnea. Patient is using PAP nightly.  The patient feels rested after sleeping with PAP.  The patient reports benefiting from PAP use. Reported sleepiness is  improved and the Epworth Sleepiness Score is 2 out of 24. The patient does take naps, about 2-3 times per week. The patient complains of the following: No complaints.  The compliance download shows  compliance with an average use time of 8 hours 17 minutes. The AHI is 1.4.  The patient does not complain of limb movements disrupting sleep.  ROS  General: (-) fever, (-) chills, (-) night sweat Nose and Sinuses: (-) nasal stuffiness or itchiness, (-) postnasal drip, (-) nosebleeds, (-) sinus trouble. Mouth and Throat: (-) sore throat, (-) hoarseness. Neck: (-) swollen glands, (-) enlarged thyroid, (-) neck pain. Respiratory: - cough, - shortness of breath, - wheezing. Neurologic: - numbness, - tingling. Psychiatric: - anxiety, - depression   Current Medication: Outpatient Encounter Medications as of 05/06/2022  Medication Sig   amLODipine (NORVASC) 10 MG tablet Take 10 mg by mouth daily.   atorvastatin (LIPITOR) 40 MG tablet Take 40 mg by mouth daily.   KLOR-CON M20 20 MEQ tablet Take 20 mEq by mouth daily.   omeprazole (PRILOSEC) 20 MG capsule Take 20 mg by mouth daily.   risedronate (ACTONEL) 35 MG tablet Take by mouth.   aspirin 81 MG tablet Take 81 mg by mouth daily.   baclofen (LIORESAL) 10 MG tablet Take 1 tablet by mouth 3 (three) times daily.   Calcium Carbonate-Vitamin D (CALCIUM + D PO) Take 2 tablets by mouth daily.   citalopram  (CELEXA) 40 MG tablet Take 1 tablet (40 mg total) by mouth daily.   cloNIDine (CATAPRES) 0.2 MG tablet Take 1 tablet by mouth 2 (two) times daily.   conjugated estrogens (PREMARIN) vaginal cream Place 1 applicator vaginally daily.   fluticasone (FLONASE) 50 MCG/ACT nasal spray Place into both nostrils as needed.   Fluticasone-Salmeterol (ADVAIR) 250-50 MCG/DOSE AEPB Inhale 1 puff into the lungs 2 (two) times daily.   furosemide (LASIX) 20 MG tablet Take 1 tablet by mouth 2 (two) times daily.   gabapentin (NEURONTIN) 100 MG capsule Take 2 capsules by mouth at bedtime.   hydrALAZINE (APRESOLINE) 100 MG tablet Take 100 mg by mouth 3 (three) times daily.   insulin glargine (LANTUS) 100 UNIT/ML injection Inject 20 Units into the skin daily.   liraglutide (VICTOZA) 18 MG/3ML SOPN 2.4 mg daily.   losartan (COZAAR) 25 MG tablet Take 25 mg by mouth daily.   mirabegron ER (MYRBETRIQ) 25 MG TB24 tablet Take 1 tablet (25 mg total) by mouth daily.   PROAIR HFA 108 (90 BASE) MCG/ACT inhaler Inhale 2 puffs into the lungs every 8 (eight) hours.   [DISCONTINUED] atorvastatin (LIPITOR) 20 MG tablet Take 20 mg by mouth daily.   [DISCONTINUED] carvedilol (COREG) 25 MG tablet Take 25 mg by mouth 2 (two) times daily with a meal.   [DISCONTINUED] clonazePAM (KLONOPIN) 0.25 MG disintegrating tablet Take 0.25 mg by mouth 2 (two) times daily as needed for anxiety.   [DISCONTINUED]  glipiZIDE (GLUCOTROL) 5 MG tablet Take 5 mg by mouth 2 (two) times daily before a meal.    [DISCONTINUED] hydrALAZINE (APRESOLINE) 25 MG tablet Take 1 tablet (25 mg total) by mouth 3 (three) times daily.   [DISCONTINUED] Potassium Chloride ER 20 MEQ TBCR Take 1 tablet by mouth once a day  1 tablet daily   [DISCONTINUED] QUEtiapine (SEROQUEL) 100 MG tablet Take 100 mg by mouth at bedtime.   [DISCONTINUED] solifenacin (VESICARE) 10 MG tablet Take 1 tablet (10 mg total) by mouth daily.   [DISCONTINUED] spironolactone (ALDACTONE) 25 MG tablet Take  12.5 mg by mouth daily.    No facility-administered encounter medications on file as of 05/06/2022.    Surgical History: Past Surgical History:  Procedure Laterality Date   COLONOSCOPY  02/16/13   COLONOSCOPY WITH PROPOFOL N/A 09/14/2018   Procedure: COLONOSCOPY WITH PROPOFOL;  Surgeon: Lin Landsman, MD;  Location[facility] ENDOSCOPY;  Service: Gastroenterology;  Laterality: N/A;   COLONOSCOPY WITH PROPOFOL N/A 09/15/2018   Procedure: COLONOSCOPY WITH PROPOFOL;  Surgeon: Lin Landsman, MD;  Location: [facility] ENDOSCOPY;  Service: Gastroenterology;  Laterality: N/A;   elbow     pinch nerve   HAND SURGERY Bilateral [id number]   carpel tunnel   HIP SURGERY Right 1999   MULTIPLE TOOTH EXTRACTIONS  2014   TUBAL LIGATION  1985    Medical History: Past Medical History:  Diagnosis Date   Acid reflux    Anxiety    Arrhythmia    BP (high blood pressure) 01/25/2013   CCF (congestive cardiac failure) ([place]) 08/04/2013   CHF (congestive heart failure) ([facility])    Chronic kidney disease    Constipation    Cystocele, grade 2    Diabetes mellitus type I ([place])    Diabetes mellitus without complication ([place])    Dyspnea    Female incontinence    Hypertension    Recurrent UTI    Stroke ([place]) 2001    Family History: Non contributory to the present illness  Social History: Social History   Socioeconomic History   Marital status: Married    Spouse name: Not on file   Number of children: Not on file   Years of education: Not on file   Highest education level: Not on file  Occupational History   Occupation: Retired  Tobacco Use   Smoking status: Former    Packs/day: 0.50    Years: 20.00    Total pack years: 10.00    Types: Cigarettes    Quit date: 09/15/2015    Years since quitting: 6.6   Smokeless tobacco: Current    Types: Snuff  Vaping Use   Vaping Use: Never used  Substance and Sexual Activity   Alcohol use: No    Alcohol/week: 0.0 standard drinks of alcohol   Drug use: No    Sexual activity: Never    Birth control/protection: None  Other Topics Concern   Not on file  Social History Narrative   Not on file   Social Determinants of Health   Financial Resource Strain: Not on file  Food Insecurity: Not on file  Transportation Needs: Not on file  Physical Activity: Not on file  Stress: Not on file  Social Connections: Not on file  Intimate Partner Violence: Not on file    Vital Signs: Blood pressure (!) 151/91, pulse 80, resp. rate 16, height '5\' 1"'$  (1.549 m), weight 220 lb (99.8 kg), SpO2 98 %. Body mass index is 41.57 kg/m.    Examination:  General Appearance: The patient is well-developed, well-nourished, and in no distress. Neck Circumference: 39 cm Skin: Gross inspection of skin unremarkable. Head: normocephalic, no gross deformities. Eyes: no gross deformities noted. ENT: ears appear grossly normal Neurologic: Alert and oriented. No involuntary movements.  STOP BANG RISK ASSESSMENT S (snore) Have you been told that you snore?     NO   T (tired) Are you often tired, fatigued, or sleepy during the day?   NO  O (obstruction) Do you stop breathing, choke, or gasp during sleep? NO   P (pressure) Do you have or are you being treated for high blood pressure? YES   B (BMI) Is your body index greater than 35 kg/m? YES   A (age) Are you 14 years old or older? YES   N (neck) Do you have a neck circumference greater than 16 inches?   NO   G (gender) Are you a female? NO   TOTAL STOP/BANG "YES" ANSWERS 3       A STOP-Bang score of 2 or less is considered low risk, and a score of 5 or more is high risk for having either moderate or severe OSA. For people who score 3 or 4, doctors may need to perform further assessment to determine how likely they are to have OSA.         EPWORTH SLEEPINESS SCALE:  Scale:  (0)= no chance of dozing; (1)= slight chance of dozing; (2)= moderate chance of dozing; (3)= high chance of dozing  Chance  Situtation     Sitting and reading: 0    Watching TV: 1    Sitting Inactive in public: 0    As a passenger in car: 0      Lying down to rest: 1    Sitting and talking: 0    Sitting quielty after lunch: 0    In a car, stopped in traffic: 0   TOTAL SCORE:   2 out of 24    SLEEP STUDIES:  PSG (07/23/18)  AHI 96.7, min SPO2 82%   CPAP COMPLIANCE DATA:  Date Range: 05/06/21 - 05/05/22  Average Daily Use: 8 hours 17 minutes  Median Use: 8 hours 23 minutes  Compliance for > 4 Hours: 361 days  AHI: 1.4 respiratory events per hour  Days Used: 361/365  Mask Leak: 5.3%  95th Percentile Pressure: ASV         LABS: No results found for this or any previous visit (from the past 2160 hour(s)).  Radiology: US AORTA MEDICARE SCREENING  Result Date: 06/11/2021 CLINICAL DATA:  Patient between 20-20 years of age with a smoking history. EXAM: US ABDOMINAL AORTA MEDICARE SCREENING TECHNIQUE: Ultrasound examination of the abdominal aorta was performed as a screening evaluation for abdominal aortic aneurysm. COMPARISON:  None. FINDINGS: Abdominal aortic measurements as follows: Proximal:  2.9 cm Mid:  2.0 cm Distal:  2.0 cm IMPRESSION: No sonographic evidence of abdominal aortic aneurysm. No follow-up recommended. This recommendation follows ACR consensus guidelines: White Paper of the ACR Incidental Findings Committee II on Vascular Findings. J Am Coll Radiol 2013; 10:789-794. Electronically Signed   By: Marijo Conception M.D.   On: 06/11/2021 11:04    No results found.  No results found.    Assessment and Plan: Patient Active Problem List   Diagnosis Date Noted   OSA treated with BiPAP 05/07/2021   Encounter for BiPAP use counseling 05/07/2021   Benign essential HTN 04/04/2017   Hyperlipidemia, mixed 04/04/2017   Bradycardia  03/19/2017   Headache disorder 08/19/2016   Angioedema 07/26/2016   Urinary incontinence, mixed 07/06/2015   Morbid (severe) obesity due to excess  calories ([place]) 01/19/2015   Morbid obesity ([place]) 12/07/2014   Recurrent UTI 10/29/2014   Extreme obesity 08/09/2014   Cystocele, grade 2 08/09/2014   Atrophic vaginitis 08/09/2014   Incontinence 08/09/2014   Compulsive tobacco user syndrome 05/09/2014   Current tobacco use 05/09/2014   Hx of adenomatous colonic polyps 02/21/2014   CCF (congestive cardiac failure) ([place]) 08/04/2013   CHF (congestive heart failure) ([place]) 08/04/2013   Chronic kidney disease (CKD), stage III (moderate) ([place]) 01/25/2013   Diabetes ([place]) 01/25/2013   BP (high blood pressure) 01/25/2013   Angiopathy, 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 OSA treated with BiPAP The patient does tolerate PAP and reports  benefit from PAP use. Kathryn Kramer current Bipap ASV is under recall and in need of replacement. Bipap ASV continues to be medically necessary to treat complex sleep apnea. A replacement machine will assure safe and reliable treatment for Kathryn Kramer complex sleep apnea.  The patient was reminded how to clean equipment and advised to replace supplies routinely. The patient was also counselled on weight loss. The compliance is excellent . The AHI is 1.4.   OSA treated with bipap asv, controlled. Replace machine. F/u 30d after setup.   2. Encounter for BiPAP use counseling  Counseling: had a lengthy discussion with the patient regarding the importance of PAP therapy in management of the sleep apnea. Patient appears to understand the risk factor reduction and also understands the risks associated with untreated sleep apnea. Patient will try to make a good faith effort to remain compliant with therapy. Also instructed the patient on proper cleaning of the device including the water must be changed daily if possible and use of distilled water is preferred. Patient understands that the machine should be  regularly cleaned with appropriate recommended cleaning solutions that do not damage the PAP machine for example given white vinegar and water rinses. Other methods such as ozone treatment may not be as good as these simple methods to achieve cleaning.   3. Hypertension, unspecified type Hypertension Counseling:   The following hypertensive lifestyle modification were recommended and discussed:  1. Limiting alcohol intake to less than 1 oz/day of ethanol:(24 oz of beer or 8 oz of wine or 2 oz of 100-proof whiskey). 2. Take baby ASA 81 mg daily. 3. Importance of regular aerobic exercise and losing weight. 4. Reduce dietary saturated fat and cholesterol intake for overall cardiovascular health. 5. Maintaining adequate dietary potassium, calcium, and magnesium intake. 6. Regular monitoring of the blood pressure. 7. Reduce sodium intake to less than 100 mmol/day (less than 2.3 gm of sodium or less than 6 gm of sodium choride)    4. Morbid obesity ([place]) Obesity Counseling: Had a lengthy discussion regarding patients BMI and weight issues. Patient was instructed on portion control as well as increased activity. Also discussed caloric restrictions with trying to maintain intake less than 2000 Kcal. Discussions were made in accordance with the 5As of weight management. Simple actions such as not eating late and if able to, taking a walk is suggested.    General Counseling: I have discussed the findings of the evaluation and examination with Kathryn Kramer.  I have also discussed any further diagnostic evaluation thatmay be needed or ordered today. Kathryn Kramer verbalizes  understanding of the findings of todays visit. We also reviewed Kathryn Kramer medications today and discussed drug interactions and side effects including but not limited excessive drowsiness and altered mental states. We also discussed that there is always a risk not just to Kathryn Kramer but also people around Kathryn Kramer. she has been encouraged to call the office with any questions  or concerns that should arise related to todays visit.  No orders of the defined types were placed in this encounter.       I have personally obtained a history, examined the patient, evaluated laboratory and imaging results, formulated the assessment and plan and placed orders. This patient was seen today by Tressie Ellis, PA-C in collaboration with Dr. Devona Konig.   Allyne Gee, MD [facility] Diplomate ABMS Pulmonary Critical Care Medicine and Sleep Medicine

## 2022-08-01 ENCOUNTER — Telehealth: Payer: Self-pay | Admitting: Internal Medicine

## 2022-08-01 NOTE — Telephone Encounter (Signed)
Received cpap supply order from [employer]. Gave to dsk for signature-Toni

## 2022-08-01 NOTE — Telephone Encounter (Signed)
Cpap supply order faxed back to [facility]; [phone]. Scanned-Toni

## 2022-10-18 ENCOUNTER — Emergency Department
Admission: EM | Admit: 2022-10-18 | Discharge: 2022-10-18 | Disposition: A | Discharge status: Home / Self Care | Payer: 59 | Source: Home / Self Care | Attending: Emergency Medicine | Admitting: Emergency Medicine

## 2022-10-18 ENCOUNTER — Other Ambulatory Visit: Payer: Self-pay

## 2022-10-18 ENCOUNTER — Emergency Department: Payer: 59

## 2022-10-18 ENCOUNTER — Encounter: Payer: Self-pay | Admitting: Medical Oncology

## 2022-10-18 DIAGNOSIS — I509 Heart failure, unspecified: Secondary | ICD-10-CM | POA: Diagnosis not present

## 2022-10-18 DIAGNOSIS — I779 Disorder of arteries and arterioles, unspecified: Secondary | ICD-10-CM | POA: Diagnosis present

## 2022-10-18 DIAGNOSIS — E1122 Type 2 diabetes mellitus with diabetic chronic kidney disease: Secondary | ICD-10-CM | POA: Insufficient documentation

## 2022-10-18 DIAGNOSIS — I130 Hypertensive heart and chronic kidney disease with heart failure and stage 1 through stage 4 chronic kidney disease, or unspecified chronic kidney disease: Secondary | ICD-10-CM | POA: Insufficient documentation

## 2022-10-18 DIAGNOSIS — G4733 Obstructive sleep apnea (adult) (pediatric): Secondary | ICD-10-CM | POA: Diagnosis present

## 2022-10-18 DIAGNOSIS — I5032 Chronic diastolic (congestive) heart failure: Secondary | ICD-10-CM | POA: Insufficient documentation

## 2022-10-18 DIAGNOSIS — N189 Chronic kidney disease, unspecified: Secondary | ICD-10-CM | POA: Insufficient documentation

## 2022-10-18 DIAGNOSIS — I10 Essential (primary) hypertension: Secondary | ICD-10-CM | POA: Diagnosis present

## 2022-10-18 DIAGNOSIS — N390 Urinary tract infection, site not specified: Secondary | ICD-10-CM | POA: Diagnosis present

## 2022-10-18 DIAGNOSIS — R1032 Left lower quadrant pain: Secondary | ICD-10-CM

## 2022-10-18 DIAGNOSIS — E119 Type 2 diabetes mellitus without complications: Secondary | ICD-10-CM

## 2022-10-18 LAB — URINALYSIS, ROUTINE W REFLEX MICROSCOPIC
Bilirubin Urine: NEGATIVE
Glucose, UA: NEGATIVE mg/dL
Hgb urine dipstick: NEGATIVE
Ketones, ur: NEGATIVE mg/dL
Nitrite: NEGATIVE
Protein, ur: >=300 mg/dL — AB
Specific Gravity, Urine: 1.024 (ref 1.005–1.030)
pH: 5 (ref 5.0–8.0)

## 2022-10-18 LAB — COMPREHENSIVE METABOLIC PANEL
ALT: 17 U/L (ref 0–44)
AST: 21 U/L (ref 15–41)
Albumin: 4 g/dL (ref 3.5–5.0)
Alkaline Phosphatase: 69 U/L (ref 38–126)
Anion gap: 11 (ref 5–15)
BUN: 12 mg/dL (ref 8–23)
CO2: 21 mmol/L — ABNORMAL LOW (ref 22–32)
Calcium: 9.4 mg/dL (ref 8.9–10.3)
Chloride: 103 mmol/L (ref 98–111)
Creatinine, Ser: 1.81 mg/dL — ABNORMAL HIGH (ref 0.44–1.00)
GFR, Estimated: 29 mL/min — ABNORMAL LOW (ref >60)
Glucose, Bld: 100 mg/dL — ABNORMAL HIGH (ref 70–99)
Potassium: 3.5 mmol/L (ref 3.5–5.1)
Sodium: 135 mmol/L (ref 135–145)
Total Bilirubin: 0.5 mg/dL (ref 0.3–1.2)
Total Protein: 7.5 g/dL (ref 6.5–8.1)

## 2022-10-18 LAB — CBC
HCT: 39.6 % (ref 36.0–46.0)
Hemoglobin: 12.4 g/dL (ref 12.0–15.0)
MCH: 28.2 pg (ref 26.0–34.0)
MCHC: 31.3 g/dL (ref 30.0–36.0)
MCV: 90 fL (ref 80.0–100.0)
Platelets: 277 10*3/uL (ref 150–400)
RBC: 4.4 MIL/uL (ref 3.87–5.11)
RDW: 13.4 % (ref 11.5–15.5)
WBC: 9.7 10*3/uL (ref 4.0–10.5)
nRBC: 0 % (ref 0.0–0.2)

## 2022-10-18 LAB — LIPASE, BLOOD: Lipase: 44 U/L (ref 11–51)

## 2022-10-18 MED ORDER — LIDOCAINE 5 % EX PTCH: 1.0000 | MEDICATED_PATCH | Freq: Two times a day (BID) | CUTANEOUS | 0 refills | Status: AC

## 2022-10-18 NOTE — ED Notes (Signed)
Patient ambulated to hallway bathroom with a steady gait. Patient states she doesn't feel the need to void, but will try.

## 2022-10-18 NOTE — ED Provider Notes (Signed)
[facility] Provider Note    Event Date/Time   First MD Initiated Contact with Patient 10/18/22 1829     (approximate)   History   Chief Complaint Abdominal Pain   HPI  Kathryn Kramer is a 72 y.o. female with past medical history of hypertension, diabetes, CHF, CKD, and recurrent UTI who presents to the ED complaining of abdominal pain.  Patient reports that she has had approximately 1 month of constant pain in the left flank and left lower quadrant of her abdomen.  Pain has become increasingly severe over the past couple of days, seems to be exacerbated by certain position changes.  She has not had any fevers, dysuria, hematuria, nausea, vomiting, or diarrhea.  She states that she has been eating and drinking normally, denies any history of similar symptoms.     Physical Exam   Triage Vital Signs: ED Triage Vitals [10/18/22 1607]  Encounter Vitals Group     BP 137/77     Systolic BP Percentile      Diastolic BP Percentile      Pulse Rate 81     Resp 19     Temp 99 F (37.2 C)     Temp Source Oral     SpO2 95 %     Weight 230 lb (104.3 kg)     Height 5\' 1"  (1.549 m)     Head Circumference      Peak Flow      Pain Score 10     Pain Loc      Pain Education      Exclude from Growth Chart     Most recent vital signs: Vitals:   10/18/22 1607  BP: 137/77  Pulse: 81  Resp: 19  Temp: 99 F (37.2 C)  SpO2: 95%    Constitutional: Alert and oriented. Eyes: Conjunctivae are normal. Head: Atraumatic. Nose: No congestion/rhinnorhea. Mouth/Throat: Mucous membranes are moist.  Cardiovascular: Normal rate, regular rhythm. Grossly normal heart sounds.  2+ radial pulses bilaterally. Respiratory: Normal respiratory effort.  No retractions. Lungs CTAB. Gastrointestinal: Soft and tender to palpation of the left lower quadrant with no rebound or guarding. No distention. Musculoskeletal: No lower extremity tenderness nor edema.  Neurologic:  Normal  speech and language. No gross focal neurologic deficits are appreciated.    ED Results / Procedures / Treatments   Labs (all labs ordered are listed, but only abnormal results are displayed) Labs Reviewed  COMPREHENSIVE METABOLIC PANEL - Abnormal; Notable for the following components:      Result Value   CO2 21 (*)    Glucose, Bld 100 (*)    Creatinine, Ser 1.81 (*)    GFR, Estimated 29 (*)    All other components within normal limits  URINALYSIS, ROUTINE W REFLEX MICROSCOPIC - Abnormal; Notable for the following components:   Color, Urine AMBER (*)    APPearance CLOUDY (*)    Protein, ur >=300 (*)    Leukocytes,Ua MODERATE (*)    Bacteria, UA RARE (*)    All other components within normal limits  LIPASE, BLOOD  CBC   RADIOLOGY CT abdomen/pelvis reviewed and interpreted by me with no focal fluid collections, dilated bowel loops, or inflammatory changes.  PROCEDURES:  Critical Care performed: No  Procedures   MEDICATIONS ORDERED IN ED: Medications - No data to display   IMPRESSION / MDM / ASSESSMENT AND PLAN / ED COURSE  I reviewed the triage vital signs and the nursing notes.  72 y.o. female with past medical history of hypertension, diabetes, CHF, CKD, and recurrent UTI who presents to the ED complaining of 1 month of worsening pain in the left lower quadrant of her abdomen.  Patient's presentation is most consistent with acute presentation with potential threat to life or bodily function.  Differential diagnosis includes, but is not limited to, diverticulitis, abscess, UTI, kidney stone, muscle strain.  Patient nontoxic-appearing and in no acute distress, vital signs are unremarkable.  Her abdomen is soft but she does have focal tenderness in the left lower quadrant of her abdomen.  We will further assess with CT imaging, labs are reassuring with no significant anemia, leukocytosis, tract abnormality, or AKI.  CKD stable compared to  previous, LFTs and lipase are unremarkable.  Patient declines pain or nausea medication, will reassess following CT imaging.  CT abdomen/pelvis is negative for acute process, urinalysis appears contaminated but no urinary symptoms to suggest UTI.  Suspect muscle strain given pain worse with positional changes.  Patient appropriate for outpatient management and we will prescribe Lidoderm patches.  She was counseled to follow-up with her PCP and return to the ED for new or worsening symptoms.  Patient agrees with plan.      FINAL CLINICAL IMPRESSION(S) / ED DIAGNOSES   Final diagnoses:  Left lower quadrant abdominal pain     Rx / DC Orders   ED Discharge Orders          Ordered    lidocaine (LIDODERM) 5 %  Every 12 hours        10/18/22 1953             Note:  This document was prepared using Dragon voice recognition software and may include unintentional dictation errors.   Chesley Noon, MD 10/18/22 [phone]

## 2022-10-18 NOTE — ED Notes (Signed)
Results of UA reviewed with Gala Romney,  PA-C.

## 2022-10-18 NOTE — Hospital Course (Signed)
chronic diastolic CHF, diabetes, hypertension, chronic kidney disease stage II, history of previous CVA 2020, frequent PVCs, treated with Coreg and history of recurrent UTIs

## 2022-10-18 NOTE — ED Triage Notes (Signed)
Pt reports left lower abd pain that began 1 month ago. Pain worsens with movement. Denies NVD, fever or urinary sx's.

## 2022-12-23 ENCOUNTER — Encounter: Payer: Self-pay | Admitting: Cardiology

## 2022-12-23 ENCOUNTER — Ambulatory Visit: Payer: 59 | Attending: Cardiology | Admitting: Cardiology

## 2022-12-23 VITALS — BP 124/78 | HR 76 | Ht 61.0 in | Wt 213.2 lb

## 2022-12-23 DIAGNOSIS — I10 Essential (primary) hypertension: Secondary | ICD-10-CM | POA: Diagnosis not present

## 2022-12-23 DIAGNOSIS — I5030 Unspecified diastolic (congestive) heart failure: Secondary | ICD-10-CM | POA: Diagnosis not present

## 2022-12-23 DIAGNOSIS — I493 Ventricular premature depolarization: Secondary | ICD-10-CM

## 2022-12-23 NOTE — Progress Notes (Signed)
Cardiology Office Note:    Date:  12/23/2022   ID:  Kathryn Kramer, DOB [birth year], MRN [id number]  PCP:  Kathryn Askew, MD   [facility] Providers Cardiologist:  Debbe Odea, MD     Referring MD: Kathryn Askew, MD   Chief Complaint  Patient presents with   Follow-up    Patient denies new or acute cardiac problems/concerns today.      History of Present Illness:    Kathryn Kramer is a 72 y.o. female with a hx of HFpEF, hypertension, hyperlipidemia, diabetes,  PVCs, CKD, CVA 2020, former smoker x8 years who presents for follow-up.   States doing okay, eating healthier trying to lose weight.  Taking Victoza has also helped with curbing appetite and helping with weight loss.  Hydralazine recently reduced by nephrology.  Blood pressure is adequately controlled.  Prior notes/testing 07/2021 EF 55%, mild MR, moderately dilated left atrium Echocardiogram 08/2016 EF 55 to 60%, impaired relaxation.  Past Medical History:  Diagnosis Date   Acid reflux    Anxiety    Arrhythmia    BP (high blood pressure) 01/25/2013   [facility]F (congestive cardiac failure) (HCC) 08/04/2013   CHF (congestive heart failure) (HCC)    Chronic kidney disease    Constipation    Cystocele, grade 2    Diabetes mellitus type I (HCC)    Diabetes mellitus without complication (HCC)    Dyspnea    Female incontinence    Hypertension    Recurrent UTI    Stroke [facility]) 2001    Past Surgical History:  Procedure Laterality Date   COLONOSCOPY  02/16/13   COLONOSCOPY WITH PROPOFOL N/A 09/14/2018   Procedure: COLONOSCOPY WITH PROPOFOL;  Surgeon: Toney Reil, MD;  Location: [facility] ENDOSCOPY;  Service: Gastroenterology;  Laterality: N/A;   COLONOSCOPY WITH PROPOFOL N/A 09/15/2018   Procedure: COLONOSCOPY WITH PROPOFOL;  Surgeon: Toney Reil, MD;  Location: [facility] ENDOSCOPY;  Service: Gastroenterology;  Laterality: N/A;   elbow     pinch nerve   HAND SURGERY Bilateral 4098,1191   carpel tunnel   HIP  SURGERY Right 1999   MULTIPLE TOOTH EXTRACTIONS  2014   TUBAL LIGATION  1985    Current Medications: Current Meds  Medication Sig   amLODipine (NORVASC) 10 MG tablet Take 10 mg by mouth daily.   aspirin 81 MG tablet Take 81 mg by mouth daily.   atorvastatin (LIPITOR) 40 MG tablet Take 40 mg by mouth daily.   baclofen (LIORESAL) 10 MG tablet Take 1 tablet by mouth 3 (three) times daily.   Calcium Carbonate-Vitamin D (CALCIUM + D PO) Take 2 tablets by mouth daily.   citalopram (CELEXA) 40 MG tablet Take 1 tablet (40 mg total) by mouth daily.   cloNIDine (CATAPRES) 0.2 MG tablet Take 1 tablet by mouth 2 (two) times daily.   conjugated estrogens (PREMARIN) vaginal cream Place 1 applicator vaginally daily.   fluticasone (FLONASE) 50 MCG/ACT nasal spray Place into both nostrils as needed.   Fluticasone-Salmeterol (ADVAIR) 250-50 MCG/DOSE AEPB Inhale 1 puff into the lungs 2 (two) times daily.   furosemide (LASIX) 20 MG tablet Take 1 tablet by mouth 2 (two) times daily.   gabapentin (NEURONTIN) 100 MG capsule Take 2 capsules by mouth at bedtime.   hydrALAZINE (APRESOLINE) 50 MG tablet Take 50 mg by mouth 3 (three) times daily.   insulin glargine (LANTUS) 100 UNIT/ML injection Inject 10 Units into the skin daily.   KLOR-CON M20 20 MEQ tablet Take  20 mEq by mouth daily.   lidocaine (LIDODERM) 5 % Place 1 patch onto the skin every 12 (twelve) hours. Remove & Discard patch within 12 hours or as directed by MD   liraglutide (VICTOZA) 18 MG/3ML SOPN 2.4 mg daily.   losartan (COZAAR) 25 MG tablet Take 25 mg by mouth daily.   mirabegron ER (MYRBETRIQ) 25 MG TB24 tablet Take 1 tablet (25 mg total) by mouth daily.   omeprazole (PRILOSEC) 20 MG capsule Take 20 mg by mouth daily.   PROAIR HFA 108 (90 BASE) MCG/ACT inhaler Inhale 2 puffs into the lungs every 8 (eight) hours.   risedronate (ACTONEL) 35 MG tablet Take by mouth.     Allergies:   Ace inhibitors and Pravastatin   Social History    Socioeconomic History   Marital status: Married    Spouse name: Not on file   Number of children: Not on file   Years of education: Not on file   Highest education level: Not on file  Occupational History   Occupation: Retired  Tobacco Use   Smoking status: Former    Current packs/day: 0.00    Average packs/day: 0.5 packs/day for 20.0 years (10.0 ttl pk-yrs)    Types: Cigarettes    Start date: 09/15/1995    Quit date: 09/15/2015    Years since quitting: 7.2   Smokeless tobacco: Current    Types: Snuff  Vaping Use   Vaping status: Never Used  Substance and Sexual Activity   Alcohol use: No    Alcohol/week: 0.0 standard drinks of alcohol   Drug use: No   Sexual activity: Never    Birth control/protection: None  Other Topics Concern   Not on file  Social History Narrative   Not on file   Social Determinants of Health   Financial Resource Strain: Not on file  Food Insecurity: Not on file  Transportation Needs: Not on file  Physical Activity: Not on file  Stress: Not on file  Social Connections: Not on file     Family History: The patient's family history includes Alcohol abuse in her brother, brother, brother, brother, brother, brother, and mother; Breast cancer in her sister; Colon cancer in her brother and father; Colon polyps in her brother and daughter; Diabetes in her daughter and mother; Drug abuse in her brother, brother, brother, brother, brother, and brother; Heart failure in her sister. There is no history of Kidney disease, Bladder Cancer, or Ovarian cancer.  ROS:   Please see the history of present illness.     All other systems reviewed and are negative.  EKGs/Labs/Other Studies Reviewed:    The following studies were reviewed today:   EKG:  EKG ordered today showed sinus rhythm with occasional PVCs, heart rate 76.    Recent Labs: 10/18/2022: ALT 17; BUN 12; Creatinine, Ser 1.81; Hemoglobin 12.4; Platelets 277; Potassium 3.5; Sodium 135  Recent Lipid  Panel No results found for: "CHOL", "TRIG", "HDL", "CHOLHDL", "VLDL", "LDLCALC", "LDLDIRECT"   Risk Assessment/Calculations:         Physical Exam:    VS:  BP 124/78 (BP Location: Left Arm, Patient Position: Sitting, Cuff Size: Large)   Pulse 76   Ht 5\' 1"  (1.549 m)   Wt 213 lb 3.2 oz (96.7 kg)   SpO2 98%   BMI 40.28 kg/m     Wt Readings from Last 3 Encounters:  12/23/22 213 lb 3.2 oz (96.7 kg)  10/18/22 230 lb (104.3 kg)  05/06/22 220 lb (99.8 kg)  GEN:  Well nourished, well developed in no acute distress HEENT: Normal NECK: No JVD; No carotid bruits CARDIAC: RRR, no murmurs, rubs, gallops RESPIRATORY:  Clear to auscultation without rales, wheezing or rhonchi  ABDOMEN: Soft, non-tender, non-distended MUSCULOSKELETAL:  No edema; No deformity  SKIN: Warm and dry NEUROLOGIC:  Alert and oriented x 3 PSYCHIATRIC:  Normal affect   ASSESSMENT:    1. PVC (premature ventricular contraction)   2. Primary hypertension   3. Heart failure with preserved ejection fraction, unspecified HF chronicity (HCC)   4. Morbid obesity (HCC)    PLAN:    In order of problems listed above:  Occasional PVCs on EKG, echo 07/2021 EF 55% .  Continue to monitor. Hypertension, BP controlled.  Continue losartan 25, hydralazine, Norvasc 10. HFpEF.  Appears euvolemic.  Continue Lasix 20 mg twice daily. Morbid obesity, low-calorie diet, weight loss advised, slowly improving with Victoza.  Follow-up yearly     Medication Adjustments/Labs and Tests Ordered: Current medicines are reviewed at length with the patient today.  Concerns regarding medicines are outlined above.  Orders Placed This Encounter  Procedures   EKG 12-Lead   No orders of the defined types were placed in this encounter.   Patient Instructions  Medication Instructions:   Your physician recommends that you continue on your current medications as directed. Please refer to the Current Medication list given to you  today. *If you need a refill on your cardiac medications before your next appointment, please call your pharmacy*   Lab Work:  None Ordered  If you have labs (blood work) drawn today and your tests are completely normal, you will receive your results only by: MyChart Message (if you have MyChart) OR A paper copy in the mail If you have any lab test that is abnormal or we need to change your treatment, we will call you to review the results.   Testing/Procedures:  None Ordered   Follow-Up: At [facility], you and your health needs are our priority.  As part of our continuing mission to provide you with exceptional heart care, we have created designated Provider Care Teams.  These Care Teams include your primary Cardiologist (physician) and Advanced Practice Providers (APPs -  Physician Assistants and Nurse Practitioners) who all work together to provide you with the care you need, when you need it.  We recommend signing up for the patient portal called "MyChart".  Sign up information is provided on this After Visit Summary.  MyChart is used to connect with patients for Virtual Visits (Telemedicine).  Patients are able to view lab/test results, encounter notes, upcoming appointments, etc.  Non-urgent messages can be sent to your provider as well.   To learn more about what you can do with MyChart, go to [email or web address].    Your next appointment:   12 month(s)  Provider:   You may see Debbe Odea, MD or one of the following Advanced Practice Providers on your designated Care Team:   Nicolasa Ducking, NP Eula Listen, PA-C Cadence Fransico Michael, PA-C Charlsie Quest, NP   Signed, Debbe Odea, MD  12/23/2022 3:28 PM    [facility]

## 2022-12-23 NOTE — Patient Instructions (Signed)
Medication Instructions:   Your physician recommends that you continue on your current medications as directed. Please refer to the Current Medication list given to you today.  *If you need a refill on your cardiac medications before your next appointment, please call your pharmacy*   Lab Work:  None Ordered  If you have labs (blood work) drawn today and your tests are completely normal, you will receive your results only by: MyChart Message (if you have MyChart) OR A paper copy in the mail If you have any lab test that is abnormal or we need to change your treatment, we will call you to review the results.   Testing/Procedures:  None Ordered    Follow-Up: At [facility], you and your health needs are our priority.  As part of our continuing mission to provide you with exceptional heart care, we have created designated Provider Care Teams.  These Care Teams include your primary Cardiologist (physician) and Advanced Practice Providers (APPs -  Physician Assistants and Nurse Practitioners) who all work together to provide you with the care you need, when you need it.  We recommend signing up for the patient portal called "MyChart".  Sign up information is provided on this After Visit Summary.  MyChart is used to connect with patients for Virtual Visits (Telemedicine).  Patients are able to view lab/test results, encounter notes, upcoming appointments, etc.  Non-urgent messages can be sent to your provider as well.   To learn more about what you can do with MyChart, go to [email or web address].    Your next appointment:   12 month(s)  Provider:   You may see Brian Agbor-Etang, MD or one of the following Advanced Practice Providers on your designated Care Team:   Christopher Berge, NP Ryan Dunn, PA-C Cadence Furth, PA-C Sheri Hammock, NP  

## 2023-04-14 ENCOUNTER — Telehealth: Payer: Self-pay

## 2023-04-14 NOTE — Telephone Encounter (Signed)
 Pt requested call back to schedule colonoscopy

## 2023-04-14 NOTE — Telephone Encounter (Signed)
 okay

## 2023-04-14 NOTE — Telephone Encounter (Signed)
 Pt contacted office to schedule her colonoscopy.  Colonoscopy is not due until 09/15/2023.  Last performed with Dr. Baldomero Bone.  Will call patient back once schedule opens up.  Thanks,  [place], [place]

## 2023-06-27 NOTE — Progress Notes (Unsigned)
 [facility] [address] [place], [place] [address]  Pulmonary Sleep Medicine   Office Visit Note  Patient Name: Kathryn Kramer DOB: 73 years old MRN [id number]    Chief Complaint: Obstructive Sleep Apnea visit  Brief History:  Kathryn Kramer is seen today for an annual follow up visit. Patient is meant to be on BiPAP ASV@ EPAP min 8 max 15, PS min 4 max 15, RR auto. However, patient has been using a CPAP@ 10 cmH2O. The patient has a 8 year history of sleep apnea. Patient is using PAP nightly.  The patient feels rested after sleeping with PAP.  The patient reports benefiting from PAP use. Reported sleepiness is  improved and the Epworth Sleepiness Score is 6 out of 24. The patient will occasionally take naps. The patient complains of the following: none.  The compliance download shows  97% compliance with an average use time of 8 hours 2 minutes. The AHI is 4.3.  The patient does complain of limb movements in her right leg only disrupting sleep. Patient continues to require PAP therapy in order to eliminate sleep apnea.   ROS  General: (-) fever, (-) chills, (-) night sweat Nose and Sinuses: (-) nasal stuffiness or itchiness, (-) postnasal drip, (-) nosebleeds, (-) sinus trouble. Mouth and Throat: (-) sore throat, (-) hoarseness. Neck: (-) swollen glands, (-) enlarged thyroid, (-) neck pain. Respiratory: - cough, - shortness of breath, - wheezing. Neurologic: - numbness, - tingling. Psychiatric: + anxiety, - depression   Current Medication: Outpatient Encounter Medications as of 06/30/2023  Medication Sig   atorvastatin  (LIPITOR) 80 MG tablet Take 1 tablet by mouth daily.   OZEMPIC, 1 MG/DOSE, 4 MG/3ML SOPN    amLODipine  (NORVASC ) 10 MG tablet Take 10 mg by mouth daily.   aspirin  81 MG tablet Take 81 mg by mouth daily.   baclofen  (LIORESAL ) 10 MG tablet Take 1 tablet by mouth 3 (three) times daily.   Calcium  Carbonate-Vitamin D (CALCIUM  + D PO) Take 2 tablets by mouth daily.    citalopram  (CELEXA ) 40 MG tablet Take 1 tablet (40 mg total) by mouth daily.   cloNIDine  (CATAPRES ) 0.2 MG tablet Take 1 tablet by mouth 2 (two) times daily.   conjugated estrogens  (PREMARIN ) vaginal cream Place 1 applicator vaginally daily.   fluticasone (FLONASE) 50 MCG/ACT nasal spray Place into both nostrils as needed.   Fluticasone-Salmeterol (ADVAIR) 250-50 MCG/DOSE AEPB Inhale 1 puff into the lungs 2 (two) times daily.   furosemide  (LASIX ) 20 MG tablet Take 1 tablet by mouth 2 (two) times daily.   gabapentin (NEURONTIN) 100 MG capsule Take 2 capsules by mouth at bedtime.   hydrALAZINE  (APRESOLINE ) 50 MG tablet Take 50 mg by mouth 3 (three) times daily.   insulin  glargine (LANTUS ) 100 UNIT/ML injection Inject 10 Units into the skin daily.   KLOR-CON M20 20 MEQ tablet Take 20 mEq by mouth daily.   lidocaine  (LIDODERM ) 5 % Place 1 patch onto the skin every 12 (twelve) hours. Remove & Discard patch within 12 hours or as directed by MD   liraglutide (VICTOZA) 18 MG/3ML SOPN 2.4 mg daily.   losartan (COZAAR) 25 MG tablet Take 25 mg by mouth daily.   mirabegron  ER (MYRBETRIQ ) 25 MG TB24 tablet Take 1 tablet (25 mg total) by mouth daily.   omeprazole (PRILOSEC) 20 MG capsule Take 20 mg by mouth daily.   PROAIR  HFA 108 (90 BASE) MCG/ACT inhaler Inhale 2 puffs into the lungs every 8 (eight) hours.   risedronate (  ACTONEL) 35 MG tablet Take by mouth.   SPIRIVA HANDIHALER 18 MCG inhalation capsule 1 capsule daily.   telmisartan (MICARDIS) 40 MG tablet Take 40 mg by mouth daily.   [DISCONTINUED] atorvastatin  (LIPITOR) 40 MG tablet Take 40 mg by mouth daily.   No facility-administered encounter medications on file as of 06/30/2023.    Surgical History: Past Surgical History:  Procedure Laterality Date   COLONOSCOPY  02/16/13   COLONOSCOPY WITH PROPOFOL  N/A 09/14/2018   Procedure: COLONOSCOPY WITH PROPOFOL ;  Surgeon: Selena Daily, MD;  Location: [facility] ENDOSCOPY;  Service: Gastroenterology;   Laterality: N/A;   COLONOSCOPY WITH PROPOFOL  N/A 09/15/2018   Procedure: COLONOSCOPY WITH PROPOFOL ;  Surgeon: Selena Daily, MD;  Location: [facility] ENDOSCOPY;  Service: Gastroenterology;  Laterality: N/A;   elbow     pinch nerve   HAND SURGERY Bilateral 1610,9604   carpel tunnel   HIP SURGERY Right 1999   MULTIPLE TOOTH EXTRACTIONS  2014   TUBAL LIGATION  1985    Medical History: Past Medical History:  Diagnosis Date   Acid reflux    Anxiety    Arrhythmia    BP (high blood pressure) 01/25/2013   [facility] (congestive cardiac failure) (HCC) 08/04/2013   CHF (congestive heart failure) (HCC)    Chronic kidney disease    Constipation    Cystocele, grade 2    Diabetes mellitus type I (HCC)    Diabetes mellitus without complication (HCC)    Dyspnea    Female incontinence    Hypertension    Recurrent UTI    Stroke (HCC) 2001    Family History: Non contributory to the present illness  Social History: Social History   Socioeconomic History   Marital status: Married    Spouse name: Not on file   Number of children: Not on file   Years of education: Not on file   Highest education level: Not on file  Occupational History   Occupation: Retired  Tobacco Use   Smoking status: Former    Current packs/day: 0.00    Average packs/day: 0.5 packs/day for 20.0 years (10.0 ttl pk-yrs)    Types: Cigarettes    Start date: 09/15/1995    Quit date: 09/15/2015    Years since quitting: 7.7   Smokeless tobacco: Current    Types: Snuff  Vaping Use   Vaping status: Never Used  Substance and Sexual Activity   Alcohol use: No    Alcohol/week: 0.0 standard drinks of alcohol   Drug use: No   Sexual activity: Never    Birth control/protection: None  Other Topics Concern   Not on file  Social History Narrative   Not on file   Social Drivers of Health   Financial Resource Strain: Not on file  Food Insecurity: Not on file  Transportation Needs: Not on file  Physical Activity: Not on file   Stress: Not on file  Social Connections: Not on file  Intimate Partner Violence: Not on file    Vital Signs: Blood pressure 137/76, pulse (!) 48, resp. rate 16, height 5\' 1"  (1.549 m), weight 220 lb (99.8 kg), SpO2 98%. Body mass index is 41.57 kg/m.    Examination: General Appearance: The patient is well-developed, well-nourished, and in no distress. Neck Circumference: 39 cm Skin: Gross inspection of skin unremarkable. Head: normocephalic, no gross deformities. Eyes: no gross deformities noted. ENT: ears appear grossly normal Neurologic: Alert and oriented. No involuntary movements.  STOP BANG RISK ASSESSMENT S (snore) Have you been told  that you snore?     NO   T (tired) Are you often tired, fatigued, or sleepy during the day?   NO  O (obstruction) Do you stop breathing, choke, or gasp during sleep? NO   P (pressure) Do you have or are you being treated for high blood pressure? YES   B (BMI) Is your body index greater than 35 kg/m? YES   A (age) Are you 44 years old or older? YES   N (neck) Do you have a neck circumference greater than 16 inches?   NO   G (gender) Are you a female? NO   TOTAL STOP/BANG "YES" ANSWERS 3       A STOP-Bang score of 2 or less is considered low risk, and a score of 5 or more is high risk for having either moderate or severe OSA. For people who score 3 or 4, doctors may need to perform further assessment to determine how likely they are to have OSA.         EPWORTH SLEEPINESS SCALE:  Scale:  (0)= no chance of dozing; (1)= slight chance of dozing; (2)= moderate chance of dozing; (3)= high chance of dozing  Chance  Situtation    Sitting and reading: 0    Watching TV: 2    Sitting Inactive in public: 0    As a passenger in car: 0      Lying down to rest: 3    Sitting and talking: 0    Sitting quielty after lunch: 1    In a car, stopped in traffic: 0   TOTAL SCORE:   6 out of 24    SLEEP STUDIES:  PSG (07/23/18) AHI  96.7, min SPO2 82%  Titration (07/2018) recommend ASV titration Titration (08/2018) BiPAP ASV@ EPAP min 8 max 15, PS min 4 max 15, MaxPress 25 cmH2O, RR auto   CPAP COMPLIANCE DATA:  Date Range: Unable to print out compliance data, however, data is displayed on screen of [employer] CPAP.   Average Daily Use: 8 hours 2 minutes  Median Use: 8 hours 2 minutes  Compliance for > 4 Hours: 97%  AHI: 4.3 respiratory events per hour  Days Used: 87/90 days  Mask Leak: 7.1  95th Percentile Pressure: 10         LABS: No results found for this or any previous visit (from the past 2160 hours).  Radiology: CT ABDOMEN PELVIS WO CONTRAST Result Date: 10/18/2022 CLINICAL DATA:  Left lower quadrant pain EXAM: CT ABDOMEN AND PELVIS WITHOUT CONTRAST TECHNIQUE: Multidetector CT imaging of the abdomen and pelvis was performed following the standard protocol without IV contrast. RADIATION DOSE REDUCTION: This exam was performed according to the departmental dose-optimization program which includes automated exposure control, adjustment of the mA and/or kV according to patient size and/or use of iterative reconstruction technique. COMPARISON:  None Available. FINDINGS: Lower chest: Lung bases demonstrate no acute airspace disease. Cardiomegaly. Hepatobiliary: No focal liver abnormality is seen. No gallstones, gallbladder wall thickening, or biliary dilatation. Pancreas: Unremarkable. No pancreatic ductal dilatation or surrounding inflammatory changes. Spleen: Normal in size without focal abnormality. Adrenals/Urinary Tract: Adrenal glands are within normal limits. Kidneys show no hydronephrosis. The bladder is unremarkable Stomach/Bowel: Stomach is within normal limits. No evidence of bowel wall thickening, distention, or inflammatory changes. Vascular/Lymphatic: Advanced aortic atherosclerosis. No aneurysm. No suspicious lymph nodes Reproductive: Uterus and bilateral adnexa are unremarkable. Other:  No abdominal wall hernia or abnormality. No abdominopelvic ascites. Small fat containing  umbilical hernia Musculoskeletal: No acute or suspicious osseous abnormality. 9 mm subcutaneous lesion superficial to the distal sacrum of uncertain significance. IMPRESSION: 1. No CT evidence for acute intra-abdominal or pelvic abnormality. 2. Cardiomegaly. 3. Aortic atherosclerosis. Aortic Atherosclerosis (ICD10-I70.0). Electronically Signed   By: Esmeralda Hedge M.D.   On: 10/18/2022 19:04    No results found.  No results found.    Assessment and Plan: Patient Active Problem List   Diagnosis Date Noted   Chronic diastolic CHF (congestive heart failure) (HCC) 10/18/2022   OSA treated with BiPAP 05/07/2021   Encounter for BiPAP use counseling 05/07/2021   Benign essential HTN 04/04/2017   Hyperlipidemia, mixed 04/04/2017   Bradycardia 03/19/2017   Headache disorder 08/19/2016   Angioedema 07/26/2016   Urinary incontinence, mixed 07/06/2015   Obesity, Class III, BMI 40-49.9 (morbid obesity) (HCC) 01/19/2015   Morbid obesity (HCC) 12/07/2014   Recurrent UTI 10/29/2014   Extreme obesity 08/09/2014   Cystocele, grade 2 08/09/2014   Atrophic vaginitis 08/09/2014   Incontinence 08/09/2014   Compulsive tobacco user syndrome 05/09/2014   Current tobacco use 05/09/2014   Hx of adenomatous colonic polyps 02/21/2014   CCF (congestive cardiac failure) (HCC) 08/04/2013   CHF (congestive heart failure) (HCC) 08/04/2013   Chronic kidney disease (CKD), stage III (moderate) (HCC) 01/25/2013   Diabetes (HCC) 01/25/2013   BP (high blood pressure) 01/25/2013   Angiopathy, peripheral (HCC) 01/25/2013   Peripheral arterial occlusive disease (HCC) 01/25/2013   Encounter for screening colonoscopy 12/23/2012   Rectal bleeding 12/23/2012   1. OSA treated with BiPAP (Primary) The patient does tolerate PAP and reports  benefit from PAP use. Somehow she was given a CPAP machine set to 10cm, and not the BIPAP asv that  was ordered. We will reach out to her DME supplier and find out where this stands.  The patient was reminded how to clean equipment and advised to replace supplies routinely. The patient was also counselled on weight loss. The compliance is excellent. The AHI is 4.3.   OSA treated with bipap- controlled, will reach out to her DME provider so she gets the equipment she needs. F/u one year.   2. Encounter for BiPAP use counseling PAP Counseling: had a lengthy discussion with the patient regarding the importance of PAP therapy in management of the sleep apnea. Patient appears to understand the risk factor reduction and also understands the risks associated with untreated sleep apnea. Patient will try to make a good faith effort to remain compliant with therapy. Also instructed the patient on proper cleaning of the device including the water must be changed daily if possible and use of distilled water is preferred. Patient understands that the machine should be regularly cleaned with appropriate recommended cleaning solutions that do not damage the PAP machine for example given white vinegar and water rinses. Other methods such as ozone treatment may not be as good as these simple methods to achieve cleaning.   3. Hypertension, unspecified type Controlled with hydralazine , lasix , clonidine , amlodipine , telmisartan. Continue.     The patient does tolerate PAP and reports  benefit from PAP use. Somehow she was given a CPAP machine set to 10cm, and not the BIPAP asv that was ordered. We will reach out to her DME supplier and find out where this stands.  The patient was reminded how to clean equipment and advised to replace supplies routinely. The patient was also counselled on weight loss. The compliance is excellent. The AHI is 4.3.   OSA  treated with bipap- controlled, will reach out to her DME provider so she gets the equipment she needs. F/u one year.   General Counseling: I have discussed the findings  of the evaluation and examination with [facility].  I have also discussed any further diagnostic evaluation thatmay be needed or ordered today. Shellye verbalizes understanding of the findings of todays visit. We also reviewed her medications today and discussed drug interactions and side effects including but not limited excessive drowsiness and altered mental states. We also discussed that there is always a risk not just to her but also people around her. she has been encouraged to call the office with any questions or concerns that should arise related to todays visit.  No orders of the defined types were placed in this encounter.       I have personally obtained a history, examined the patient, evaluated laboratory and imaging results, formulated the assessment and plan and placed orders. This patient was seen today by Louann Rous, PA-C in collaboration with Dr. Cam Cava.   Cordie Deters, MD [facility] Diplomate ABMS Pulmonary Critical Care Medicine and Sleep Medicine

## 2023-06-30 ENCOUNTER — Ambulatory Visit (INDEPENDENT_AMBULATORY_CARE_PROVIDER_SITE_OTHER): Admitting: Internal Medicine

## 2023-06-30 VITALS — BP 137/76 | HR 48 | Resp 16 | Ht 61.0 in | Wt 220.0 lb

## 2023-06-30 DIAGNOSIS — Z7189 Other specified counseling: Secondary | ICD-10-CM | POA: Diagnosis not present

## 2023-06-30 DIAGNOSIS — G4733 Obstructive sleep apnea (adult) (pediatric): Secondary | ICD-10-CM

## 2023-06-30 DIAGNOSIS — I10 Essential (primary) hypertension: Secondary | ICD-10-CM

## 2023-06-30 NOTE — Patient Instructions (Signed)

## 2023-08-30 ENCOUNTER — Emergency Department

## 2023-08-30 ENCOUNTER — Emergency Department
Admission: EM | Admit: 2023-08-30 | Discharge: 2023-08-30 | Disposition: A | Discharge status: Home / Self Care | Attending: Emergency Medicine | Admitting: Emergency Medicine

## 2023-08-30 ENCOUNTER — Other Ambulatory Visit: Payer: Self-pay

## 2023-08-30 DIAGNOSIS — W540XXA Bitten by dog, initial encounter: Secondary | ICD-10-CM | POA: Insufficient documentation

## 2023-08-30 DIAGNOSIS — S61230A Puncture wound without foreign body of right index finger without damage to nail, initial encounter: Secondary | ICD-10-CM | POA: Diagnosis present

## 2023-08-30 DIAGNOSIS — Z7982 Long term (current) use of aspirin: Secondary | ICD-10-CM | POA: Diagnosis not present

## 2023-08-30 DIAGNOSIS — Z794 Long term (current) use of insulin: Secondary | ICD-10-CM | POA: Insufficient documentation

## 2023-08-30 DIAGNOSIS — S61239A Puncture wound without foreign body of unspecified finger without damage to nail, initial encounter: Secondary | ICD-10-CM

## 2023-08-30 MED ORDER — AMOXICILLIN-POT CLAVULANATE 875-125 MG PO TABS: 1.0000 | ORAL_TABLET | Freq: Two times a day (BID) | ORAL | 0 refills | Status: DC

## 2023-08-30 MED ORDER — AMOXICILLIN-POT CLAVULANATE 875-125 MG PO TABS: 1.0000 | ORAL_TABLET | Freq: Two times a day (BID) | ORAL | 0 refills | Status: AC

## 2023-08-30 MED ORDER — AMOXICILLIN-POT CLAVULANATE 875-125 MG PO TABS
1.0000 | ORAL_TABLET | Freq: Once | ORAL | Status: AC
  Administered 2023-08-30: 1 via ORAL
  Filled 2023-08-30: qty 1

## 2023-08-30 NOTE — Discharge Instructions (Signed)
 Please soak right index finger and half Water half hydroperoxide 5 minutes a day over the next 5 days.  Keep wound clean covered with a Band-Aid.  Take antibiotic as prescribed for 1 week.  Return to the ER for any warmth redness swelling or drainage.

## 2023-08-30 NOTE — ED Notes (Signed)
 Fall risk downgraded to low due to patient able to walk without assistance and no complaints when walking

## 2023-08-30 NOTE — ED Triage Notes (Signed)
 Pt to ed from home via [facility] for dog bite. Pt has puncture wounds to right index finger. Pt is caox4, in no acute distress was ambulatory on triage. Unknown vaccine status of dog as it is not her dog. [facility] on scene and already filed the report and animal control already contacted.  160/80

## 2023-08-30 NOTE — ED Provider Notes (Signed)
 [place] EMERGENCY DEPARTMENT AT [facility] Provider Note   CSN: [id number] Arrival date & time: 08/30/23  1911     Patient presents with: Animal Bite   Kathryn PFAHLER is a 73 y.o. female.  Presents to the emergency department valuation of a dog bite.  Patient was at home earlier today just prior to arrival when her dog bit her in the right index finger.  Patient just received the dog from her sister.  Vaccine status unknown but animal control has been notified.  Patient is in possession of the dog.  This is inside all this been acting normal.  Patient states her tetanus is up-to-date.  She has a small volar and dorsal puncture wound along the distal phalanx of the right index finger.  Pain is mild.      Prior to Admission medications   Medication Sig Start Date End Date Taking? Authorizing Provider  amLODipine  (NORVASC ) 10 MG tablet Take 10 mg by mouth daily. 03/05/22   [provider]  amoxicillin -clavulanate (AUGMENTIN ) 875-125 MG tablet Take 1 tablet by mouth 2 (two) times daily for 7 days. 08/30/23 09/06/23  Charlene Debby BROCKS, PA-C  aspirin  81 MG tablet Take 81 mg by mouth daily.    [provider]  atorvastatin  (LIPITOR) 80 MG tablet Take 1 tablet by mouth daily. 05/22/23   [provider]  baclofen  (LIORESAL ) 10 MG tablet Take 1 tablet by mouth 3 (three) times daily. 12/18/12   [provider]  Calcium  Carbonate-Vitamin D (CALCIUM  + D PO) Take 2 tablets by mouth daily.    [provider]  citalopram  (CELEXA ) 40 MG tablet Take 1 tablet (40 mg total) by mouth daily. 07/12/15   Mohammed Goldstein, MD  cloNIDine  (CATAPRES ) 0.2 MG tablet Take 1 tablet by mouth 2 (two) times daily. 06/20/16   [provider]  conjugated estrogens  (PREMARIN ) vaginal cream Place 1 applicator vaginally daily. 08/10/19   [provider]  fluticasone (FLONASE) 50 MCG/ACT nasal spray Place into both nostrils as needed. 04/17/20   [provider]   Fluticasone-Salmeterol (ADVAIR) 250-50 MCG/DOSE AEPB Inhale 1 puff into the lungs 2 (two) times daily.    [provider]  furosemide  (LASIX ) 20 MG tablet Take 1 tablet by mouth 2 (two) times daily. 12/17/12   [provider]  gabapentin (NEURONTIN) 100 MG capsule Take 2 capsules by mouth at bedtime. 01/13/17   [provider]  hydrALAZINE  (APRESOLINE ) 50 MG tablet Take 50 mg by mouth 3 (three) times daily.    [provider]  insulin  glargine (LANTUS ) 100 UNIT/ML injection Inject 10 Units into the skin daily.    [provider]  KLOR-CON M20 20 MEQ tablet Take 20 mEq by mouth daily. 03/05/22   [provider]  lidocaine  (LIDODERM ) 5 % Place 1 patch onto the skin every 12 (twelve) hours. Remove & Discard patch within 12 hours or as directed by MD 10/18/22 10/18/23  Willo Dunnings, MD  liraglutide (VICTOZA) 18 MG/3ML SOPN 2.4 mg daily. 05/27/19   [provider]  losartan (COZAAR) 25 MG tablet Take 25 mg by mouth daily. 08/09/19   [provider]  mirabegron  ER (MYRBETRIQ ) 25 MG TB24 tablet Take 1 tablet (25 mg total) by mouth daily. 07/03/15   Helon Kirsch A, PA-C  omeprazole (PRILOSEC) 20 MG capsule Take 20 mg by mouth daily. 03/06/22   [provider]  OZEMPIC, 1 MG/DOSE, 4 MG/3ML [facility]  11/25/22   [provider]  PROAIR   HFA 108 (90 BASE) MCG/ACT inhaler Inhale 2 puffs into the lungs every 8 (eight) hours. 10/22/12   [provider]  risedronate (ACTONEL) 35 MG tablet Take by mouth. 03/06/22   [provider]  SPIRIVA HANDIHALER 18 MCG inhalation capsule 1 capsule daily.    [provider]  telmisartan (MICARDIS) 40 MG tablet Take 40 mg by mouth daily.    [provider]    Allergies: Ace inhibitors and Pravastatin    Review of Systems  Updated Vital Signs BP (!) 165/110   Pulse 78   Temp 98.7 F (37.1 C) (Oral)   Resp 18   Ht 5' 1 (1.549 m)   SpO2 96%   BMI 41.57  kg/m   Physical Exam Constitutional:      Appearance: She is well-developed.  HENT:     Head: Normocephalic and atraumatic.   Eyes:     Conjunctiva/sclera: Conjunctivae normal.    Cardiovascular:     Rate and Rhythm: Normal rate.  Pulmonary:     Effort: Pulmonary effort is normal. No respiratory distress.   Musculoskeletal:        General: Normal range of motion.     Cervical back: Normal range of motion.     Comments: Right index finger with 1 cm nongaping laceration along the distal portion of the middle phalanx along the volar aspect of the right index finger.  There is also a small dorsal puncture wound along the base of the distal phalanx.  Active flexion and extension of the DIP joint is intact   Skin:    General: Skin is warm.     Findings: No rash.   Neurological:     General: No focal deficit present.     Mental Status: She is alert and oriented to person, place, and time.   Psychiatric:        Behavior: Behavior normal.        Thought Content: Thought content normal.     (all labs ordered are listed, but only abnormal results are displayed) Labs Reviewed - No data to display  EKG: None  Radiology: DG Finger Index Right Result Date: 08/30/2023 CLINICAL DATA:  Dog bite, puncture wound. EXAM: RIGHT INDEX FINGER 2+V COMPARISON:  None Available. FINDINGS: There is no evidence of fracture or dislocation. There is no evidence of arthropathy or other focal bone abnormality. Generalized soft tissue edema. Skin irregularity is seen about the volar digit. No radiopaque foreign body. IMPRESSION: Generalized soft tissue edema with skin irregularity about the volar digit. No radiopaque foreign body. No fracture or subluxation. Electronically Signed   By: Andrea Gasman M.D.   On: 08/30/2023 21:23     Procedures   Medications Ordered in the ED  amoxicillin -clavulanate (AUGMENTIN ) 875-125 MG per tablet 1 tablet (1 tablet Oral Given 08/30/23 2034)                                     Medical Decision Making Amount and/or Complexity of Data Reviewed Radiology: ordered.  Risk Prescription drug management.   73 year old female with dog bite to the right index finger.  This was her own dog and animal control notified and animal is being quarantined.  Patient's tetanus is up-to-date.  She started on Augmentin .  X-rays ordered and independently reviewed by me today show no evidence of acute bony abnormality or foreign body to the right index finger.  She  is educated on wound care.  She understands signs symptoms return to the ED for. Final diagnoses:  Dog bite, initial encounter  Puncture wound of finger without foreign body without damage to nail, initial encounter    ED Discharge Orders          Ordered    amoxicillin -clavulanate (AUGMENTIN ) 875-125 MG tablet  2 times daily,   Status:  Discontinued        08/30/23 2113    amoxicillin -clavulanate (AUGMENTIN ) 875-125 MG tablet  2 times daily        08/30/23 2114               Charlene Debby JAYSON DEVONNA 08/30/23 2128    Floy Roberts, MD 08/30/23 2207

## 2023-09-15 ENCOUNTER — Telehealth: Payer: Self-pay

## 2023-09-15 NOTE — Telephone Encounter (Signed)
 P would like to schedule repeat colonoscopy and would not like to have to transfer care to have procedure... Spoke to Ginger, ok to schedule with Dr Jinny and pt is agreeable

## 2023-09-18 NOTE — Telephone Encounter (Signed)
 Called pt, no answer an VM not set up

## 2023-09-22 NOTE — Telephone Encounter (Signed)
 Called pt, no answer an VM not set up

## 2023-09-25 ENCOUNTER — Other Ambulatory Visit: Payer: Self-pay

## 2023-09-25 DIAGNOSIS — Z860100 Personal history of colon polyps, unspecified: Secondary | ICD-10-CM

## 2023-09-25 MED ORDER — PEG 3350-KCL-NABCB-NACL-NASULF 236 G PO SOLR: 4000.0000 mL | Freq: Once | ORAL | 0 refills | Status: AC

## 2023-09-25 NOTE — Telephone Encounter (Signed)
 Gastroenterology Pre-Procedure Review  Request Date: December 15, 2023 Requesting Physician: Dr. Jinny  PATIENT REVIEW QUESTIONS: The patient responded to the following health history questions as indicated:    1. Are you having any GI issues? no 2. Do you have a personal history of Polyps? yes 3. Do you have a family history of Colon Cancer or Polyps? no 4. Diabetes Mellitus? yes 5. Joint replacements in the past 12 months?no 6. Major health problems in the past 3 months?no 7. Any artificial heart valves, MVP, or defibrillator?no    MEDICATIONS & ALLERGIES:    Patient reports the following regarding taking any anticoagulation/antiplatelet therapy:   Plavix, Coumadin, Eliquis, Xarelto, Lovenox , Pradaxa, Brilinta, or Effient? no Aspirin ? yes (81mg )  Patient confirms/reports the following medications:  Current Outpatient Medications  Medication Sig Dispense Refill   JARDIANCE 10 MG TABS tablet Take 10 mg by mouth daily.     polyethylene glycol (GOLYTELY ) 236 g solution Take 4,000 mLs by mouth once for 1 dose. 4000 mL 0   polyethylene glycol (GOLYTELY ) 236 g solution Take 4,000 mLs by mouth once for 1 dose. 4000 mL 0   amLODipine  (NORVASC ) 10 MG tablet Take 10 mg by mouth daily.     aspirin  81 MG tablet Take 81 mg by mouth daily.     atorvastatin  (LIPITOR) 80 MG tablet Take 1 tablet by mouth daily.     baclofen  (LIORESAL ) 10 MG tablet Take 1 tablet by mouth 3 (three) times daily.     Calcium  Carbonate-Vitamin D (CALCIUM  + D PO) Take 2 tablets by mouth daily.     citalopram  (CELEXA ) 40 MG tablet Take 1 tablet (40 mg total) by mouth daily. 30 tablet 0   cloNIDine  (CATAPRES ) 0.2 MG tablet Take 1 tablet by mouth 2 (two) times daily.     conjugated estrogens  (PREMARIN ) vaginal cream Place 1 applicator vaginally daily.     fluticasone (FLONASE) 50 MCG/ACT nasal spray Place into both nostrils as needed.     Fluticasone-Salmeterol (ADVAIR) 250-50 MCG/DOSE AEPB Inhale 1 puff into the lungs 2  (two) times daily.     furosemide  (LASIX ) 20 MG tablet Take 1 tablet by mouth 2 (two) times daily.     gabapentin (NEURONTIN) 100 MG capsule Take 2 capsules by mouth at bedtime.     hydrALAZINE  (APRESOLINE ) 50 MG tablet Take 50 mg by mouth 3 (three) times daily.     insulin  glargine (LANTUS ) 100 UNIT/ML injection Inject 10 Units into the skin daily.     KLOR-CON M20 20 MEQ tablet Take 20 mEq by mouth daily.     lidocaine  (LIDODERM ) 5 % Place 1 patch onto the skin every 12 (twelve) hours. Remove & Discard patch within 12 hours or as directed by MD 10 patch 0   losartan (COZAAR) 25 MG tablet Take 25 mg by mouth daily.     mirabegron  ER (MYRBETRIQ ) 25 MG TB24 tablet Take 1 tablet (25 mg total) by mouth daily. 30 tablet 12   omeprazole (PRILOSEC) 20 MG capsule Take 20 mg by mouth daily.     OZEMPIC, 1 MG/DOSE, 4 MG/3ML SOPN      PROAIR  HFA 108 (90 BASE) MCG/ACT inhaler Inhale 2 puffs into the lungs every 8 (eight) hours.     risedronate (ACTONEL) 35 MG tablet Take by mouth.     SPIRIVA HANDIHALER 18 MCG inhalation capsule 1 capsule daily.     telmisartan (MICARDIS) 40 MG tablet Take 40 mg by mouth daily.  No current facility-administered medications for this visit.    Patient confirms/reports the following allergies:  Allergies  Allergen Reactions   Ace Inhibitors Swelling   Pravastatin Itching    Per patient itching on the bottom of feet    No orders of the defined types were placed in this encounter.   AUTHORIZATION INFORMATION Primary Insurance: [facility] Medicare 1D#: Group #:  Secondary Insurance: Medicaid 1D#: Group #:  SCHEDULE INFORMATION: Date: 12/15/2023 Time: Location: [facility]

## 2023-09-25 NOTE — Addendum Note (Signed)
 Addended by: LANNIE ANDREA GRADE on: 09/25/2023 04:09 PM   Modules accepted: Orders

## 2023-12-08 ENCOUNTER — Encounter: Payer: Self-pay | Admitting: Medical

## 2023-12-08 ENCOUNTER — Ambulatory Visit: Attending: Medical | Admitting: Medical

## 2023-12-08 VITALS — BP 130/66 | HR 71 | Ht 61.0 in | Wt 213.2 lb

## 2023-12-08 DIAGNOSIS — Z01810 Encounter for preprocedural cardiovascular examination: Secondary | ICD-10-CM

## 2023-12-08 DIAGNOSIS — I10 Essential (primary) hypertension: Secondary | ICD-10-CM

## 2023-12-08 DIAGNOSIS — E782 Mixed hyperlipidemia: Secondary | ICD-10-CM

## 2023-12-08 DIAGNOSIS — N183 Chronic kidney disease, stage 3 unspecified: Secondary | ICD-10-CM

## 2023-12-08 NOTE — Addendum Note (Signed)
 Addended by: Myah Guynes D on: 12/08/2023 02:47 PM   Modules accepted: Orders

## 2023-12-08 NOTE — Patient Instructions (Signed)
 Medication Instructions:  Your physician recommends that you continue on your current medications as directed. Please refer to the Current Medication list given to you today.   *If you need a refill on your cardiac medications before your next appointment, please call your pharmacy*  Lab Work: No labs ordered today  If you have labs (blood work) drawn today and your tests are completely normal, you will receive your results only by: MyChart Message (if you have MyChart) OR A paper copy in the mail If you have any lab test that is abnormal or we need to change your treatment, we will call you to review the results.  Testing/Procedures: No test ordered today   Follow-Up: At [facility], you and your health needs are our priority.  As part of our continuing mission to provide you with exceptional heart care, our providers are all part of one team.  This team includes your primary Cardiologist (physician) and Advanced Practice Providers or APPs (Physician Assistants and Nurse Practitioners) who all work together to provide you with the care you need, when you need it.  Your next appointment:   12 month(s)  Provider:   You may see Redell Cave, MD or one of the following Advanced Practice Providers on your designated Care Team:   Lonni Meager, NP Lesley Maffucci, PA-C Bernardino Bring, PA-C Cadence Irondale, PA-C Tylene Lunch, NP Barnie Hila, NP

## 2023-12-08 NOTE — Progress Notes (Signed)
  Cardiology Office Note   Date:  12/08/2023  ID:  Kathryn Kramer, DOB Jul 04, 1950, MRN [id number] PCP: Carmella Denver BROCKS, MD  [facility] Providers Cardiologist:  Redell Cave, MD   History of Present Illness Kathryn Kramer is a 73 y.o. female with a history of HFpEF, hypertension, hyperlipidemia, diabetes, PVCs, CKD, CVA in 2020, OSA on CPAP, former smoker x 8 years who presents for 1 year follow-up.  Echo in 2018 showed LVEF 55-60%, impaired relaxation. Echo 07/2021 showed LVEF 55%, mild MR, moderately dilated left atrium.   Today, the patient is overall doing well from a cardiac perspective. She denies chest pain, SOB, lower leg edema, orthopnea, pnd, lightheadedness, palpitations, heart racing. The patient is needing a routine colonoscopy. She can slowly walk up a flight of stairs and can walk 1-2 blocks. She has hip issues that limit her function.  Studies Reviewed EKG Interpretation Date/Time:  Monday December 08 2023 14:02:46 EDT Ventricular Rate:  71 PR Interval:  188 QRS Duration:  84 QT Interval:  418 QTC Calculation: 454 R Axis:   -15  Text Interpretation: Normal sinus rhythm Inferior infarct , age undetermined Possible Anterior infarct , age undetermined When compared with ECG of 23-Dec-2022 14:52, Premature ventricular complexes are no longer Present Confirmed by Franchester, Demani Mcbrien ([id number]) on 12/08/2023 2:13:40 PM     Physical Exam VS:  BP 130/66 (BP Location: Left Arm, Patient Position: Sitting, Cuff Size: Normal)   Pulse 71   Ht 5' 1 (1.549 m)   Wt 213 lb 3.2 oz (96.7 kg)   SpO2 97%   BMI 40.28 kg/m        Wt Readings from Last 3 Encounters:  12/08/23 213 lb 3.2 oz (96.7 kg)  06/30/23 220 lb (99.8 kg)  12/23/22 213 lb 3.2 oz (96.7 kg)    GEN: Well nourished, well developed in no acute distress NECK: No JVD; No carotid bruits CARDIAC: RRR, no murmurs, rubs, gallops RESPIRATORY:  Clear to auscultation without rales, wheezing or rhonchi  ABDOMEN: Soft,  non-tender, non-distended EXTREMITIES:  No edema; No deformity   ASSESSMENT AND PLAN  Pre-operative cardiac evaluation for colonoscopy Patient is a routine colonoscopy.  Patient is overall stable from a cardiac perspective.  She reports function is limited due to hip pain.  Patient is able to slowly walk up a flight of stairs and 1-2 blocks with stopping.  Patient denies chest pain.  She is euvolemic on exam.  We do have confusion with her medications and we will call her pharmacy to confirm medication list.  EKG is stable.  METs greater than 4. RCRI = 10% risk of MACE  HTN BP is normal. She reports she is not taking clonidine , lasix  or hydralazine . She has losartan 25mg  daily and Telmisartan 40mg  daily- Pharmacy confirmed Telmisartan 40mg  daily.  Continue amlodipine  5mg  daily.  HLD Continue Lipitor 80mg  daily.   CKD stage 3 Scr 1.75, BUN 18. She follows with nephrology.        Dispo: follow-up in 1 year  Signed, Ica Daye VEAR Franchester, PA-C

## 2023-12-15 ENCOUNTER — Ambulatory Visit: Admitting: Anesthesiology

## 2023-12-15 ENCOUNTER — Encounter
Admission: RE | Disposition: A | Discharge status: Home / Self Care | Payer: Self-pay | Source: Home / Self Care | Attending: Gastroenterology

## 2023-12-15 ENCOUNTER — Telehealth: Payer: Self-pay

## 2023-12-15 ENCOUNTER — Ambulatory Visit
Admission: RE | Admit: 2023-12-15 | Discharge: 2023-12-15 | Disposition: A | Discharge status: Home / Self Care | Attending: Gastroenterology | Admitting: Gastroenterology

## 2023-12-15 ENCOUNTER — Encounter: Payer: Self-pay | Admitting: Gastroenterology

## 2023-12-15 DIAGNOSIS — E1051 Type 1 diabetes mellitus with diabetic peripheral angiopathy without gangrene: Secondary | ICD-10-CM | POA: Diagnosis not present

## 2023-12-15 DIAGNOSIS — K5730 Diverticulosis of large intestine without perforation or abscess without bleeding: Secondary | ICD-10-CM | POA: Diagnosis not present

## 2023-12-15 DIAGNOSIS — Z7985 Long-term (current) use of injectable non-insulin antidiabetic drugs: Secondary | ICD-10-CM | POA: Diagnosis not present

## 2023-12-15 DIAGNOSIS — K219 Gastro-esophageal reflux disease without esophagitis: Secondary | ICD-10-CM | POA: Insufficient documentation

## 2023-12-15 DIAGNOSIS — D125 Benign neoplasm of sigmoid colon: Secondary | ICD-10-CM | POA: Insufficient documentation

## 2023-12-15 DIAGNOSIS — Z6841 Body Mass Index (BMI) 40.0 and over, adult: Secondary | ICD-10-CM | POA: Insufficient documentation

## 2023-12-15 DIAGNOSIS — Z8673 Personal history of transient ischemic attack (TIA), and cerebral infarction without residual deficits: Secondary | ICD-10-CM | POA: Insufficient documentation

## 2023-12-15 DIAGNOSIS — N189 Chronic kidney disease, unspecified: Secondary | ICD-10-CM | POA: Insufficient documentation

## 2023-12-15 DIAGNOSIS — K5140 Inflammatory polyps of colon without complications: Secondary | ICD-10-CM | POA: Diagnosis not present

## 2023-12-15 DIAGNOSIS — G4730 Sleep apnea, unspecified: Secondary | ICD-10-CM | POA: Diagnosis not present

## 2023-12-15 DIAGNOSIS — I509 Heart failure, unspecified: Secondary | ICD-10-CM | POA: Insufficient documentation

## 2023-12-15 DIAGNOSIS — F419 Anxiety disorder, unspecified: Secondary | ICD-10-CM | POA: Insufficient documentation

## 2023-12-15 DIAGNOSIS — Z1211 Encounter for screening for malignant neoplasm of colon: Secondary | ICD-10-CM | POA: Diagnosis not present

## 2023-12-15 DIAGNOSIS — I130 Hypertensive heart and chronic kidney disease with heart failure and stage 1 through stage 4 chronic kidney disease, or unspecified chronic kidney disease: Secondary | ICD-10-CM | POA: Insufficient documentation

## 2023-12-15 DIAGNOSIS — E66813 Obesity, class 3: Secondary | ICD-10-CM | POA: Insufficient documentation

## 2023-12-15 DIAGNOSIS — Z860101 Personal history of adenomatous and serrated colon polyps: Secondary | ICD-10-CM

## 2023-12-15 DIAGNOSIS — K635 Polyp of colon: Secondary | ICD-10-CM

## 2023-12-15 DIAGNOSIS — Z79899 Other long term (current) drug therapy: Secondary | ICD-10-CM | POA: Diagnosis not present

## 2023-12-15 DIAGNOSIS — Z794 Long term (current) use of insulin: Secondary | ICD-10-CM | POA: Insufficient documentation

## 2023-12-15 DIAGNOSIS — Z860100 Personal history of colon polyps, unspecified: Secondary | ICD-10-CM

## 2023-12-15 DIAGNOSIS — Z87891 Personal history of nicotine dependence: Secondary | ICD-10-CM | POA: Insufficient documentation

## 2023-12-15 DIAGNOSIS — E1022 Type 1 diabetes mellitus with diabetic chronic kidney disease: Secondary | ICD-10-CM | POA: Insufficient documentation

## 2023-12-15 HISTORY — PX: COLONOSCOPY: SHX5424

## 2023-12-15 HISTORY — PX: POLYPECTOMY: SHX149

## 2023-12-15 LAB — GLUCOSE, CAPILLARY: Glucose-Capillary: 91 mg/dL (ref 70–99)

## 2023-12-15 SURGERY — COLONOSCOPY
Anesthesia: General

## 2023-12-15 MED ORDER — PROPOFOL 500 MG/50ML IV EMUL
INTRAVENOUS | Status: DC | PRN
  Administered 2023-12-15: 150 ug/kg/min via INTRAVENOUS

## 2023-12-15 MED ORDER — PROPOFOL 1000 MG/100ML IV EMUL
INTRAVENOUS | Status: AC
  Filled 2023-12-15: qty 100

## 2023-12-15 MED ORDER — SODIUM CHLORIDE 0.9 % IV SOLN
INTRAVENOUS | Status: DC
  Administered 2023-12-15: 20 mL/h via INTRAVENOUS

## 2023-12-15 MED ORDER — PROPOFOL 10 MG/ML IV BOLUS
INTRAVENOUS | Status: DC | PRN
  Administered 2023-12-15: 100 mg via INTRAVENOUS

## 2023-12-15 NOTE — Anesthesia Postprocedure Evaluation (Signed)
 Anesthesia Post Note  Patient: Kathryn Kramer  Procedure(s) Performed: COLONOSCOPY POLYPECTOMY, INTESTINE  Patient location during evaluation: Endoscopy Anesthesia Type: General Level of consciousness: awake and alert Pain management: pain level controlled Vital Signs Assessment: post-procedure vital signs reviewed and stable Respiratory status: spontaneous breathing, nonlabored ventilation, respiratory function stable and patient connected to nasal cannula oxygen Cardiovascular status: blood pressure returned to baseline and stable Postop Assessment: no apparent nausea or vomiting Anesthetic complications: no   There were no known notable events for this encounter.   Last Vitals:  Vitals:   12/15/23 0843 12/15/23 0853  BP: 113/65 (!) 158/72  Pulse: 83 83  Resp: 20 14  Temp:    SpO2: 98% 98%    Last Pain:  Vitals:   12/15/23 0853  TempSrc:   PainSc: 0-No pain                 [name]

## 2023-12-15 NOTE — Anesthesia Preprocedure Evaluation (Addendum)
 Anesthesia Evaluation  Patient identified by MRN, date of birth, ID band Patient awake    Reviewed: Allergy & Precautions, NPO status , Patient's Chart, lab work & pertinent test results  History of Anesthesia Complications Negative for: history of anesthetic complications  Airway Mallampati: III  TM Distance: >3 FB Neck ROM: full    Dental  (+) Edentulous Upper, Edentulous Lower   Pulmonary shortness of breath and with exertion, sleep apnea and Continuous Positive Airway Pressure Ventilation , former smoker   Pulmonary exam normal        Cardiovascular hypertension, On Medications + Peripheral Vascular Disease and +CHF (echo reviewed)  Normal cardiovascular exam+ dysrhythmias (PVCs)      Neuro/Psych  Headaches PSYCHIATRIC DISORDERS Anxiety     CVA    GI/Hepatic Neg liver ROS,GERD  ,,  Endo/Other  diabetes  Class 3 obesity  Renal/GU CRFRenal disease  negative genitourinary   Musculoskeletal   Abdominal   Peds  Hematology negative hematology ROS (+)   Anesthesia Other Findings Past Medical History: No date: Acid reflux No date: Anxiety No date: Arrhythmia 01/25/2013: BP (high blood pressure) 08/04/2013: CCF (congestive cardiac failure) (HCC) No date: CHF (congestive heart failure) (HCC) No date: Chronic kidney disease No date: Constipation No date: Cystocele, grade 2 No date: Diabetes mellitus type I (HCC) No date: Diabetes mellitus without complication (HCC) No date: Dyspnea No date: Female incontinence No date: Hypertension No date: Recurrent UTI 2001: Stroke [facility])  Past Surgical History: 02/16/13: COLONOSCOPY 09/14/2018: COLONOSCOPY WITH PROPOFOL ; N/A     Comment:  Procedure: COLONOSCOPY WITH PROPOFOL ;  Surgeon: Unk Corinn Skiff, MD;  Location: [facility] ENDOSCOPY;  Service:               Gastroenterology;  Laterality: N/A; 09/15/2018: COLONOSCOPY WITH PROPOFOL ; N/A     Comment:  Procedure:  COLONOSCOPY WITH PROPOFOL ;  Surgeon: Unk Corinn Skiff, MD;  Location: [facility] ENDOSCOPY;  Service:               Gastroenterology;  Laterality: N/A; No date: elbow     Comment:  pinch nerve 8004,8002: HAND SURGERY; Bilateral     Comment:  carpel tunnel 1999: HIP SURGERY; Right 2014: MULTIPLE TOOTH EXTRACTIONS 1985: TUBAL LIGATION  BMI    Body Mass Index: 40.02 kg/m      Reproductive/Obstetrics negative OB ROS                              Anesthesia Physical Anesthesia Plan  ASA: 3  Anesthesia Plan: General   Post-op Pain Management: Minimal or no pain anticipated   Induction: Intravenous  PONV Risk Score and Plan: 2 and Propofol  infusion and TIVA  Airway Management Planned: Natural Airway and Nasal Cannula  Additional Equipment:   Intra-op Plan:   Post-operative Plan:   Informed Consent: I have reviewed the patients History and Physical, chart, labs and discussed the procedure including the risks, benefits and alternatives for the proposed anesthesia with the patient or authorized representative who has indicated his/her understanding and acceptance.     Dental Advisory Given  Plan Discussed with: Anesthesiologist, CRNA and Surgeon  Anesthesia Plan Comments: (Patient consented for risks of anesthesia including but not limited to:  - adverse reactions to medications - risk of airway placement if required - damage  to eyes, teeth, lips or other oral mucosa - nerve damage due to positioning  - sore throat or hoarseness - Damage to heart, brain, nerves, lungs, other parts of body or loss of life  Patient voiced understanding and assent.)         Anesthesia Quick Evaluation

## 2023-12-15 NOTE — H&P (Signed)
 Rogelia Copping, MD [facility] [address], [address] [place], [place] [address] Phone:[phone] Fax : [phone]  Primary Care Physician:  Carmella Denver BROCKS, MD Primary Gastroenterologist:  Dr. Copping  Pre-Procedure History & Physical: HPI:  Kathryn Kramer is a 73 y.o. female is here for an colonoscopy.   Past Medical History:  Diagnosis Date   Acid reflux    Anxiety    Arrhythmia    BP (high blood pressure) 01/25/2013   CCF (congestive cardiac failure) (HCC) 08/04/2013   CHF (congestive heart failure) (HCC)    Chronic kidney disease    Constipation    Cystocele, grade 2    Diabetes mellitus type I (HCC)    Diabetes mellitus without complication (HCC)    Dyspnea    Female incontinence    Hypertension    Recurrent UTI    Stroke [facility]) 2001    Past Surgical History:  Procedure Laterality Date   COLONOSCOPY  02/16/13   COLONOSCOPY WITH PROPOFOL  N/A 09/14/2018   Procedure: COLONOSCOPY WITH PROPOFOL ;  Surgeon: Unk Corinn Skiff, MD;  Location: [facility] ENDOSCOPY;  Service: Gastroenterology;  Laterality: N/A;   COLONOSCOPY WITH PROPOFOL  N/A 09/15/2018   Procedure: COLONOSCOPY WITH PROPOFOL ;  Surgeon: Unk Corinn Skiff, MD;  Location: [facility] ENDOSCOPY;  Service: Gastroenterology;  Laterality: N/A;   elbow     pinch nerve   HAND SURGERY Bilateral 8004,8002   carpel tunnel   HIP SURGERY Right 1999   MULTIPLE TOOTH EXTRACTIONS  2014   TUBAL LIGATION  1985    Prior to Admission medications   Medication Sig Start Date End Date Taking? Authorizing Provider  amLODipine  (NORVASC ) 10 MG tablet Take 10 mg by mouth daily. 03/05/22  Yes [provider]  aspirin  81 MG tablet Take 81 mg by mouth daily.   Yes [provider]  atorvastatin  (LIPITOR) 80 MG tablet Take 1 tablet by mouth daily. 05/22/23  Yes [provider]  baclofen  (LIORESAL ) 10 MG tablet Take 1 tablet by mouth 3 (three) times daily. 12/18/12  Yes [provider]  Calcium  Carbonate-Vitamin D (CALCIUM   + D PO) Take 2 tablets by mouth daily.   Yes [provider]  cephALEXin  (KEFLEX ) 250 MG capsule Take 1 capsule by mouth daily.   Yes [provider]  citalopram  (CELEXA ) 40 MG tablet Take 1 tablet (40 mg total) by mouth daily. 07/12/15  Yes Mohammed Goldstein, MD  conjugated estrogens  (PREMARIN ) vaginal cream Place 1 applicator vaginally daily. 08/10/19  Yes [provider]  fluticasone (FLONASE) 50 MCG/ACT nasal spray Place into both nostrils as needed. 04/17/20  Yes [provider]  Fluticasone-Salmeterol (ADVAIR) 250-50 MCG/DOSE AEPB Inhale 1 puff into the lungs 2 (two) times daily.   Yes [provider]  gabapentin (NEURONTIN) 100 MG capsule Take 2 capsules by mouth at bedtime. 01/13/17  Yes [provider]  insulin  glargine (LANTUS ) 100 UNIT/ML injection Inject 10 Units into the skin daily.   Yes [provider]  JARDIANCE 10 MG TABS tablet Take 10 mg by mouth daily. 09/03/23  Yes [provider]  KLOR-CON M20 20 MEQ tablet Take 20 mEq by mouth daily. 03/05/22  Yes [provider]  mirabegron  ER (MYRBETRIQ ) 25 MG TB24 tablet Take 1 tablet (25 mg total) by mouth daily. 07/03/15  Yes McGowan, Clotilda A, PA-C  omeprazole (PRILOSEC) 20 MG capsule Take 20 mg by mouth daily. 03/06/22  Yes [provider]  OZEMPIC, 1 MG/DOSE, 4 MG/3ML SOPN  11/25/22  Yes [provider]  PROAIR  HFA 108 (90 BASE) MCG/ACT inhaler Inhale 2 puffs into the lungs every 8 (eight) hours. 10/22/12  Yes [provider]  risedronate (ACTONEL) 35 MG tablet Take by mouth. 03/06/22  Yes [provider]  SPIRIVA HANDIHALER 18 MCG inhalation capsule 1 capsule daily.   Yes [provider]  telmisartan (MICARDIS) 40 MG tablet Take 40 mg by mouth daily.   Yes [provider]    Allergies as of 09/25/2023 - Review Complete 08/30/2023  Allergen Reaction Noted   Ace inhibitors Swelling 07/26/2016   Pravastatin Itching  12/23/2012    Family History  Problem Relation Age of Onset   Diabetes Mother    Alcohol abuse Mother    Colon cancer Father    Heart failure Sister    Breast cancer Sister    Colon polyps Brother    Colon cancer Brother    Alcohol abuse Brother    Drug abuse Brother    Alcohol abuse Brother    Drug abuse Brother    Alcohol abuse Brother    Drug abuse Brother    Alcohol abuse Brother    Drug abuse Brother    Alcohol abuse Brother    Drug abuse Brother    Alcohol abuse Brother    Drug abuse Brother    Colon polyps Daughter    Diabetes Daughter    Kidney disease Neg Hx    Bladder Cancer Neg Hx    Ovarian cancer Neg Hx     Social History   Socioeconomic History   Marital status: Married    Spouse name: Not on file   Number of children: Not on file   Years of education: Not on file   Highest education level: Not on file  Occupational History   Occupation: Retired  Tobacco Use   Smoking status: Former    Current packs/day: 0.00    Average packs/day: 0.5 packs/day for 20.0 years (10.0 ttl pk-yrs)    Types: Cigarettes    Start date: 09/15/1995    Quit date: 09/15/2015    Years since quitting: 8.2   Smokeless tobacco: Current    Types: Snuff  Vaping Use   Vaping status: Never Used  Substance and Sexual Activity   Alcohol use: No    Alcohol/week: 0.0 standard drinks of alcohol   Drug use: No   Sexual activity: Never    Birth control/protection: None  Other Topics Concern   Not on file  Social History Narrative   Not on file   Social Drivers of Health   Financial Resource Strain: Not on file  Food Insecurity: Not on file  Transportation Needs: Not on file  Physical Activity: Not on file  Stress: Not on file  Social Connections: Not on file  Intimate Partner Violence: Not on file    Review of Systems: See HPI, otherwise negative ROS  Physical Exam: BP (!) 161/101   Pulse (!) 112   Temp (!) 96.9 F (36.1 C) (Temporal)   Resp 20   Ht 5' 1 (1.549  m)   Wt 96.1 kg   SpO2 100%   BMI 40.02 kg/m  General:   Alert,  pleasant and cooperative in NAD Head:  Normocephalic and atraumatic. Neck:  Supple; no masses or thyromegaly. Lungs:  Clear throughout to auscultation.    Heart:  Regular rate and rhythm. Abdomen:  Soft, nontender and nondistended. Normal bowel sounds, without guarding, and without rebound.   Neurologic:  Alert and  oriented x4;  grossly normal neurologically.  Impression/Plan: Kathryn Kramer is here for an colonoscopy to be performed for a history of adenomatous polyps on 2020   Risks, benefits, limitations, and alternatives regarding  colonoscopy have been reviewed with the patient.  Questions have been answered.  All parties agreeable.   Rogelia Copping, MD  12/15/2023, 8:01 AM

## 2023-12-15 NOTE — Transfer of Care (Signed)
 Immediate Anesthesia Transfer of Care Note  Patient: Kathryn Kramer  Procedure(s) Performed: COLONOSCOPY POLYPECTOMY, INTESTINE  Patient Location: PACU  Anesthesia Type:MAC  Level of Consciousness: sedated  Airway & Oxygen Therapy: Patient Spontanous Breathing  Post-op Assessment: Report given to RN and Post -op Vital signs reviewed and stable  Post vital signs: stable  Last Vitals:  Vitals Value Taken Time  BP 130/69 12/15/23 08:33  Temp    Pulse 81 12/15/23 08:33  Resp 16 12/15/23 08:33  SpO2 100 % 12/15/23 08:33  Vitals shown include unfiled device data.  Last Pain:  Vitals:   12/15/23 0731  TempSrc: Temporal  PainSc: 0-No pain         Complications: There were no known notable events for this encounter.

## 2023-12-15 NOTE — Telephone Encounter (Signed)
 poor prep. Needs repeat colonscopy

## 2023-12-15 NOTE — Op Note (Signed)
 [facility] Patient Name: Kathryn Kramer Procedure Date: 12/15/2023 8:03 AM MRN: [id number] Account #: [id number] Date of Birth: 1950/12/28 Admit Type: Outpatient Age: 73 Room: [facility] ENDO ROOM 4 Gender: Female Note Status: Finalized Instrument Name: Colon Scope [id number] Procedure:             Colonoscopy Indications:           High risk colon cancer surveillance: Personal history                         of colonic polyps Providers:             Rogelia Copping MD, MD Referring MD:          Carmella Junk Medicines:             Propofol  per Anesthesia Complications:         No immediate complications. Procedure:             Pre-Anesthesia Assessment:                        - Prior to the procedure, a History and Physical was                         performed, and patient medications and allergies were                         reviewed. The patient's tolerance of previous                         anesthesia was also reviewed. The risks and benefits                         of the procedure and the sedation options and risks                         were discussed with the patient. All questions were                         answered, and informed consent was obtained. Prior                         Anticoagulants: The patient has taken no anticoagulant                         or antiplatelet agents. ASA Grade Assessment: II - A                         patient with mild systemic disease. After reviewing                         the risks and benefits, the patient was deemed in                         satisfactory condition to undergo the procedure.                        After obtaining informed consent, the colonoscope was  passed under direct vision. Throughout the procedure,                         the patient's blood pressure, pulse, and oxygen                         saturations were monitored continuously. The                          Colonoscope was introduced through the anus with the                         intention of advancing to the cecum. The scope was                         advanced to the descending colon before the procedure                         was aborted. Medications were given. The colonoscopy                         was performed without difficulty. The patient                         tolerated the procedure well. The quality of the bowel                         preparation was inadequate. Findings:      The perianal and digital rectal examinations were normal.      A 5 mm polyp was found in the sigmoid colon. The polyp was sessile. The       polyp was removed with a cold snare. Resection and retrieval were       complete.      Multiple small-mouthed diverticula were found in the sigmoid colon.      A large amount of stool was found in the entire colon, precluding       visualization. Impression:            - Preparation of the colon was inadequate.                        - One 5 mm polyp in the sigmoid colon, removed with a                         cold snare. Resected and retrieved.                        - Diverticulosis in the sigmoid colon.                        - Stool in the entire examined colon. Recommendation:        - Discharge patient to home.                        - Resume previous diet.                        - Continue present medications.                        -  Await pathology results.                        - Repeat colonoscopy because the bowel preparation was                         poor. Procedure Code(s):     --- Professional ---                        [phone], 52, Colonoscopy, flexible; with removal of                         tumor(s), polyp(s), or other lesion(s) by snare                         technique Diagnosis Code(s):     --- Professional ---                        Z86.010, Personal history of colonic polyps                        D12.5, Benign neoplasm of sigmoid  colon CPT copyright 2022 [employer]. All rights reserved. The codes documented in this report are preliminary and upon coder review may  be revised to meet current compliance requirements. Rogelia Copping MD, MD 12/15/2023 8:30:11 AM This report has been signed electronically. Number of Addenda: 0 Note Initiated On: 12/15/2023 8:03 AM Total Procedure Duration: 0 hours 10 minutes 43 seconds  Estimated Blood Loss:  Estimated blood loss: none.      [facility]

## 2023-12-16 ENCOUNTER — Encounter: Payer: Self-pay | Admitting: Gastroenterology

## 2023-12-17 LAB — SURGICAL PATHOLOGY

## 2023-12-18 ENCOUNTER — Ambulatory Visit: Payer: Self-pay | Admitting: Gastroenterology

## 2024-01-01 ENCOUNTER — Other Ambulatory Visit: Payer: Self-pay

## 2024-01-01 DIAGNOSIS — Z860100 Personal history of colon polyps, unspecified: Secondary | ICD-10-CM

## 2024-01-01 MED ORDER — PEG 3350-KCL-NABCB-NACL-NASULF 236 G PO SOLR: 4000.0000 mL | Freq: Once | ORAL | 0 refills | Status: AC

## 2024-01-01 NOTE — Telephone Encounter (Signed)
Repeat colonoscopy scheduled

## 2024-03-08 ENCOUNTER — Encounter: Payer: Self-pay | Admitting: Gastroenterology

## 2024-03-09 ENCOUNTER — Ambulatory Visit
Admission: RE | Admit: 2024-03-09 | Discharge: 2024-03-09 | Disposition: A | Discharge status: Home / Self Care | Attending: Gastroenterology | Admitting: Gastroenterology

## 2024-03-09 ENCOUNTER — Encounter
Admission: RE | Disposition: A | Discharge status: Home / Self Care | Payer: Self-pay | Source: Home / Self Care | Attending: Gastroenterology

## 2024-03-09 DIAGNOSIS — Z860100 Personal history of colon polyps, unspecified: Secondary | ICD-10-CM

## 2024-03-09 MED ORDER — SODIUM CHLORIDE 0.9 % IV SOLN: INTRAVENOUS | Status: DC

## 2024-03-09 NOTE — OR Nursing (Signed)
 Pt in preop, BM still soft, runny, brown. Pt throwing up clear liquid in preop.  Says she was throwing up during her prep as well. Cancelled pt and will have office reach out to r/s. Pt on Ozempic says last dose was about a week ago. Injects on Mondays
